# Patient Record
Sex: Female | Born: 1975 | Race: Black or African American | Hispanic: No | Marital: Married | Smoking: Never smoker
Health system: Southern US, Community
[De-identification: ages and names within clinical notes are randomized; demographics above are authoritative.]

## PROBLEM LIST (undated history)

## (undated) DIAGNOSIS — F319 Bipolar disorder, unspecified: Secondary | ICD-10-CM

## (undated) DIAGNOSIS — E119 Type 2 diabetes mellitus without complications: Secondary | ICD-10-CM

## (undated) HISTORY — PX: CERVICAL ABLATION: SHX5771

## (undated) HISTORY — PX: OTHER SURGICAL HISTORY: SHX169

## (undated) HISTORY — PX: DENTAL SURGERY: SHX609

---

## 1997-11-28 ENCOUNTER — Emergency Department (HOSPITAL_COMMUNITY): Admission: EM | Admit: 1997-11-28 | Discharge: 1997-11-28 | Payer: Self-pay

## 2000-08-05 ENCOUNTER — Emergency Department (HOSPITAL_COMMUNITY): Admission: EM | Admit: 2000-08-05 | Discharge: 2000-08-05 | Payer: Self-pay | Admitting: Gastroenterology

## 2000-08-05 ENCOUNTER — Encounter: Payer: Self-pay | Admitting: Emergency Medicine

## 2001-02-16 ENCOUNTER — Other Ambulatory Visit: Admission: RE | Admit: 2001-02-16 | Discharge: 2001-02-16 | Payer: Self-pay | Admitting: Obstetrics and Gynecology

## 2006-08-26 ENCOUNTER — Emergency Department (HOSPITAL_COMMUNITY): Admission: EM | Admit: 2006-08-26 | Discharge: 2006-08-26 | Payer: Self-pay | Admitting: Family Medicine

## 2009-02-24 ENCOUNTER — Emergency Department (HOSPITAL_COMMUNITY): Admission: EM | Admit: 2009-02-24 | Discharge: 2009-02-24 | Payer: Self-pay | Admitting: Emergency Medicine

## 2009-02-25 ENCOUNTER — Emergency Department (HOSPITAL_COMMUNITY): Admission: EM | Admit: 2009-02-25 | Discharge: 2009-02-25 | Payer: Self-pay | Admitting: Emergency Medicine

## 2010-05-05 LAB — URINALYSIS, ROUTINE W REFLEX MICROSCOPIC
Glucose, UA: NEGATIVE mg/dL
Hgb urine dipstick: NEGATIVE
Specific Gravity, Urine: 1.035 — ABNORMAL HIGH (ref 1.005–1.030)
pH: 6 (ref 5.0–8.0)

## 2010-05-05 LAB — DIFFERENTIAL
Basophils Absolute: 0.1 10*3/uL (ref 0.0–0.1)
Basophils Relative: 1 % (ref 0–1)
Eosinophils Absolute: 0 10*3/uL (ref 0.0–0.7)
Eosinophils Relative: 1 % (ref 0–5)
Lymphocytes Relative: 22 % (ref 12–46)
Lymphs Abs: 1.7 10*3/uL (ref 0.7–4.0)
Monocytes Absolute: 0.7 10*3/uL (ref 0.1–1.0)
Monocytes Relative: 9 % (ref 3–12)
Neutro Abs: 5.3 10*3/uL (ref 1.7–7.7)
Neutrophils Relative %: 68 % (ref 43–77)

## 2010-05-05 LAB — BASIC METABOLIC PANEL
BUN: 11 mg/dL (ref 6–23)
CO2: 27 mEq/L (ref 19–32)
Calcium: 9.1 mg/dL (ref 8.4–10.5)
Chloride: 102 mEq/L (ref 96–112)
Creatinine, Ser: 0.67 mg/dL (ref 0.4–1.2)
GFR calc Af Amer: >60 mL/min (ref >60)
GFR calc non Af Amer: >60 mL/min (ref >60)
Glucose, Bld: 105 mg/dL — ABNORMAL HIGH (ref 70–99)
Potassium: 3 mEq/L — ABNORMAL LOW (ref 3.5–5.1)
Sodium: 135 mEq/L (ref 135–145)

## 2010-05-05 LAB — URINE MICROSCOPIC-ADD ON

## 2010-05-05 LAB — CBC
HCT: 38.2 % (ref 36.0–46.0)
Hemoglobin: 12.6 g/dL (ref 12.0–15.0)
MCHC: 33.1 g/dL (ref 30.0–36.0)
MCV: 86.3 fL (ref 78.0–100.0)
Platelets: 285 10*3/uL (ref 150–400)
RBC: 4.43 MIL/uL (ref 3.87–5.11)
RDW: 13.6 % (ref 11.5–15.5)
WBC: 7.8 10*3/uL (ref 4.0–10.5)

## 2010-05-05 LAB — ETHANOL: Alcohol, Ethyl (B): <5 mg/dL (ref 0–10)

## 2010-05-05 LAB — RAPID URINE DRUG SCREEN, HOSP PERFORMED
Barbiturates: NOT DETECTED
Opiates: NOT DETECTED
Tetrahydrocannabinol: NOT DETECTED

## 2013-02-06 ENCOUNTER — Encounter (HOSPITAL_COMMUNITY): Payer: Self-pay | Admitting: Emergency Medicine

## 2013-02-06 ENCOUNTER — Emergency Department (HOSPITAL_COMMUNITY)
Admission: EM | Admit: 2013-02-06 | Discharge: 2013-02-06 | Disposition: A | Discharge status: Home / Self Care | Payer: No Typology Code available for payment source | Attending: Emergency Medicine | Admitting: Emergency Medicine

## 2013-02-06 ENCOUNTER — Emergency Department (HOSPITAL_COMMUNITY): Payer: No Typology Code available for payment source

## 2013-02-06 DIAGNOSIS — M25511 Pain in right shoulder: Secondary | ICD-10-CM

## 2013-02-06 DIAGNOSIS — S4980XA Other specified injuries of shoulder and upper arm, unspecified arm, initial encounter: Secondary | ICD-10-CM | POA: Insufficient documentation

## 2013-02-06 DIAGNOSIS — S46909A Unspecified injury of unspecified muscle, fascia and tendon at shoulder and upper arm level, unspecified arm, initial encounter: Secondary | ICD-10-CM | POA: Insufficient documentation

## 2013-02-06 DIAGNOSIS — Y92410 Unspecified street and highway as the place of occurrence of the external cause: Secondary | ICD-10-CM | POA: Insufficient documentation

## 2013-02-06 DIAGNOSIS — Y9389 Activity, other specified: Secondary | ICD-10-CM | POA: Insufficient documentation

## 2013-02-06 MED ORDER — HYDROCODONE-ACETAMINOPHEN 5-325 MG PO TABS
2.0000 | ORAL_TABLET | Freq: Once | ORAL | Status: AC
  Administered 2013-02-06: 2 via ORAL
  Filled 2013-02-06: qty 2

## 2013-02-06 MED ORDER — HYDROCODONE-ACETAMINOPHEN 5-325 MG PO TABS: 1.0000 | ORAL_TABLET | ORAL | Status: DC | PRN

## 2013-02-06 MED ORDER — CYCLOBENZAPRINE HCL 5 MG PO TABS: 5.0000 mg | ORAL_TABLET | Freq: Two times a day (BID) | ORAL | Status: DC | PRN
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
## 2013-02-11 NOTE — ED Provider Notes (Signed)
Medical screening examination/treatment/procedure(s) were performed by non-physician practitioner and as supervising physician I was immediately available for consultation/collaboration.  EKG Interpretation   None         Christopher J. Pollina, MD 02/11/13 0708 

## 2014-04-14 ENCOUNTER — Other Ambulatory Visit: Payer: Self-pay | Admitting: Obstetrics and Gynecology

## 2014-04-14 DIAGNOSIS — Z1231 Encounter for screening mammogram for malignant neoplasm of breast: Secondary | ICD-10-CM

## 2014-04-21 ENCOUNTER — Ambulatory Visit
Admission: RE | Admit: 2014-04-21 | Discharge: 2014-04-21 | Disposition: A | Discharge status: Home / Self Care | Payer: BC Managed Care – PPO | Source: Ambulatory Visit | Attending: Obstetrics and Gynecology | Admitting: Obstetrics and Gynecology

## 2014-04-21 DIAGNOSIS — Z1231 Encounter for screening mammogram for malignant neoplasm of breast: Secondary | ICD-10-CM

## 2015-09-12 ENCOUNTER — Encounter: Payer: Self-pay | Admitting: *Deleted

## 2015-09-12 ENCOUNTER — Encounter: Payer: BC Managed Care – PPO | Attending: *Deleted | Admitting: *Deleted

## 2015-09-12 DIAGNOSIS — E119 Type 2 diabetes mellitus without complications: Secondary | ICD-10-CM | POA: Insufficient documentation

## 2015-09-12 NOTE — Patient Instructions (Signed)
Plan:  Aim for 2 Carb Choices per meal (30 grams) +/- 1 either way  Aim for 0-1 Carbs per snack if hungry  Include protein in moderation with your meals and snacks Consider reading food labels for Total Carbohydrate of foods Continue with your activity level as tolerated Consider checking BG at alternate times per day if you choose to pursue that

## 2015-09-14 NOTE — Progress Notes (Signed)
Diabetes Self-Management Education  Visit Type: First/Initial  Appt. Start Time: 1000 Appt. End Time: 1130  09/14/2015  Laura Thompson, identified by name and date of birth, is a 40 y.o. female with a diagnosis of Diabetes: Type 2. She is newly diagnosed with DM 2. Had recent weight gain with birh control method over the past year. Since diagnoxed, she has increased her vegetable intake and is trying to eat healthier. She is also not eating out as frequently and has increased her activity level.  ASSESSMENT  Height  (1.6 m), weight 193 lb 6.4 oz (87.7 kg). Body mass index is 34.26 kg/m.      Diabetes Self-Management Education - 09/12/15 1201      Visit Information   Visit Type First/Initial     Initial Visit   Diabetes Type Type 2   Are you currently following a meal plan? Yes   What type of meal plan do you follow? better choices   Are you taking your medications as prescribed? Not on Medications   Date Diagnosed May, 2017     Health Coping   How would you rate your overall health? Good     Psychosocial Assessment   Patient Belief/Attitude about Diabetes Afraid   Self-care barriers None   Self-management support Family   Other persons present Patient   Special Needs None   Preferred Learning Style Auditory;Visual;Hands on   Learning Readiness Change in progress   How often do you need to have someone help you when you read instructions, pamphlets, or other written materials from your doctor or pharmacy? 1 - Never   What is the last grade level you completed in school? college     Pre-Education Assessment   Patient understands the diabetes disease and treatment process. Needs Instruction   Patient understands incorporating nutritional management into lifestyle. Needs Instruction   Patient undertands incorporating physical activity into lifestyle. Needs Instruction   Patient understands using medications safely. Needs Instruction   Patient understands  monitoring blood glucose, interpreting and using results Needs Instruction   Patient understands prevention, detection, and treatment of acute complications. Needs Instruction   Patient understands prevention, detection, and treatment of chronic complications. Needs Instruction   Patient understands how to develop strategies to address psychosocial issues. Needs Instruction   Patient understands how to develop strategies to promote health/change behavior. Needs Instruction     Complications   Last HgB A1C per patient/outside source 7.3 %   How often do you check your blood sugar? Not recommended by provider   Have you had a dilated eye exam in the past 12 months? No   Have you had a dental exam in the past 12 months? Yes   Are you checking your feet? No     Dietary Intake   Breakfast oatmeal with almond milk   Snack (morning) nuts, fresh fruit   Lunch hot meal, lean meat, vegetables, occasional starch   Snack (afternoon) nuts   Dinner salad occasionally with meat, vegetable plate   Beverage(s) water     Exercise   Exercise Type Light (walking / raking leaves)   How many days per week to you exercise? 3.5   How many minutes per day do you exercise? 45   Total minutes per week of exercise 157.5     Patient Education   Previous Diabetes Education No   Disease state  Definition of diabetes, type 1 and 2, and the diagnosis of diabetes   Nutrition management  Role of diet in the treatment of diabetes and the relationship between the three main macronutrients and blood glucose level;Food label reading, portion sizes and measuring food.;Carbohydrate counting   Physical activity and exercise  Role of exercise on diabetes management, blood pressure control and cardiac health.   Monitoring Purpose and frequency of SMBG.;Identified appropriate SMBG and/or A1C goals.   Chronic complications Relationship between chronic complications and blood glucose control     Individualized Goals (developed  by patient)   Nutrition Follow meal plan discussed   Physical Activity Exercise 3-5 times per week   Medications Not Applicable   Monitoring  Not Applicable     Post-Education Assessment   Patient understands the diabetes disease and treatment process. Demonstrates understanding / competency   Patient understands incorporating nutritional management into lifestyle. Demonstrates understanding / competency   Patient undertands incorporating physical activity into lifestyle. Demonstrates understanding / competency   Patient understands monitoring blood glucose, interpreting and using results --  not ready to start testing yet   Patient understands prevention, detection, and treatment of acute complications. --  NA   Patient understands prevention, detection, and treatment of chronic complications. Demonstrates understanding / competency   Patient understands how to develop strategies to address psychosocial issues. Demonstrates understanding / competency     Outcomes   Expected Outcomes Demonstrated interest in learning. Expect positive outcomes   Future DMSE PRN   Program Status Completed      Individualized Plan for Diabetes Self-Management Training:   Learning Objective:  Patient will have a greater understanding of diabetes self-management. Patient education plan is to attend individual and/or group sessions per assessed needs and concerns.   Plan:   Patient Instructions  Plan:  Aim for 2 Carb Choices per meal (30 grams) +/- 1 either way  Aim for 0-1 Carbs per snack if hungry  Include protein in moderation with your meals and snacks Consider reading food labels for Total Carbohydrate of foods Continue with your activity level as tolerated Consider checking BG at alternate times per day if you choose to pursue that       Expected Outcomes:  Demonstrated interest in learning. Expect positive outcomes  Education material provided: Living Well with Diabetes, A1C conversion  sheet, Meal plan card and Carbohydrate counting sheet  If problems or questions, patient to contact team via:  Phone and Email  Future DSME appointment: PRN

## 2015-10-15 ENCOUNTER — Encounter: Payer: Self-pay | Admitting: Physician Assistant

## 2016-06-25 MED FILL — LORYNA 3-0.02 MG TAB: 3-0.02 | 84 days supply | Qty: 84 | Fill #0

## 2016-07-30 ENCOUNTER — Ambulatory Visit (HOSPITAL_COMMUNITY)
Admission: EM | Admit: 2016-07-30 | Discharge: 2016-07-30 | Disposition: A | Discharge status: Home / Self Care | Payer: Self-pay | Attending: Family Medicine | Admitting: Family Medicine

## 2016-07-30 ENCOUNTER — Encounter (HOSPITAL_COMMUNITY): Payer: Self-pay | Admitting: Emergency Medicine

## 2016-07-30 DIAGNOSIS — J302 Other seasonal allergic rhinitis: Secondary | ICD-10-CM

## 2016-07-30 DIAGNOSIS — B9789 Other viral agents as the cause of diseases classified elsewhere: Secondary | ICD-10-CM

## 2016-07-30 DIAGNOSIS — J069 Acute upper respiratory infection, unspecified: Secondary | ICD-10-CM

## 2016-07-30 MED ORDER — DM-GUAIFENESIN ER 30-600 MG PO TB12: 1.0000 | ORAL_TABLET | Freq: Two times a day (BID) | ORAL | 0 refills | Status: DC

## 2016-07-30 MED ORDER — FLUTICASONE PROPIONATE 50 MCG/ACT NA SUSP: 1.0000 | Freq: Every day | NASAL | 1 refills | Status: DC

## 2016-07-30 MED ORDER — FEXOFENADINE HCL 60 MG PO TABS: 180.0000 mg | ORAL_TABLET | Freq: Every day | ORAL | 0 refills | Status: DC

## 2016-07-30 MED FILL — FLUTICASONE PROP 50 MCG SPR: 50 | 60 days supply | Qty: 16 | Fill #0

## 2016-07-30 NOTE — Discharge Instructions (Signed)
Nasal saline rinse as advised

## 2016-07-30 NOTE — ED Triage Notes (Signed)
[facility] a [facility] of [facility] sore throat, cough and sinus pain and pressure x 2 days.

## 2016-07-30 NOTE — ED Provider Notes (Signed)
CSN: [id number]     Arrival date & time 07/30/16  1631 History   None    Chief Complaint  Patient presents with  . Cough   (Consider location/radiation/quality/duration/timing/severity/associated sxs/prior Treatment) The history is provided by the patient.  Cough  Cough characteristics:  Productive Sputum characteristics:  Yellow and clear Severity:  Moderate Timing:  Intermittent Associated symptoms: rhinorrhea, sinus congestion and sore throat   : 41 y/o female presented with CC nasal congestion, cough (productive), post nasal drip, sore throat and sneezing x 3 days. Pt denies fever/chills/SOB.   History reviewed. No pertinent past medical history. History reviewed. No pertinent surgical history. History reviewed. No pertinent family history. Social History  Substance Use Topics  . Smoking status: Never Smoker  . Smokeless tobacco: Never Used  . Alcohol use No   OB History    No data available     Review of Systems  Constitutional: Negative.   HENT: Positive for postnasal drip, rhinorrhea, sinus pressure and sore throat.   Respiratory: Positive for cough.     Allergies  Doxycycline; Iodinated diagnostic agents; Septra [sulfamethoxazole-trimethoprim]; and Sulfa antibiotics  Home Medications   Prior to Admission medications   Medication Sig Start Date End Date Taking? Authorizing Provider  norelgestromin-ethinyl estradiol (ORTHO EVRA) 150-35 MCG/24HR transdermal patch Place 1 patch onto the skin once a week.   Yes [provider]  dextromethorphan-guaiFENesin (MUCINEX DM) 30-600 MG 12hr tablet Take 1 tablet by mouth 2 (two) times daily. 07/30/16   Jemiah Ellenburg, NP  fexofenadine (ALLEGRA) 60 MG tablet Take 3 tablets (180 mg total) by mouth daily. 07/30/16   Female Iafrate, NP  fluticasone (FLONASE ALLERGY RELIEF) 50 MCG/ACT nasal spray Place 1 spray into both nostrils daily. 07/30/16   Shadeed Colberg, NP   Meds Ordered and Administered this Visit   Medications - No data to display  BP 113/74 (BP Location: Right Arm)   Pulse 97   Temp 98.2 F (36.8 C) (Oral)   Resp 18   SpO2 99%  No data found.   Physical Exam  Constitutional: She appears well-developed and well-nourished. No distress.  HENT:  Head: Normocephalic.  Right Ear: External ear normal.  Left Ear: External ear normal.  Nose: Mucosal edema present.  Mouth/Throat: Oropharynx is clear and moist.  B/L nasal mucosal inflammation with inflamed turbinates. +ve post nasal drip  Eyes: Pupils are equal, round, and reactive to light.  Neck: Normal range of motion.  Cardiovascular: Normal rate and regular rhythm.   Pulmonary/Chest: Effort normal and breath sounds normal.  Skin: Skin is warm.  Psychiatric: She has a normal mood and affect.    Urgent Care Course     Procedures (including critical care time)  Labs Review Labs Reviewed - No data to display  Imaging Review No results found.   Visual Acuity Review  Right Eye Distance:   Left Eye Distance:   Bilateral Distance:    Right Eye Near:   Left Eye Near:    Bilateral Near:         MDM   1. Viral URI with cough   2. Seasonal allergic rhinitis, unspecified trigger   Sx highly likely of viral cause with superimposed allergies. Advised to start Rx as advised and saline nasal rinse OTC BID    Samarion Ehle, NP 07/30/16 1727

## 2016-12-15 MED FILL — DROSPIR-ETH ESTRA 3/.02 MG: 3-0.02 | 28 days supply | Qty: 28 | Fill #1

## 2017-01-13 MED FILL — DROSPIR-ETH ESTRA 3/.02 MG: 3-0.02 | 28 days supply | Qty: 28 | Fill #2

## 2017-02-13 MED FILL — DROSPIR-ETH ESTRA 3/.02 MG: 3-0.02 | 28 days supply | Qty: 28 | Fill #3

## 2017-03-09 ENCOUNTER — Other Ambulatory Visit: Payer: Self-pay

## 2017-03-09 ENCOUNTER — Emergency Department (HOSPITAL_COMMUNITY)
Admission: EM | Admit: 2017-03-09 | Discharge: 2017-03-10 | Disposition: A | Discharge status: Other Acute Inpatient Hospital | Payer: Federal, State, Local not specified - Other | Attending: Emergency Medicine | Admitting: Emergency Medicine

## 2017-03-09 ENCOUNTER — Encounter (HOSPITAL_COMMUNITY): Payer: Self-pay | Admitting: Nurse Practitioner

## 2017-03-09 ENCOUNTER — Ambulatory Visit (HOSPITAL_COMMUNITY)
Admission: EM | Admit: 2017-03-09 | Discharge: 2017-03-09 | Disposition: A | Discharge status: Home / Self Care | Payer: BC Managed Care – PPO

## 2017-03-09 DIAGNOSIS — F312 Bipolar disorder, current episode manic severe with psychotic features: Secondary | ICD-10-CM

## 2017-03-09 DIAGNOSIS — Z79899 Other long term (current) drug therapy: Secondary | ICD-10-CM | POA: Insufficient documentation

## 2017-03-09 DIAGNOSIS — F309 Manic episode, unspecified: Secondary | ICD-10-CM

## 2017-03-09 LAB — I-STAT BETA HCG BLOOD, ED (MC, WL, AP ONLY)

## 2017-03-09 LAB — COMPREHENSIVE METABOLIC PANEL
ALBUMIN: 3.9 g/dL (ref 3.5–5.0)
ALT: 21 U/L (ref 14–54)
AST: 26 U/L (ref 15–41)
Alkaline Phosphatase: 46 U/L (ref 38–126)
Anion gap: 9 (ref 5–15)
BUN: 8 mg/dL (ref 6–20)
CHLORIDE: 103 mmol/L (ref 101–111)
CO2: 23 mmol/L (ref 22–32)
Calcium: 9.3 mg/dL (ref 8.9–10.3)
Creatinine, Ser: 0.62 mg/dL (ref 0.44–1.00)
GFR calc Af Amer: >60 mL/min (ref >60)
GFR calc non Af Amer: >60 mL/min (ref >60)
GLUCOSE: 159 mg/dL — AB (ref 65–99)
POTASSIUM: 3 mmol/L — AB (ref 3.5–5.1)
SODIUM: 135 mmol/L (ref 135–145)
Total Bilirubin: 1.1 mg/dL (ref 0.3–1.2)
Total Protein: 7.8 g/dL (ref 6.5–8.1)

## 2017-03-09 LAB — RAPID URINE DRUG SCREEN, HOSP PERFORMED
Amphetamines: NOT DETECTED
BARBITURATES: NOT DETECTED
BENZODIAZEPINES: NOT DETECTED
COCAINE: NOT DETECTED
Opiates: NOT DETECTED
Tetrahydrocannabinol: NOT DETECTED

## 2017-03-09 LAB — CBC
HCT: 37.9 % (ref 36.0–46.0)
Hemoglobin: 12.4 g/dL (ref 12.0–15.0)
MCH: 26.7 pg (ref 26.0–34.0)
MCHC: 32.7 g/dL (ref 30.0–36.0)
MCV: 81.7 fL (ref 78.0–100.0)
PLATELETS: 339 10*3/uL (ref 150–400)
RBC: 4.64 MIL/uL (ref 3.87–5.11)
RDW: 13.4 % (ref 11.5–15.5)
WBC: 5.8 10*3/uL (ref 4.0–10.5)

## 2017-03-09 LAB — ETHANOL: Alcohol, Ethyl (B): <10 mg/dL (ref <10)

## 2017-03-09 MED ORDER — IBUPROFEN 200 MG PO TABS: 600.0000 mg | ORAL_TABLET | Freq: Three times a day (TID) | ORAL | Status: DC | PRN

## 2017-03-09 MED ORDER — OLANZAPINE 10 MG PO TBDP
10.0000 mg | ORAL_TABLET | Freq: Once | ORAL | Status: AC
  Administered 2017-03-09: 10 mg via ORAL
  Filled 2017-03-09: qty 1

## 2017-03-09 MED ORDER — POTASSIUM CHLORIDE CRYS ER 20 MEQ PO TBCR
40.0000 meq | EXTENDED_RELEASE_TABLET | Freq: Every day | ORAL | Status: DC
  Administered 2017-03-09 – 2017-03-10 (×2): 40 meq via ORAL
  Filled 2017-03-09 (×2): qty 2

## 2017-03-09 MED ORDER — ONDANSETRON HCL 4 MG PO TABS: 4.0000 mg | ORAL_TABLET | Freq: Three times a day (TID) | ORAL | Status: DC | PRN

## 2017-03-09 NOTE — ED Provider Notes (Signed)
[facility]-EMERGENCY DEPT Provider Note   CSN: [id number] Arrival date & time: 03/09/17  1111     History   Chief Complaint No chief complaint on file.  Level V caveat: mental health illness HPI Laura Thompson is a 42 y.o. female.  HPI 42 yo female with a hx of bipolar disorder not on meds presents with several days with increased manic behavior, rapid speech, poor sleep. Brought to ER by her husband. No new meds. No other complaints History reviewed. No pertinent past medical history.  There are no active problems to display for this patient.   History reviewed. No pertinent surgical history.  OB History    No data available       Home Medications    Prior to Admission medications   Medication Sig Start Date End Date Taking? Authorizing Provider  dextromethorphan-guaiFENesin (MUCINEX DM) 30-600 MG 12hr tablet Take 1 tablet by mouth 2 (two) times daily. 07/30/16   Multani, Bhupinder, NP  fexofenadine (ALLEGRA) 60 MG tablet Take 3 tablets (180 mg total) by mouth daily. 07/30/16   Multani, Bhupinder, NP  fluticasone (FLONASE ALLERGY RELIEF) 50 MCG/ACT nasal spray Place 1 spray into both nostrils daily. 07/30/16   Multani, Bhupinder, NP  norelgestromin-ethinyl estradiol (ORTHO EVRA) 150-35 MCG/24HR transdermal patch Place 1 patch onto the skin once a week.    [provider]    Family History History reviewed. No pertinent family history.  Social History Social History   Tobacco Use  . Smoking status: Never Smoker  . Smokeless tobacco: Never Used  Substance Use Topics  . Alcohol use: No  . Drug use: No     Allergies   Doxycycline; Iodinated diagnostic agents; Septra [sulfamethoxazole-trimethoprim]; and Sulfa antibiotics   Review of Systems Review of Systems  Unable to perform ROS: Psychiatric disorder     Physical Exam Updated Vital Signs BP (!) 142/93   Pulse 88   Temp 97.9 F (36.6 C) (Oral)   Resp 18   Ht 5' 3.5"  (1.613 m)   Wt 81.6 kg (180 lb)   SpO2 100%   BMI 31.39 kg/m   Physical Exam  Constitutional: She is oriented to person, place, and time. She appears well-developed and well-nourished.  HENT:  Head: Normocephalic.  Eyes: EOM are normal.  Neck: Normal range of motion.  Cardiovascular: Normal rate.  Pulmonary/Chest: Effort normal and breath sounds normal.  Abdominal: She exhibits no distension.  Musculoskeletal: Normal range of motion.  Neurological: She is alert and oriented to person, place, and time.  Psychiatric: Her affect is blunt. Her speech is rapid and/or pressured. She is hyperactive. Thought content is not paranoid. Cognition and memory are impaired. She expresses no homicidal and no suicidal ideation. She is inattentive.  Nursing note and vitals reviewed.    ED Treatments / Results  Labs (all labs ordered are listed, but only abnormal results are displayed) Labs Reviewed  COMPREHENSIVE METABOLIC PANEL - Abnormal; Notable for the following components:      Result Value   Potassium 3.0 (*)    Glucose, Bld 159 (*)    All other components within normal limits  CBC  ETHANOL  RAPID URINE DRUG SCREEN, HOSP PERFORMED  I-STAT BETA HCG BLOOD, ED ([facility], [facility], [facility] ONLY)    EKG  EKG Interpretation None       Radiology No results found.  Procedures Procedures (including critical care time)  Medications Ordered in ED Medications  ibuprofen (ADVIL,MOTRIN) tablet 600 mg (not administered)  ondansetron (ZOFRAN) tablet 4 mg (not administered)  OLANZapine zydis (ZYPREXA) disintegrating tablet 10 mg (10 mg Oral Given 03/09/17 1304)     Initial Impression / Assessment and Plan / ED Course  I have reviewed the triage vital signs and the nursing notes.  Pertinent labs & imaging results that were available during my care of the patient were reviewed by me and considered in my medical decision making (see chart for details).     Medically clear. TTS to evaluate. Oral K  replacement.  hypoK unrelated to her acute mental health illlness  Final Clinical Impressions(s) / ED Diagnoses   Final diagnoses:  None    ED Discharge Orders    None       Azalia Bilisampos, Tymarion Everard, MD 03/09/17 1601

## 2017-03-09 NOTE — BH Assessment (Addendum)
[facility] Assessment Progress Note Sharma CovertNorman MD evaluated patient and recommended a inpatient admission. IVC to be initiated.

## 2017-03-09 NOTE — ED Notes (Signed)
ED Provider at bedside. 

## 2017-03-09 NOTE — BH Assessment (Signed)
[facility] Assessment Progress Note  Laura CovertNorman MD evaluated patient and recommended a inpatient admission. IVC pending.

## 2017-03-09 NOTE — BH Assessment (Addendum)
Assessment Note  Laura Thompson is an 42 y.o. female that presents this date with symptoms associated with a Bipolar episode as patient is observed to be manic and very liable. Patient is unable to render a accurate history due to current mental health state. Patient is pacing around the room and dancing on her bed. Patient can be redirected with prompts but quickly reverts back to manic state and is observed to be laughing then crying as this Clinical research associate attempts to conduct assessment. Patient's husband is present (with patient's permission) Laura Thompson who assists with rendering collateral information. Patient states she recently presented to a local Urgent Care last Sunday 03/07/17 and was prescribed medications for plantar fasciitis that husband feels triggered this current manic episode. Patient states she was prescribed Tramadol  and Prednisone although patient renders conflicting history on when she last took those medications. Patient's husband is also unsure when patient last took those medications. Patient states she was diagnosed with being Bipolar seven years ago at [facility] but was transferred to another facility where she received medication management for two weeks. Patient is unclear who that provider was or what medications she received. Patient states she did not follow up with aftercare or medication interventions and has not had a manic episode until yesterday when symptoms escalated into current state. Patient denies any current/past history of SA use. Patient is oriented to time/place and denies any AVH. Patient denies any S/I, H/I or AVH. Patient denies any thoughts of self harm or prior attempts/gestures at self injurious behaviors. Patient is agreeing to be monitored overnight and is requesting medication management to assist with current symptoms. Patient states she has not slept in the last 24 hours. Patient states she "is just staying over night and she might change her mind." Per  admission notes, patient brought in by husband for current manic state. Patient is not under the care of a current provider. Patient is not on any medications for her Bipolar disorder. Patient has not ate in 1 day. Patients is talking rapid and incoherently at times. Patient states she "cant control her thoughts. She is tearful and aggressive. Sharma Covert MD evaluated patient and recommended a inpatient admission. IVC to be initiated.      Diagnosis: F31.1 Bipolar disorder, current episode manic without psychotic features.   Past Medical History: History reviewed. No pertinent past medical history.  History reviewed. No pertinent surgical history.  Family History: History reviewed. No pertinent family history.  Social History:  reports that  has never smoked. she has never used smokeless tobacco. She reports that she does not drink alcohol or use drugs.  Additional Social History:  Alcohol / Drug Use Pain Medications: See MAR Prescriptions: See MAR Over the Counter: See MAR History of alcohol / drug use?: No history of alcohol / drug abuse Longest period of sobriety (when/how long): (Denies) Negative Consequences of Use: (Denies) Withdrawal Symptoms: (Denies)  CIWA: CIWA-Ar BP: (!) 142/93 Pulse Rate: 88 COWS:    Allergies:  Allergies  Allergen Reactions  . Doxycycline   . Iodinated Diagnostic Agents   . Septra [Sulfamethoxazole-Trimethoprim]   . Sulfa Antibiotics     Home Medications:  (Not in a hospital admission)  OB/GYN Status:  No LMP recorded. Patient is not currently having periods (Reason: Other).  General Assessment Data [facility] of Assessment: [facility] TTS Assessment: In system Is this a Tele or Face-to-Face Assessment?: Face-to-Face Is this an Initial Assessment or a Re-assessment for this encounter?: Initial Assessment Marital status: Married  Maiden name: NA Is patient pregnant?: No Pregnancy Status: No Living Arrangements: Spouse/significant other Can pt return  to current living arrangement?: Yes Admission Status: Voluntary Is patient capable of signing voluntary admission?: Yes Referral Source: Self/Family/Friend Insurance type: Self Pay  Medical Screening Exam [facility] Walk-in ONLY) Medical Exam completed: Yes  Crisis Care Plan Living Arrangements: Spouse/significant other Legal Guardian: (NA) Name of Psychiatrist: None Name of Therapist: None  Education Status Is patient currently in school?: No Current Grade: (NA) 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 : No Suicidal Intent: No Has patient had any suicidal intent within the past 6 months prior to admission? : No Is patient at risk for suicide?: No Suicidal Plan?: No Has patient had any suicidal plan within the past 6 months prior to admission? : No Access to Means: No What has been your use of drugs/alcohol within the last 12 months?: Denies Previous Attempts/Gestures: Yes How many times?: 1 Other Self Harm Risks: NA Triggers for Past Attempts: Unknown Intentional Self Injurious Behavior: None Family Suicide History: No Recent stressful life event(s): Other (Comment)(MH issues being off medications) Persecutory voices/beliefs?: No Depression: No Depression Symptoms: (NA) Substance abuse history and/or treatment for substance abuse?: No Suicide prevention information given to non-admitted patients: Not applicable  Risk to Others within the past 6 months Homicidal Ideation: No Does patient have any lifetime risk of violence toward others beyond the six months prior to admission? : No Thoughts of Harm to Others: No Current Homicidal Intent: No Current Homicidal Plan: No Access to Homicidal Means: No Identified Victim: NA History of harm to others?: No Assessment of Violence: None  Noted Violent Behavior Description: NA Does patient have access to weapons?: No Criminal Charges Pending?: No Does patient have a court date: No Is patient on probation?: No  Psychosis Hallucinations: None noted Delusions: None noted  Mental Status Report Appearance/Hygiene: In scrubs Eye Contact: Fair Motor Activity: Agitation Speech: Pressured, Loud Level of Consciousness: Restless, Combative, Irritable Mood: Anxious, Angry Affect: Angry, Anxious Anxiety Level: Severe Thought Processes: Tangential Judgement: Unimpaired Orientation: Person, Place, Time Obsessive Compulsive Thoughts/Behaviors: None  Cognitive Functioning Concentration: Decreased Memory: Recent Intact IQ: Average Insight: Poor Impulse Control: Poor Appetite: Fair Weight Loss: 0 Weight Gain: 0 Sleep: Decreased Total Hours of Sleep: (Pt has not slept in 24 hours) Vegetative Symptoms: None  ADLScreening [facility] Assessment Services) Patient's cognitive ability adequate to safely complete daily activities?: Yes Patient able to express need for assistance with ADLs?: Yes Independently performs ADLs?: Yes (appropriate for developmental age)  Prior Inpatient Therapy Prior Inpatient Therapy: Yes Prior Therapy Dates: 2011 Prior [facility]Provider(s): [facility]  Reason for Treatment: MH issues  Prior Outpatient Therapy Prior Outpatient Therapy: No Prior Therapy Dates: NA Prior Therapy Facilty/Provider(s): NA Reason for Treatment: NA Does patient have an ACCT team?: No Does patient have Intensive In-House Services?  : No Does patient have [facility] services? : No Does patient have P4CC services?: No  ADL Screening (condition at time of admission) Patient's cognitive ability adequate to safely complete daily activities?: Yes Is the patient deaf or have difficulty hearing?: No Does the patient have difficulty seeing, even when wearing glasses/contacts?: No Does the patient have difficulty  concentrating, remembering, or making decisions?: No Patient able to express need for assistance with ADLs?: Yes Does  the patient have difficulty dressing or bathing?: No Independently performs ADLs?: Yes (appropriate for developmental age) Does the patient have difficulty walking or climbing stairs?: No Weakness of Legs: None Weakness of Arms/Hands: None  Home Assistive Devices/Equipment Home Assistive Devices/Equipment: None  Therapy Consults (therapy consults require a physician order) PT Evaluation Needed: No OT Evalulation Needed: No SLP Evaluation Needed: No Abuse/Neglect Assessment (Assessment to be complete while patient is alone) Physical Abuse: Denies Verbal Abuse: Denies Sexual Abuse: Denies Exploitation of patient/patient's resources: Denies Self-Neglect: Denies Values / Beliefs Cultural Requests During Hospitalization: None Spiritual Requests During Hospitalization: None Consults Spiritual Care Consult Needed: No Social Work Consult Needed: No Merchant navy officerAdvance Directives (For Healthcare) Does Patient Have a Medical Advance Directive?: No Would patient like information on creating a medical advance directive?: No - Patient declined    Additional Information 1:1 In Past 12 Months?: No CIRT Risk: No Elopement Risk: No Does patient have medical clearance?: Yes     Disposition: Sharma CovertNorman MD evaluated patient and recommended a inpatient admission. IVC pending.    Disposition Initial Assessment Completed for this Encounter: Yes Disposition of Patient: Other dispositions Other disposition(s): (Patient will be monitored and observed)  On Site Evaluation by:   Reviewed with Physician:    Alfredia Fergusonavid L Lilyonna Steidle 03/09/2017 2:10 PM

## 2017-03-09 NOTE — ED Triage Notes (Signed)
Patient brought in by husband for and altered manic state. Patient is not under the care of a psych MD. This has happened before she feels manic and has not slept in days. Patient is not on any medications for her bipolar disorder. Patient has not ate in 1 day. Patients is talking rapid and word salad. Cant control her thoughts. She is tearful and aggressive.

## 2017-03-09 NOTE — ED Notes (Signed)
Bed: WA29 Expected date:  Expected time:  Means of arrival:  Comments: 

## 2017-03-10 ENCOUNTER — Inpatient Hospital Stay (HOSPITAL_COMMUNITY)
Admission: AD | Admit: 2017-03-10 | Discharge: 2017-03-13 | DRG: 885 | Disposition: A | Discharge status: Home / Self Care | Payer: Federal, State, Local not specified - Other | Attending: Psychiatry | Admitting: Psychiatry

## 2017-03-10 ENCOUNTER — Encounter (HOSPITAL_COMMUNITY): Payer: Self-pay | Admitting: *Deleted

## 2017-03-10 ENCOUNTER — Other Ambulatory Visit: Payer: Self-pay

## 2017-03-10 DIAGNOSIS — Z882 Allergy status to sulfonamides status: Secondary | ICD-10-CM | POA: Diagnosis not present

## 2017-03-10 DIAGNOSIS — Z23 Encounter for immunization: Secondary | ICD-10-CM | POA: Diagnosis not present

## 2017-03-10 DIAGNOSIS — G4700 Insomnia, unspecified: Secondary | ICD-10-CM

## 2017-03-10 DIAGNOSIS — R4587 Impulsiveness: Secondary | ICD-10-CM

## 2017-03-10 DIAGNOSIS — F419 Anxiety disorder, unspecified: Secondary | ICD-10-CM | POA: Diagnosis present

## 2017-03-10 DIAGNOSIS — F312 Bipolar disorder, current episode manic severe with psychotic features: Secondary | ICD-10-CM

## 2017-03-10 DIAGNOSIS — Z91041 Radiographic dye allergy status: Secondary | ICD-10-CM | POA: Diagnosis not present

## 2017-03-10 DIAGNOSIS — F3113 Bipolar disorder, current episode manic without psychotic features, severe: Secondary | ICD-10-CM | POA: Diagnosis not present

## 2017-03-10 DIAGNOSIS — Z79899 Other long term (current) drug therapy: Secondary | ICD-10-CM

## 2017-03-10 DIAGNOSIS — Z888 Allergy status to other drugs, medicaments and biological substances status: Secondary | ICD-10-CM

## 2017-03-10 DIAGNOSIS — E119 Type 2 diabetes mellitus without complications: Secondary | ICD-10-CM | POA: Diagnosis present

## 2017-03-10 DIAGNOSIS — F3110 Bipolar disorder, current episode manic without psychotic features, unspecified: Secondary | ICD-10-CM | POA: Diagnosis present

## 2017-03-10 HISTORY — DX: Type 2 diabetes mellitus without complications: E11.9

## 2017-03-10 MED ORDER — MAGNESIUM HYDROXIDE 400 MG/5ML PO SUSP
30.0000 mL | Freq: Every day | ORAL | Status: DC | PRN
  Administered 2017-03-11: 30 mL via ORAL
  Filled 2017-03-10: qty 30

## 2017-03-10 MED ORDER — ALUM & MAG HYDROXIDE-SIMETH 200-200-20 MG/5ML PO SUSP: 30.0000 mL | ORAL | Status: DC | PRN

## 2017-03-10 MED ORDER — POTASSIUM CHLORIDE CRYS ER 20 MEQ PO TBCR
40.0000 meq | EXTENDED_RELEASE_TABLET | Freq: Every day | ORAL | Status: AC
  Administered 2017-03-11 – 2017-03-13 (×3): 40 meq via ORAL
  Filled 2017-03-10 (×4): qty 2

## 2017-03-10 MED ORDER — HYDROXYZINE HCL 25 MG PO TABS
25.0000 mg | ORAL_TABLET | Freq: Three times a day (TID) | ORAL | Status: DC
  Administered 2017-03-10: 25 mg via ORAL
  Filled 2017-03-10: qty 1

## 2017-03-10 MED ORDER — OLANZAPINE 5 MG PO TBDP
5.0000 mg | ORAL_TABLET | Freq: Every day | ORAL | Status: DC
  Administered 2017-03-10: 5 mg via ORAL
  Filled 2017-03-10: qty 1

## 2017-03-10 MED ORDER — ACETAMINOPHEN 325 MG PO TABS
650.0000 mg | ORAL_TABLET | Freq: Four times a day (QID) | ORAL | Status: DC | PRN
  Administered 2017-03-11: 650 mg via ORAL
  Filled 2017-03-10: qty 2

## 2017-03-10 MED ORDER — TRAZODONE HCL 50 MG PO TABS
50.0000 mg | ORAL_TABLET | Freq: Every day | ORAL | Status: DC
  Administered 2017-03-10 – 2017-03-11 (×2): 50 mg via ORAL
  Filled 2017-03-10 (×4): qty 1

## 2017-03-10 MED ORDER — OLANZAPINE 5 MG PO TBDP
5.0000 mg | ORAL_TABLET | Freq: Two times a day (BID) | ORAL | Status: DC
  Administered 2017-03-11: 5 mg via ORAL
  Filled 2017-03-10 (×7): qty 1

## 2017-03-10 MED ORDER — INFLUENZA VAC SPLIT QUAD 0.5 ML IM SUSY
0.5000 mL | PREFILLED_SYRINGE | INTRAMUSCULAR | Status: AC
  Administered 2017-03-11: 0.5 mL via INTRAMUSCULAR
  Filled 2017-03-10: qty 0.5

## 2017-03-10 MED ORDER — OLANZAPINE 5 MG PO TBDP: 5.0000 mg | ORAL_TABLET | Freq: Two times a day (BID) | ORAL | Status: DC

## 2017-03-10 MED ORDER — HYDROXYZINE HCL 25 MG PO TABS
25.0000 mg | ORAL_TABLET | Freq: Three times a day (TID) | ORAL | Status: DC | PRN
  Administered 2017-03-10: 25 mg via ORAL
  Filled 2017-03-10: qty 1

## 2017-03-10 MED ORDER — PNEUMOCOCCAL VAC POLYVALENT 25 MCG/0.5ML IJ INJ
0.5000 mL | INJECTION | INTRAMUSCULAR | Status: AC
  Administered 2017-03-11: 0.5 mL via INTRAMUSCULAR

## 2017-03-10 NOTE — BH Assessment (Signed)
[facility]  Per Juanetta BeetsJacqueline Norman, DO, this pt requires psychiatric hospitalization.  Malva LimesLinsey Strader, RN, [facility]; [facility] will be ready to receive pt at 20:30.  Pt presents under IVC initiated by Dr Sharma CovertNorman, and [facility].  Pt's nurse, Donnal DebarRandi, has been notified, and agrees to call report to [phone].  Pt is to be transported via [employer].   Doylene Canninghomas Suly Vukelich, [place] Behavioral Health Coordinator [phone]

## 2017-03-10 NOTE — ED Notes (Signed)
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 She went to bed to watch TV.

## 2017-03-10 NOTE — ED Notes (Signed)
Report given to SAPPU  

## 2017-03-10 NOTE — Consult Note (Signed)
[facility] Face-to-Face Psychiatry Consult   Reason for Consult:  Manic symptoms Referring Physician: EDP Patient Identification: Laura Thompson MRN:  [id number] Principal Diagnosis: Bipolar disorder, current episode manic severe with psychotic features ([place]) Diagnosis:  There are no active problems to display for this patient.   Total Time spent with patient: 45 minutes  Subjective:   Laura Thompson is a 42 y.o. female patient admitted with manic symptoms.  HPI:   Laura Thompson was IVC'd yesterday for manic symptoms on admission. Her husband reports that she has not been sleeping and she has been talking to herself. She has also appeared disoriented. She reports racing thoughts. She is labile in affect and has pressured speech. During her TTS evaluation she was noted to be dancing on her bed and pacing the room. Nursing reports that she has been disorganized today. She made multiple phone calls and was back and forth in her room to get 7 cups of water. She has poor insight about her illness. She was diagnosed with bipolar disorder 7 years ago and this is the last time she took psychotropic medications. She was recently prescribed Prednisone and Tramadol for plantar fasciitis. She received Zyprexa overnight and denies side effects. She reports good effect for sleep.   Past Psychiatric History: Bipolar disorder   Risk to Self: Suicidal Ideation: No Suicidal Intent: No Is patient at risk for suicide?: No Suicidal Plan?: No Access to Means: No What has been your use of drugs/alcohol within the last 12 months?: Denies How many times?: 1 Other Self Harm Risks: NA Triggers for Past Attempts: Unknown Intentional Self Injurious Behavior: None Risk to Others: Homicidal Ideation: No Thoughts of Harm to Others: No Current Homicidal Intent: No Current Homicidal Plan: No Access to Homicidal Means: No Identified Victim: NA History of harm to others?: No Assessment of Violence: None Noted Violent  Behavior Description: NA Does patient have access to weapons?: No Criminal Charges Pending?: No Does patient have a court date: No Prior Inpatient Therapy: Prior Inpatient Therapy: Yes Prior Therapy Dates: 2011 Prior [facility]Provider([facility]  Reason for Treatment: MH issues Prior Outpatient Therapy: Prior Outpatient Therapy: No Prior Therapy Dates: NA Prior Therapy Facilty/Provider(s): NA Reason for Treatment: NA Does patient have an ACCT team?: No Does patient have Intensive In-House Services?  : No Does patient have [facility] services? : No Does patient have P4CC services?: No  Past Medical History: History reviewed. No pertinent past medical history. History reviewed. No pertinent surgical history. Family History: History reviewed. No pertinent family history. Family Psychiatric  History: Denies  Social History:  Social History   Substance and Sexual Activity  Alcohol Use No     Social History   Substance and Sexual Activity  Drug Use No    Social History   Socioeconomic History  . Marital status: Married    Spouse name: None  . Number of children: None  . Years of education: None  . Highest education level: None  Social Needs  . Financial resource strain: None  . Food insecurity - worry: None  . Food insecurity - inability: None  . Transportation needs - medical: None  . Transportation needs - non-medical: None  Occupational History  . None  Tobacco Use  . Smoking status: Never Smoker  . Smokeless tobacco: Never Used  Substance and Sexual Activity  . Alcohol use: No  . Drug use: No  . Sexual activity: None  Other Topics Concern  . None  Social History Narrative  .  None   Additional Social History: She lives at home with her husband. She is a Pharmacist, hospital in [place]. She denies alcohol, illicit substance or tobacco use.     Allergies:   Allergies  Allergen Reactions  . Doxycycline   . Iodinated Diagnostic Agents   . Septra  [Sulfamethoxazole-Trimethoprim]   . Sulfa Antibiotics     Labs:  Results for orders placed or performed during the hospital encounter of 03/09/17 (from the past 48 hour(s))  CBC     Status: None   Collection Time: 03/09/17 12:56 PM  Result Value Ref Range   WBC 5.8 4.0 - 10.5 K/uL   RBC 4.64 3.87 - 5.11 MIL/uL   Hemoglobin 12.4 12.0 - 15.0 g/dL   HCT 37.9 36.0 - 46.0 %   MCV 81.7 78.0 - 100.0 fL   MCH 26.7 26.0 - 34.0 pg   MCHC 32.7 30.0 - 36.0 g/dL   RDW 13.4 11.5 - 15.5 %   Platelets 339 150 - 400 K/uL  Comprehensive metabolic panel     Status: Abnormal   Collection Time: 03/09/17 12:56 PM  Result Value Ref Range   Sodium 135 135 - 145 mmol/L   Potassium 3.0 (L) 3.5 - 5.1 mmol/L   Chloride 103 101 - 111 mmol/L   CO2 23 22 - 32 mmol/L   Glucose, Bld 159 (H) 65 - 99 mg/dL   BUN 8 6 - 20 mg/dL   Creatinine, Ser 0.62 0.44 - 1.00 mg/dL   Calcium 9.3 8.9 - 10.3 mg/dL   Total Protein 7.8 6.5 - 8.1 g/dL   Albumin 3.9 3.5 - 5.0 g/dL   AST 26 15 - 41 U/L   ALT 21 14 - 54 U/L   Alkaline Phosphatase 46 38 - 126 U/L   Total Bilirubin 1.1 0.3 - 1.2 mg/dL   GFR calc non Af Amer >60 >60 mL/min   GFR calc Af Amer >60 >60 mL/min    Comment: (NOTE) The eGFR has been calculated using the CKD EPI equation. This calculation has not been validated in all clinical situations. eGFR's persistently <60 mL/min signify possible Chronic Kidney Disease.    Anion gap 9 5 - 15  Ethanol     Status: None   Collection Time: 03/09/17 12:56 PM  Result Value Ref Range   Alcohol, Ethyl (B) <10 <10 mg/dL    Comment:        LOWEST DETECTABLE LIMIT FOR SERUM ALCOHOL IS 10 mg/dL FOR MEDICAL PURPOSES ONLY   Rapid urine drug screen (hospital performed)     Status: None   Collection Time: 03/09/17 12:56 PM  Result Value Ref Range   Opiates NONE DETECTED NONE DETECTED   Cocaine NONE DETECTED NONE DETECTED   Benzodiazepines NONE DETECTED NONE DETECTED   Amphetamines NONE DETECTED NONE DETECTED    Tetrahydrocannabinol NONE DETECTED NONE DETECTED   Barbiturates NONE DETECTED NONE DETECTED    Comment: (NOTE) DRUG SCREEN FOR MEDICAL PURPOSES ONLY.  IF CONFIRMATION IS NEEDED FOR ANY PURPOSE, NOTIFY LAB WITHIN 5 DAYS. LOWEST DETECTABLE LIMITS FOR URINE DRUG SCREEN Drug Class                     Cutoff (ng/mL) Amphetamine and metabolites    1000 Barbiturate and metabolites    200 Benzodiazepine                 163 Tricyclics and metabolites     300 Opiates and metabolites  300 Cocaine and metabolites        300 THC                            50   I-Stat beta hCG blood, ED     Status: None   Collection Time: 03/09/17  1:21 PM  Result Value Ref Range   I-stat hCG, quantitative <5.0 <5 mIU/mL   Comment 3            Comment:   GEST. AGE      CONC.  (mIU/mL)   <=1 WEEK        5 - 50     2 WEEKS       50 - 500     3 WEEKS       100 - 10,000     4 WEEKS     1,000 - 30,000        FEMALE AND NON-PREGNANT FEMALE:     LESS THAN 5 mIU/mL     Current Facility-Administered Medications  Medication Dose Route Frequency Provider Last Rate Last Dose  . ibuprofen (ADVIL,MOTRIN) tablet 600 mg  600 mg Oral Q8H PRN Jola Schmidt, MD      . OLANZapine zydis (ZYPREXA) disintegrating tablet 5 mg  5 mg Oral Daily Ethelene Hal, NP   5 mg at 03/10/17 1046  . ondansetron (ZOFRAN) tablet 4 mg  4 mg Oral Q8H PRN Jola Schmidt, MD      . potassium chloride SA (K-DUR,KLOR-CON) CR tablet 40 mEq  40 mEq Oral Daily Jola Schmidt, MD   40 mEq at 03/10/17 1046   Current Outpatient Medications  Medication Sig Dispense Refill  . drospirenone-ethinyl estradiol (YAZ,GIANVI,LORYNA) 3-0.02 MG tablet Take 1 tablet by mouth daily.  4  . dextromethorphan-guaiFENesin (MUCINEX DM) 30-600 MG 12hr tablet Take 1 tablet by mouth 2 (two) times daily. (Patient not taking: Reported on 03/10/2017) 20 tablet 0  . fexofenadine (ALLEGRA) 60 MG tablet Take 3 tablets (180 mg total) by mouth daily. (Patient not taking:  Reported on 03/10/2017) 30 tablet 0  . fluticasone (FLONASE ALLERGY RELIEF) 50 MCG/ACT nasal spray Place 1 spray into both nostrils daily. (Patient not taking: Reported on 03/10/2017) 9.9 g 1    Musculoskeletal: Strength & Muscle Tone: within normal limits Gait & Station: normal Patient leans: N/A  Psychiatric Specialty Exam: Physical Exam  Nursing note and vitals reviewed. Constitutional: She is oriented to person, place, and time. She appears well-developed and well-nourished.  HENT:  Head: Normocephalic and atraumatic.  Neck: Normal range of motion.  Respiratory: Effort normal.  Musculoskeletal: Normal range of motion.  Neurological: She is alert and oriented to person, place, and time.  Skin: No rash noted.  Psychiatric: Thought content normal. Her affect is labile. Her speech is rapid and/or pressured. She is hyperactive. Cognition and memory are normal. She expresses impulsivity.    Review of Systems  Psychiatric/Behavioral: Negative for depression, hallucinations, substance abuse and suicidal ideas. The patient is not nervous/anxious and does not have insomnia.   All other systems reviewed and are negative.   Blood pressure 118/89, pulse 94, temperature 98.8 F (37.1 C), temperature source Oral, resp. rate 17, height 5' 3.5" (1.613 m), weight 81.6 kg (180 lb), SpO2 97 %.Body mass index is 31.39 kg/m.  General Appearance: Well Groomed, middle aged, African American woman, wearing paper hospital scrubs and lying in bed. NAD.   Eye Contact:  Good  Speech:  Pressured  Volume:  Normal  Mood:  "Good"  Affect:  Labile  Thought Process:  Disorganized  Orientation:  Full (Time, Place, and Person)  Thought Content:  Illogical and Tangential  Suicidal Thoughts:  No  Homicidal Thoughts:  No  Memory:  Immediate;   Good Recent;   Good Remote;   Good  Judgement:  Poor  Insight:  Poor  Psychomotor Activity:  Increased  Concentration:  Concentration: Fair and Attention Span: Fair   Recall:  [employer] of Knowledge:  Fair  Language:  Fair  Akathisia:  No  Handed:  Right  AIMS (if indicated):   N/A  Assets:  Financial Resources/Insurance Housing Social Support  ADL's:  Intact  Cognition:  WNL  Sleep:   Poor prior to admission but good overnight.    Assessment:  Laura Thompson is a 42 y.o. female who was admitted with manic symptoms. She presents with disorganized behavior and thoughts, racing thoughts, insomnia, pressured speech and labile affect. Her husband also reports that she has been talking to herself. She warrants inpatient psychiatric hospitalization for stabilization and treatment.   Treatment Plan Summary: Daily contact with patient to assess and evaluate symptoms and progress in treatment and Medication management  -Continue Zyprexa 5 mg BID for mania/psychosis. Patient has been on medication in the past but is unsure which medication.   Disposition: Recommend psychiatric Inpatient admission when medically cleared.  Faythe Dingwall, DO 03/10/2017 10:50 AM

## 2017-03-10 NOTE — Tx Team (Addendum)
Initial Treatment Plan 03/10/2017 5:46 PM Laura Thompson JXB:[id number]    PATIENT STRESSORS: Medication change or noncompliance   PATIENT STRENGTHS: Ability for insight Average or above average intelligence Capable of independent living General fund of knowledge Motivation for treatment/growth Supportive family/friends   PATIENT IDENTIFIED PROBLEMS: Mania Pressured speech "I may have some past trauma I'm holding on to" Insomnia                     DISCHARGE CRITERIA:  Ability to meet basic life and health needs Improved stabilization in mood, thinking, and/or behavior Verbal commitment to aftercare and medication compliance  PRELIMINARY DISCHARGE PLAN: Attend aftercare/continuing care group Return to previous living arrangement  PATIENT/FAMILY INVOLVEMENT: This treatment plan has been presented to and reviewed with the patient, Laura Thompson, and/or family member, .  The patient and family have been given the opportunity to ask questions and make suggestions.  Chaysen Tillman, [place], [place]RN 03/10/2017, 5:46 PM

## 2017-03-10 NOTE — ED Provider Notes (Signed)
ECG ordered by psychiatry. No acute changes noted.   EKG Interpretation  Date/Time:  Tuesday March 10 2017 11:49:29 EST Ventricular Rate:  63 PR Interval:  130 QRS Duration: 86 QT Interval:  402 QTC Calculation: 411 R Axis:   3 Text Interpretation:  Normal sinus rhythm no acute ST/T changes No old tracing to compare Confirmed by Pricilla LovelessGoldston, Maayan Jenning [phone] on 03/10/2017 12:00:24 PM         Pricilla LovelessGoldston, Dilia Alemany, MD 03/10/17 1226

## 2017-03-10 NOTE — ED Notes (Signed)
Bed: [facility]37 Expected date:  Expected time:  Means of arrival:  Comments: Hold for 37 

## 2017-03-10 NOTE — ED Notes (Signed)
[facility] Pt was calm, cooperative, and pleasant.

## 2017-03-10 NOTE — Progress Notes (Signed)
Adult Psychoeducational Group Note  Date:  03/10/2017 Time:  8:55 PM  Group Topic/Focus:  Wrap-Up Group:   The focus of this group is to help patients review their daily goal of treatment and discuss progress on daily workbooks.  Participation Level:  Active  Participation Quality:  Appropriate  Affect:  Appropriate  Cognitive:  Appropriate  Insight: Appropriate  Engagement in Group:  Engaged  Modes of Intervention:  Discussion  Additional Comments:  The patient expressed that she attended all groups.The patient also said that she rates today a 10.  [name], Laura Thompson 03/10/2017, 8:55 PM

## 2017-03-10 NOTE — Progress Notes (Signed)
  DATA ACTION RESPONSE  Objective- Pt. is visible in the dayroom, seen eating a snack.Presents with an animated/anxious affect and mood. Pt is tangential in thought process. Speech is loud and pressured.   Subjective- Denies having any SI/HI/AVH/Pain at this time. Is cooperative and remains safe on the unit.  1:1 interaction in private to establish rapport. Encouragement, education, & support given from staff.  PRN vistaril requested and will re-eval accordingly. Trazodone refused.   Safety maintained with Q 15 checks. Continue with POC.

## 2017-03-10 NOTE — ED Notes (Signed)
[facility] who said he will call when ready for pt.

## 2017-03-10 NOTE — Progress Notes (Signed)
Laura Thompson is a 42 year old female pt admitted on involuntary basis. On admission, she presents as hyperverbal with tangential thought process. She spoke about how she went to the doctor for plantar fascitis and spoke about getting medications and not being able to sleep recently. She denies any SI on admission and is able to contract for safety while in the hospital. She denies any substance abuse issues and reports that she does not currently have a PCP and is not on any medications. She does report that she takes vitamins. She reports that she lives with her husband and 44 year old child and reports she will go back there upon discharge. She was oriented to the unit and safety maintained.

## 2017-03-11 DIAGNOSIS — F3113 Bipolar disorder, current episode manic without psychotic features, severe: Secondary | ICD-10-CM

## 2017-03-11 DIAGNOSIS — G4700 Insomnia, unspecified: Secondary | ICD-10-CM

## 2017-03-11 MED ORDER — HYDROCERIN EX CREA
TOPICAL_CREAM | Freq: Three times a day (TID) | CUTANEOUS | Status: DC | PRN
  Administered 2017-03-11: 11:00:00 via TOPICAL
  Filled 2017-03-11: qty 113

## 2017-03-11 MED ORDER — ARIPIPRAZOLE 15 MG PO TABS
15.0000 mg | ORAL_TABLET | Freq: Every day | ORAL | Status: DC
  Administered 2017-03-11 – 2017-03-13 (×3): 15 mg via ORAL
  Filled 2017-03-11 (×5): qty 1

## 2017-03-11 MED ORDER — HYDROXYZINE HCL 50 MG PO TABS
50.0000 mg | ORAL_TABLET | Freq: Three times a day (TID) | ORAL | Status: DC | PRN
  Filled 2017-03-11: qty 10

## 2017-03-11 NOTE — [facility] Suicide Risk Assessment (Signed)
[facility]   Nursing information obtained from:    Demographic factors:    Current Mental Status:    Loss Factors:    Historical Factors:    Risk Reduction Factors:     Total Time spent with patient: 1 hour Principal Problem: Bipolar affective disorder, current episode manic (HCC) Diagnosis:   Patient Active Problem List   Diagnosis Date Noted  . Bipolar disorder, current episode manic severe with psychotic features (HCC) [F31.2] 03/10/2017  . Bipolar affective disorder, current episode manic (HCC) [F31.9] 03/10/2017   Subjective Data: see H&P for full HPI  Laura Thompson is a 42 y/o F with history of Bipolar I who was admitted on IVC placed in ED due to worsening symptoms of mania including decreased need for sleep, disorientation, responding to internal stimuli, lability, poor boundaries, and poor insight. Pt was started on zyprexa 5mg  BID and [facility]. Upon initial interview, pt agreed to trial of abilify with plan to eventually transition to long-acting injectable form if the oral form is tolerated well and has good efficacy.  Continued Clinical Symptoms:  Alcohol Use Disorder Identification Test Final Score (AUDIT): 1 The "Alcohol Use Disorders Identification Test", Guidelines for Use in Primary Care, Second Edition.  World Science writerHealth Organization [facility](WHO). Score between 0-7:  no or low risk or alcohol related problems. Score between 8-15:  moderate risk of alcohol related problems. Score between 16-19:  high risk of alcohol related problems. Score 20 or above:  warrants further diagnostic evaluation for alcohol dependence and treatment.   CLINICAL FACTORS:   Bipolar Disorder:   Mixed State   Musculoskeletal: Strength & Muscle Tone: within normal limits Gait & Station: normal Patient leans: N/A  Psychiatric Specialty Exam: Physical Exam  Nursing note and vitals reviewed.   ROS - see H&P  Blood  pressure 110/78, pulse 89, temperature 97.8 F (36.6 C), resp. rate 20, height 5\' 3"  (1.6 m), weight 74.4 kg (164 lb).Body mass index is 29.05 kg/m.  General Appearance: Casual and Fairly Groomed  Eye Contact:  Good  Speech:  Clear and Coherent and Normal Rate  Volume:  Normal  Mood:  Euphoric  Affect:  Congruent and Full Range  Thought Process:  Coherent and Goal Directed  Orientation:  Full (Time, Place, and Person)  Thought Content:  Logical  Suicidal Thoughts:  No  Homicidal Thoughts:  No  Memory:  Immediate;   Fair Recent;   Fair Remote;   Fair  Judgement:  Fair  Insight:  Fair  Psychomotor Activity:  Normal  Concentration:  Concentration: Fair  Recall:  FiservFair  Fund of Knowledge:  Fair  Language:  Fair  Akathisia:  No  Handed:    AIMS (if indicated):     Assets:  Manufacturing systems engineerCommunication Skills Physical Health Resilience Social Support  ADL's:  Intact  Cognition:  WNL  Sleep:  Number of Hours: 5        COGNITIVE FEATURES THAT CONTRIBUTE TO RISK:  None    SUICIDE RISK:   Minimal: No identifiable suicidal ideation.  Patients presenting with no risk factors but with morbid ruminations; may be classified as minimal risk based on the severity of the depressive symptoms  PLAN OF CARE:   - Admit to inpatient level of care  - Bipolar I, current episode manic without psychotic features   - Discontinue zyprexa  - Start abilify 15mg  po qDay  - Anxiety  -Continue atarax 25mg  po q6h prn anxiety  -  Insomnia  - Continue trazodone 50mg  po qhs prn insomnia  -Encourage participation in groups and the therapeutic milieu  -Discharge planning will be ongoing  I certify that inpatient services furnished can reasonably be expected to improve the patient's condition.   Micheal Likens, MD 03/11/2017, 3:01 PM

## 2017-03-11 NOTE — Progress Notes (Signed)
Nursing Progress Note: 7p-7a D: Pt currently presents with a pleasant/loud/tangential/pressured/anxious affect and behavior. Pt states "I had a great day. Everything is good in the world." Interacting appropriately with the milieu. Pt reports good sleep during the previous night with current medication regimen. Pt did attend wrap-up group.  A: Pt provided with medications per providers orders. Pt's labs and vitals were monitored throughout the night. Pt supported emotionally and encouraged to express concerns and questions. Pt educated on medications.  R: Pt's safety ensured with 15 minute and environmental checks. Pt currently denies SI, HI, and AVH. Pt verbally contracts to seek staff if SI,HI, or AVH occurs and to consult with staff before acting on any harmful thoughts. Will continue to monitor.

## 2017-03-11 NOTE — [facility] Suicide Risk Assessment (Signed)
[facility] INPATIENT:  Family/Significant Other Suicide Prevention Education  Suicide Prevention Education:  Education Completed; Laura Thompson [phone] (Husband) has been identified by the patient as the family member/significant other with whom the patient will be residing, and identified as the person(s) who will aid the patient in the event of a mental health crisis (suicidal ideations/suicide attempt).  With written consent from the patient, the family member/significant other has been provided the following suicide prevention education, prior to the and/or following the discharge of the patient.  The suicide prevention education provided includes the following:  Suicide risk factors  Suicide prevention and interventions  National Suicide Hotline telephone number  [facility] assessment telephone number  [facility] Emergency Assistance 911  [facility] and/or Residential Mobile Crisis Unit telephone number  Request made of family/significant other to:  Remove weapons (e.g., guns, rifles, knives), all items previously/currently identified as safety concern.    Remove drugs/medications (over-the-counter, prescriptions, illicit drugs), all items previously/currently identified as a safety concern.  The family member/significant other verbalizes understanding of the suicide prevention education information provided.  The family member/significant other agrees to remove the items of safety concern listed above.  Laura Thompson states that Laura Thompson has experienced similar symptoms 7-8 years ago.  Shortly after beginning new medications for a foot issue her husband states that she was not acting like herself and began to experience odd symptoms and babbling speech.  She has not taken medication for her Bipolar disorder in 7 years and they have not had a conversation about the disorder since receiving her diagnosis. 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    Laura Thompson 03/11/2017, 10:18 AM

## 2017-03-11 NOTE — [facility] Group Notes (Signed)
[facility] Group Notes:  (Nursing/MHT/Case Management/Adjunct)  Date:  03/11/2017  Time:  2:58 PM  Type of Therapy:  Group Therapy  Participation Level:  Active  Participation Quality:  Appropriate  Affect:  Appropriate  Cognitive:  Appropriate  Insight:  Appropriate  Engagement in Group:  Supportive  Modes of Intervention:  Socialization  Summary of Progress/Problems:  Active, appropriate  Earline MayotteKnight, Stefanos Haynesworth Shephard 03/11/2017, 2:58 PM

## 2017-03-11 NOTE — Progress Notes (Signed)
The patient verbalized that she had a "fantastic" day and that she was pleased that her medication was changed and that she intends to take only one medication. As for the theme of the day, her professional development will include taking her medication consistently.
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
## 2017-03-11 NOTE — Progress Notes (Signed)
Recreation Therapy Notes  Date: 03/11/17 Time: 1000 Location: 500 Hall Dayroom  Group Topic: Communication  Goal Area(s) Addresses:  Patient will effectively communicate with peers in group.  Patient will verbalize benefit of healthy communication. Patient will verbalize positive effect of healthy communication on post d/c goals.  Patient will identify communication techniques that made activity effective for group.   Behavioral Response: Engaged  Intervention: Futures traderGeometrical pictures, pencils  Activity: Back to Back Drawings.  Patients were paired up in groups of two.  One person was the speaker and the other was the listener.  The person speaking was given a geometrical picture to describe to their partner.  The listener was to draw the picture as it was described to them but they could not ask any clarifying questions.  Education: Communication, Discharge Planning  Education Outcome: Acknowledges understanding/In group clarification offered/Needs additional education.   Clinical Observations/Feedback: Pt was attentive and active during group.  Pt worked well with her peer.  Pt gave detailed instructions to partner.  Pt left early with doctor and did not return.   Caroll RancherMarjette Aishwarya Thompson, LRT/CTRS    Caroll RancherLindsay, Alfred Eckley A 03/11/2017 11:43 AM

## 2017-03-11 NOTE — [facility] Group Notes (Signed)
[facility] Group Notes:  (Nursing/MHT/Case Management/Adjunct)  Date:  03/11/2017  Time:  5:14 PM  Type of Therapy:  Group Therapy  Participation Level:  Active  Participation Quality:  Appropriate  Affect:  Appropriate  Cognitive:  Appropriate  Insight:  Appropriate  Engagement in Group:  Supportive  Modes of Intervention:  Socialization  Summary of Progress/Problems:  Active, appropriate  Earline MayotteKnight, Keiton Cosma Shephard 03/11/2017, 5:14 PM
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
## 2017-03-11 NOTE — Progress Notes (Addendum)
D:  Patient's self inventory sheet, patient sleeps good, sleep medication helpful.  Good appetite, normal energy level, good concentration.  Denied anxiety, depression and hopeless.  Denied withdrawals.  Has experienced runny nose.  Denied SI.  Denied physical problems.  Physical pain, L foot, worst pain #2 in past 24 hours.  No pain medicine.  Goal is self help.  Plans to eat, sleep, and relax on schedule.  No discharge plans. A:  Medications administered per MD orders.  Emotional support and encouragement given patient. R:  Denied SI and HI, contracts for safety.  Denied A/V hallucinations.  Safety maintained with 15 minute checks. Patient stated she has slept good the past 2 nights.  Had BM this morning.  Medications are working, has clear thoughts and better mood.  Patient wants to follow up after discharge with psychiatrist.  Wants to be in a good outpatient place after discharge.  Stated she continues to cry at times, wants discharge.

## 2017-03-11 NOTE — Progress Notes (Signed)
Recreation Therapy Notes  INPATIENT RECREATION THERAPY ASSESSMENT  Patient Details Name: Laura Thompson MRN: [id number] DOB: 03-23-1975 Today's Date: 03/11/2017  Patient Stressors: Family, Work  Pt stated she was here because she wasn't eating, taking her medications or sleeping.  Coping Skills:   Isolate, Arguments, Avoidance, Exercise, Talking, Music  Personal Challenges: Anger, Communication, Concentration, Decision-Making, Relationships, Social Interaction, Stress Management, Time Management, Trusting Others  Leisure Interests (2+):  Crafts - Other (Comment)(Scrapbooking)  Awareness of Community Resources:  Yes  Community Resources:  Recreation Center, [employer]  Current Use: Yes  Patient Strengths:  Communication; Empathetic  Patient Identified Areas of Improvement:  Taking care of self; Learning to say no  Current Recreation Participation:  Everyday  Patient Goal for Hospitalization:  "Get coping strategies, get meds regulated and set up follow up appointment"  [place]  County of Residence:  [place]  Current SI (including self-harm):  No  Current HI:  No  Consent to Intern Participation: N/A    Caroll RancherMarjette Liahm Grivas, LRT/CTRS  Caroll RancherLindsay, Pamlea Finder A 03/11/2017, 1:57 PM

## 2017-03-11 NOTE — Plan of Care (Signed)
Nurse discussed depression, anxiety, coping skills with patient.  

## 2017-03-11 NOTE — Tx Team (Signed)
Interdisciplinary Treatment and Diagnostic Plan Update  03/11/2017 Time of Session: 10:08 AM  Kilie Rund MRN: [id number]  Principal Diagnosis: Bipolar affective disorder, current episode manic ([place])  Secondary Diagnoses: Active Problems:   Bipolar affective disorder, current episode manic (HCC)   Current Medications:  Current Facility-Administered Medications  Medication Dose Route Frequency Provider Last Rate Last Dose  . acetaminophen (TYLENOL) tablet 650 mg  650 mg Oral Q6H PRN Ethelene Hal, NP   650 mg at 03/11/17 8032  . alum & mag hydroxide-simeth (MAALOX/MYLANTA) 200-200-20 MG/5ML suspension 30 mL  30 mL Oral Q4H PRN Ethelene Hal, NP      . hydrOXYzine (ATARAX/VISTARIL) tablet 25 mg  25 mg Oral TID PRN Ethelene Hal, NP   25 mg at 03/10/17 2146  . Influenza vac split quadrivalent PF (FLUARIX) injection 0.5 mL  0.5 mL Intramuscular Tomorrow-1000 [place], Christopher T, MD      . magnesium hydroxide (MILK OF MAGNESIA) suspension 30 mL  30 mL Oral Daily PRN Ethelene Hal, NP   30 mL at 03/11/17 0732  . OLANZapine zydis (ZYPREXA) disintegrating tablet 5 mg  5 mg Oral BID Ethelene Hal, NP   5 mg at 03/11/17 0729  . pneumococcal 23 valent vaccine (PNU-IMMUNE) injection 0.5 mL  0.5 mL Intramuscular Tomorrow-1000 [place], Christopher T, MD      . potassium chloride SA (K-DUR,KLOR-CON) CR tablet 40 mEq  40 mEq Oral Daily Ethelene Hal, NP   40 mEq at 03/11/17 1224  . traZODone (DESYREL) tablet 50 mg  50 mg Oral QHS Ethelene Hal, NP   50 mg at 03/10/17 2354    PTA Medications: Medications Prior to Admission  Medication Sig Dispense Refill Last Dose  . dextromethorphan-guaiFENesin (MUCINEX DM) 30-600 MG 12hr tablet Take 1 tablet by mouth 2 (two) times daily. (Patient not taking: Reported on 03/10/2017) 20 tablet 0 Not Taking at Unknown time  . drospirenone-ethinyl estradiol (YAZ,GIANVI,LORYNA) 3-0.02 MG tablet Take 1  tablet by mouth daily.  4 Past Month at Unknown time  . fexofenadine (ALLEGRA) 60 MG tablet Take 3 tablets (180 mg total) by mouth daily. (Patient not taking: Reported on 03/10/2017) 30 tablet 0 Not Taking at Unknown time  . fluticasone (FLONASE ALLERGY RELIEF) 50 MCG/ACT nasal spray Place 1 spray into both nostrils daily. (Patient not taking: Reported on 03/10/2017) 9.9 g 1 Completed Course at Unknown time    Patient Stressors: Medication change or noncompliance  Patient Strengths: Ability for insight Average or above average intelligence Capable of independent living General fund of knowledge Motivation for treatment/growth Supportive family/friends  Treatment Modalities: Medication Management, Group therapy, Case management,  1 to 1 session with clinician, Psychoeducation, Recreational therapy.   Physician Treatment Plan for Primary Diagnosis: Bipolar affective disorder, current episode manic ([place]) Long Term Goal(s): Improvement in symptoms so as ready for discharge  Short Term Goals:    Medication Management: Evaluate patient's response, side effects, and tolerance of medication regimen.  Therapeutic Interventions: 1 to 1 sessions, Unit Group sessions and Medication administration.  Evaluation of Outcomes: Progressing  Physician Treatment Plan for Secondary Diagnosis: Active Problems:   Bipolar affective disorder, current episode manic ([place])   Long Term Goal(s): Improvement in symptoms so as ready for discharge  Short Term Goals:    Medication Management: Evaluate patient's response, side effects, and tolerance of medication regimen.  Therapeutic Interventions: 1 to 1 sessions, Unit Group sessions and Medication administration.  Evaluation of Outcomes: Progressing   RN Treatment  Plan for Primary Diagnosis: Bipolar affective disorder, current episode manic ([place]) Long Term Goal(s): Knowledge of disease and therapeutic regimen to maintain health will improve  Short Term  Goals: Ability to demonstrate self-control, Ability to participate in decision making will improve, Ability to identify and develop effective coping behaviors will improve and Compliance with prescribed medications will improve  Medication Management: RN will administer medications as ordered by provider, will assess and evaluate patient's response and provide education to patient for prescribed medication. RN will report any adverse and/or side effects to prescribing provider.  Therapeutic Interventions: 1 on 1 counseling sessions, Psychoeducation, Medication administration, Evaluate responses to treatment, Monitor vital signs and CBGs as ordered, Perform/monitor CIWA, COWS, AIMS and Fall Risk screenings as ordered, Perform wound care treatments as ordered.  Evaluation of Outcomes: Progressing   LCSW Treatment Plan for Primary Diagnosis: Bipolar affective disorder, current episode manic ([place]) Long Term Goal(s): Safe transition to appropriate next level of care at discharge, Engage patient in therapeutic group addressing interpersonal concerns.  Short Term Goals: Engage patient in aftercare planning with referrals and resources, Increase emotional regulation, Facilitate acceptance of mental health diagnosis and concerns, Identify triggers associated with mental health/substance abuse issues and Increase skills for wellness and recovery  Therapeutic Interventions: Assess for all discharge needs, 1 to 1 time with Social worker, Explore available resources and support systems, Assess for adequacy in community support network, Educate family and significant other(s) on suicide prevention, Complete Psychosocial Assessment, Interpersonal group therapy.  Evaluation of Outcomes: Progressing   Progress in Treatment: Attending groups: Yes Participating in groups: Yes Taking medication as prescribed: Yes Toleration medication: Yes, no side effects reported at this time Family/Significant other contact  made: Nyellie Yetter (Husband) [phone] Patient understands diagnosis: No, limited insight  Discussing patient identified problems/goals with staff: Yes Medical problems stabilized or resolved: Yes Denies suicidal/homicidal ideation: Yes Issues/concerns per patient self-inventory: None Other: N/A  New problem(s) identified: None identified at this time.   New Short Term/Long Term Goal(s): "I have already met my goals.  I needed to sleep, and I got good sleep the last 2 nights. And I asked for something for consitpation, and then everything moved. And finally, I want a psychiatrist outside of here because the last time I saw one was in [place]."   Discharge Plan or Barriers: Upon discharge pt will return home with her husband and daughter and will follow up with [facility] in [place].    Reason for Continuation of Hospitalization: Mania Medication stabilization   Estimated Length of Stay: 03/16/17   Attendees: Patient: Megin Consalvo  03/11/2017  10:08 AM  Physician: Maris Berger, MD 03/11/2017  10:08 AM  Nursing: Sena Hitch, RN 03/11/2017  10:08 AM  RN Care Manager: Lars Pinks, RN 03/11/2017  10:08 AM  Social Worker: Ripley Fraise 03/11/2017  10:08 AM  Recreational Therapist: Winfield Cunas 03/11/2017  10:08 AM  Other: Norberto Sorenson 03/11/2017  10:08 AM  Other:  03/11/2017  10:08 AM    Scribe for Treatment Team:  Roque Lias LCSW 03/11/2017 10:08 AM

## 2017-03-11 NOTE — [facility] Group Notes (Signed)
LCSW Group Therapy Note   03/11/2017 1:15pm   Type of Therapy and Topic:  Group Therapy:  Trust and Honesty  Participation Level:  Did Not Attend  Description of Group:    In this group patients will be asked to explore the value of being honest.  Patients will be guided to discuss their thoughts, feelings, and behaviors related to honesty and trusting in others. Patients will process together how trust and honesty relate to forming relationships with peers, family members, and self. Each patient will be challenged to identify and express feelings of being vulnerable. Patients will discuss reasons why people are dishonest and identify alternative outcomes if one was truthful (to self or others). This group will be process-oriented, with patients participating in exploration of their own experiences, giving and receiving support, and processing challenge from other group members.   Therapeutic Goals: 1. Patient will identify why honesty is important to relationships and how honesty overall affects relationships.  2. Patient will identify a situation where they lied or were lied too and the  feelings, thought process, and behaviors surrounding the situation 3. Patient will identify the meaning of being vulnerable, how that feels, and how that correlates to being honest with self and others. 4. Patient will identify situations where they could have told the truth, but instead lied and explain reasons of dishonesty.   Summary of Patient Progress    Therapeutic Modalities:   Cognitive Behavioral Therapy Solution Focused Therapy Motivational Interviewing Brief Therapy  Laura RogueRodney B Jahmad Petrich, LCSW 03/11/2017 1:08 PM

## 2017-03-12 LAB — TSH: TSH: 2.442 u[IU]/mL (ref 0.350–4.500)

## 2017-03-12 LAB — LIPID PANEL
CHOL/HDL RATIO: 3.1 ratio
CHOLESTEROL: 244 mg/dL — AB (ref 0–200)
HDL: 80 mg/dL (ref >40)
LDL Cholesterol: 153 mg/dL — ABNORMAL HIGH (ref 0–99)
Triglycerides: 56 mg/dL (ref <150)
VLDL: 11 mg/dL (ref 0–40)

## 2017-03-12 LAB — HEMOGLOBIN A1C
Hgb A1c MFr Bld: 7.8 % — ABNORMAL HIGH (ref 4.8–5.6)
Mean Plasma Glucose: 177.16 mg/dL

## 2017-03-12 MED ORDER — IBUPROFEN 600 MG PO TABS
600.0000 mg | ORAL_TABLET | Freq: Four times a day (QID) | ORAL | Status: DC | PRN
  Filled 2017-03-12: qty 1

## 2017-03-12 MED ORDER — ADULT MULTIVITAMIN W/MINERALS CH
1.0000 | ORAL_TABLET | Freq: Every day | ORAL | Status: DC
  Administered 2017-03-12 – 2017-03-13 (×2): 1 via ORAL
  Filled 2017-03-12 (×4): qty 1

## 2017-03-12 MED ORDER — TRAZODONE HCL 100 MG PO TABS
100.0000 mg | ORAL_TABLET | Freq: Every day | ORAL | Status: DC
  Administered 2017-03-12: 100 mg via ORAL
  Filled 2017-03-12 (×2): qty 1

## 2017-03-12 NOTE — Progress Notes (Signed)
Goals group:  Attended and participated   Patient set goal to put myself first and engage in self care.

## 2017-03-12 NOTE — Progress Notes (Signed)
Adult Psychoeducational Group Note  Date:  03/12/2017 Time:  1620  Group Topic/Focus:  Leisure and lifestyle changes  Participation Level:  Active  Participation Quality:  Appropriate  Affect:  Appropriate  Cognitive:  Appropriate  Insight: Appropriate  Engagement in Group:  Engaged  Modes of Intervention:  Discussion and Education  Additional Comments: Activity: What would you like your life to be like one month from now?  Raeqwon Lux L 03/12/2017, 4:58 PM

## 2017-03-12 NOTE — [facility] Group Notes (Signed)
LCSW Group Therapy Note   03/12/2017 1:15pm   Type of Therapy and Topic:  Group Therapy:  Positive Affirmations   Participation Level:  Active  Description of Group: This group addressed positive affirmation toward self and others. Patients went around the room and identified two positive things about themselves and two positive things about a peer in the room. Patients reflected on how it felt to share something positive with others, to identify positive things about themselves, and to hear positive things from others. Patients were encouraged to have a daily reflection of positive characteristics or circumstances.  Therapeutic Goals 1. Patient will verbalize two of their positive qualities 2. Patient will demonstrate empathy for others by stating two positive qualities about a peer in the group 3. Patient will verbalize their feelings when voicing positive self affirmations and when voicing positive affirmations of others 4. Patients will discuss the potential positive impact on their wellness/recovery of focusing on positive traits of self and others. Summary of Patient Progress:  Laura Thompson came to group and stayed for the entire session. She shared that she is in the hospital because she does not take care of herself like she should.  She stated "This place is not for me and I don't want to be here anymore".  She appeared calm with no visible signs of psychosis.    Therapeutic Modalities Cognitive Behavioral Therapy Motivational Interviewing  Carlynn Heraldngel M Donnajean Chesnut, Student-Social Work 03/12/2017 1:29 PM

## 2017-03-12 NOTE — Progress Notes (Signed)
Recreation Therapy Notes  Date: 03/12/17 Time: 1000 Location: 500 Hall Dayroom   Group Topic: Communication, Team Building, Problem Solving  Goal Area(s) Addresses:  Patient will effectively work with peer towards shared goal.  Patient will identify skill used to make activity successful.  Patient will identify how skills used during activity can be used to reach post d/c goals.   Behavioral Response: Engaged  Intervention: STEM Activity   Activity: [employer]. Patients were provided the following materials: 5 drinking straws, 5 rubber bands, 5 paper clips, 2 index cards, 2 drinking cups, and 2 toilet paper rolls. Using the provided materials patients were asked to build a launching mechanisms to launch a ping pong ball approximately 12 feet. Patients were divided into teams of 3-5.   Education:Social Skills, Discharge Planning.   Education Outcome: Acknowledges education/In group clarification offered/Needs additional education.   Clinical Observations/Feedback: Pt was active and engaged.  Pt helped group develop a concept for their launcher.  Pt was bright and focused.  Pt stated she felt her group worked well together.    Caroll RancherMarjette Yannis Broce, LRT/CTRS      Caroll RancherLindsay, Kinte Trim A 03/12/2017 12:20 PM

## 2017-03-12 NOTE — Progress Notes (Signed)
[facility] MD Progress Note  03/12/2017 1:29 PM Laura Thompson  MRN:  [id number] Subjective:    Laura Thompson is a 42 y/o F with history of Bipolar I who was admitted on IVC placed in ED due to worsening symptoms of mania including decreased need for sleep, disorientation, responding to internal stimuli, lability, poor boundaries, and poor insight. Pt was started on zyprexa 5mg  BID and transferred to [facility] for additional evaluation and treatment. Upon initial interview, pt agreed to trial of abilify with plan to eventually transition to long-acting injectable form if the oral form is tolerated well and has good efficacy. Pt remained pleasant and cooperative on the unit, but last evening she had ongoing difficulty with insomnia.  Today upon interview, pt shares, "The sleep medicine didn't work last night, but other than that everything is normal." Pt reports that her mood is less elevated today and she feels less driven with a reduction in her excess of energy. She denies SI/HI/AH/VH. She has been participating in groups. She feels that she is tolerating her medications well without difficulty or side effects. She requests to discharge as soon as possible, and we discussed going home as early as tomorrow if pt has ongoing mood stability and some improvement of her sleep tonight. We discussed option of increasing dose of trazodone at bedtime and pt was in agreement with that plan. She had no further questions, comments, or concerns.   Principal Problem: Bipolar affective disorder, current episode manic (HCC) Diagnosis:   Patient Active Problem List   Diagnosis Date Noted  . Bipolar disorder, current episode manic severe with psychotic features (HCC) [F31.2] 03/10/2017  . Bipolar affective disorder, current episode manic (HCC) [F31.9] 03/10/2017   Total Time spent with patient: 30 minutes  Past Psychiatric History: see H&P  Past Medical History:  Past Medical History:  Diagnosis Date  . Diabetes  mellitus without complication (HCC)    History reviewed. No pertinent surgical history. Family History: History reviewed. No pertinent family history. Family Psychiatric  History: see H&P Social History:  Social History   Substance and Sexual Activity  Alcohol Use No     Social History   Substance and Sexual Activity  Drug Use No    Social History   Socioeconomic History  . Marital status: Married    Spouse name: None  . Number of children: None  . Years of education: None  . Highest education level: None  Social Needs  . Financial resource strain: None  . Food insecurity - worry: None  . Food insecurity - inability: None  . Transportation needs - medical: None  . Transportation needs - non-medical: None  Occupational History  . None  Tobacco Use  . Smoking status: Never Smoker  . Smokeless tobacco: Never Used  Substance and Sexual Activity  . Alcohol use: No  . Drug use: No  . Sexual activity: None  Other Topics Concern  . None  Social History Narrative  . None   Additional Social History:                         Sleep: Poor  Appetite:  Fair  Current Medications: Current Facility-Administered Medications  Medication Dose Route Frequency Provider Last Rate Last Dose  . alum & mag hydroxide-simeth (MAALOX/MYLANTA) 200-200-20 MG/5ML suspension 30 mL  30 mL Oral Q4H PRN Laveda Abbe, NP      . ARIPiprazole (ABILIFY) tablet 15 mg  15 mg Oral Daily Jolyne Loa  T, MD   15 mg at 03/12/17 0820  . hydrocerin (EUCERIN) cream   Topical TID PRN Micheal Likens, MD      . hydrOXYzine (ATARAX/VISTARIL) tablet 50 mg  50 mg Oral TID PRN Micheal Likens, MD      . ibuprofen (ADVIL,MOTRIN) tablet 600 mg  600 mg Oral Q6H PRN Micheal Likens, MD      . magnesium hydroxide (MILK OF MAGNESIA) suspension 30 mL  30 mL Oral Daily PRN Laveda Abbe, NP   30 mL at 03/11/17 0732  . multivitamin with minerals tablet 1  tablet  1 tablet Oral Daily Yocelyn Brocious T, MD      . potassium chloride SA (K-DUR,KLOR-CON) CR tablet 40 mEq  40 mEq Oral Daily Laveda Abbe, NP   40 mEq at 03/12/17 0820  . traZODone (DESYREL) tablet 100 mg  100 mg Oral QHS Micheal Likens, MD        Lab Results:  Results for orders placed or performed during the hospital encounter of 03/10/17 (from the past 48 hour(s))  TSH     Status: None   Collection Time: 03/12/17  6:23 AM  Result Value Ref Range   TSH 2.442 0.350 - 4.500 uIU/mL    Comment: Performed by a 3rd Generation assay with a functional sensitivity of <=0.01 uIU/mL. Performed at [facility], [address], [place], [place] [address]   Lipid panel     Status: Abnormal   Collection Time: 03/12/17  6:23 AM  Result Value Ref Range   Cholesterol 244 (H) 0 - 200 mg/dL   Triglycerides 56 <846 mg/dL   HDL 80 >96 mg/dL   Total CHOL/HDL Ratio 3.1 RATIO   VLDL 11 0 - 40 mg/dL   LDL Cholesterol 295 (H) 0 - 99 mg/dL    Comment:        Total Cholesterol/HDL:CHD Risk Coronary Heart Disease Risk Table                     Men   Women  1/2 Average Risk   3.4   3.3  Average Risk       5.0   4.4  2 X Average Risk   9.6   7.1  3 X Average Risk  23.4   11.0        Use the calculated Patient Ratio above and the CHD Risk Table to determine the patient's CHD Risk.        ATP III CLASSIFICATION (LDL):  <100     mg/dL   Optimal  284-132  mg/dL   Near or Above                    Optimal  130-159  mg/dL   Borderline  440-102  mg/dL   High  >725     mg/dL   Very High Performed at [facility], [address], [place], [place] [address]   Hemoglobin A1c     Status: Abnormal   Collection Time: 03/12/17  6:23 AM  Result Value Ref Range   Hgb A1c MFr Bld 7.8 (H) 4.8 - 5.6 %    Comment: (NOTE) Pre diabetes:          5.7%-6.4% Diabetes:              >6.4% Glycemic control for   <7.0% adults with diabetes    Mean  Plasma Glucose 177.16 mg/dL  Comment: Performed at [facility], [address] [place], [place] [address]    Blood Alcohol level:  Lab Results  Component Value Date   ETH <10 03/09/2017   [facility]  [facility]    <5        LOWEST DETECTABLE LIMIT FOR SERUM ALCOHOL IS 5 mg/dL FOR MEDICAL PURPOSES ONLY    Metabolic Disorder Labs: Lab Results  Component Value Date   HGBA1C 7.8 (H) 03/12/2017   MPG 177.16 03/12/2017   No results found for: PROLACTIN Lab Results  Component Value Date   CHOL 244 (H) 03/12/2017   TRIG 56 03/12/2017   HDL 80 03/12/2017   CHOLHDL 3.1 03/12/2017   VLDL 11 03/12/2017   LDLCALC 153 (H) 03/12/2017    Physical Findings: AIMS: Facial and Oral Movements Muscles of Facial Expression: None, normal Lips and Perioral Area: None, normal Jaw: None, normal Tongue: None, normal,Extremity Movements Upper (arms, wrists, hands, fingers): None, normal Lower (legs, knees, ankles, toes): None, normal, Trunk Movements Neck, shoulders, hips: None, normal, Overall Severity Severity of abnormal movements (highest score from questions above): None, normal Incapacitation due to abnormal movements: None, normal Patient's awareness of abnormal movements (rate only patient's report): No Awareness, Dental Status Current problems with teeth and/or dentures?: No Does patient usually wear dentures?: No  CIWA:  CIWA-Ar Total: 1 COWS:  COWS Total Score: 2  Musculoskeletal: Strength & Muscle Tone: within normal limits Gait & Station: normal Patient leans: N/A  Psychiatric Specialty Exam: Physical Exam  Nursing note and vitals reviewed.   Review of Systems  Constitutional: Negative for chills and fever.  Respiratory: Negative for cough and shortness of breath.   Cardiovascular: Negative for chest pain.  Gastrointestinal: Negative for abdominal pain, heartburn, nausea and vomiting.  Psychiatric/Behavioral: Negative for depression, hallucinations and suicidal  ideas. The patient has insomnia. The patient is not nervous/anxious.     Blood pressure 114/84, pulse (!) 109, temperature 98.9 F (37.2 C), temperature source Oral, resp. rate 18, height 5\' 3"  (1.6 m), weight 74.4 kg (164 lb).Body mass index is 29.05 kg/m.  General Appearance: Casual and Fairly Groomed  Eye Contact:  Good  Speech:  Normal Rate  Volume:  Normal  Mood:  Euthymic  Affect:  Appropriate, Congruent and Full Range  Thought Process:  Coherent and Goal Directed  Orientation:  Full (Time, Place, and Person)  Thought Content:  Logical  Suicidal Thoughts:  No  Homicidal Thoughts:  No  Memory:  Immediate;   Good Recent;   Good Remote;   Good  Judgement:  Fair  Insight:  Fair  Psychomotor Activity:  Normal  Concentration:  Concentration: Fair  Recall:  Fair  Fund of Knowledge:  Fair  Language:  Fair  Akathisia:  No  Handed:    AIMS (if indicated):     Assets:  Communication Skills Desire for Improvement Housing Physical Health Resilience Social Support Transportation  ADL's:  Intact  Cognition:  WNL  Sleep:  Number of Hours: 4.75   Treatment Plan Summary: Daily contact with patient to assess and evaluate symptoms and progress in treatment and Medication management. Pt reports improvement of her mood symptoms, but she has some ongoing difficulty with initial and middle insomnia. She agrees to increase her dose of trazodone this evening and continue her other medications without changes. She is anticipating discharge to home tomorrow if she has ongoing symptom stability.  - Admit to inpatient level of care  - Bipolar I, current episode manic without psychotic  features              - Continue abilify 15mg  po qDay  - Anxiety             -Continue atarax 25mg  po q6h prn anxiety  -Insomnia             - Change trazodone 50mg  po qhs prn insomnia to trazodone 100mg  po qhs prn insomnia  -Encourage participation in groups and the therapeutic milieu  -Discharge  planning will be ongoing  Micheal Likens, MD 03/12/2017, 1:29 PM

## 2017-03-12 NOTE — Progress Notes (Signed)
DAR NOTE: Patient presents with calm affect and pleasant mood.  Denies pain, auditory and visual hallucinations.  Described energy level as normal and concentration as good.  Rates depression at 0, hopelessness at 0, and anxiety at 0.  Maintained on routine safety checks.  Medications given as prescribed.  Support and encouragement offered as needed.  Attended group and participated.  States goal for today is "discharge."  Patient observed socializing with peers in the dayroom.  Offered no complaint.

## 2017-03-13 MED ORDER — TRAZODONE HCL 100 MG PO TABS: 100.0000 mg | ORAL_TABLET | Freq: Every day | ORAL | 0 refills | Status: DC

## 2017-03-13 MED ORDER — HYDROXYZINE HCL 50 MG PO TABS: 50.0000 mg | ORAL_TABLET | Freq: Three times a day (TID) | ORAL | 0 refills | Status: DC | PRN

## 2017-03-13 MED ORDER — HYDROCERIN EX CREA: 1.0000 "application " | TOPICAL_CREAM | Freq: Three times a day (TID) | CUTANEOUS | 0 refills | Status: DC | PRN

## 2017-03-13 MED ORDER — ARIPIPRAZOLE 15 MG PO TABS: 15.0000 mg | ORAL_TABLET | Freq: Every day | ORAL | 0 refills | Status: DC

## 2017-03-13 MED FILL — traZODone HCL 100 MG TABS: 100 | 30 days supply | Qty: 30 | Fill #0

## 2017-03-13 MED FILL — ARIPiprazole 15 MG TABS: 15 | 30 days supply | Qty: 30 | Fill #0

## 2017-03-13 MED FILL — hydrOXYzine HCL 50 MG TABS: 50 | 25 days supply | Qty: 75 | Fill #0

## 2017-03-13 NOTE — Progress Notes (Signed)
Recreation Therapy Notes  INPATIENT RECREATION TR PLAN  Patient Details Name: Laura Thompson MRN: [id number] DOB: 1975/08/20 Today's Date: 03/13/2017  Rec Therapy Plan Is patient appropriate for Therapeutic Recreation?: Yes Treatment times per week: about 3 days Estimated Length of Stay: 5-7 days TR Treatment/Interventions: Group participation (Comment)  Discharge Criteria Pt will be discharged from therapy if:: Discharged Treatment plan/goals/alternatives discussed and agreed upon by:: Patient/family  Discharge Summary Short term goals set: Pt will be able to identify and show understanding of at least 2 stress management techniques. Short term goals met: Complete Progress toward goals comments: Groups attended Which groups?: Goal setting, Communication, Other (Comment)(Team building) Reason goals not met: None Therapeutic equipment acquired: N/A Reason patient discharged from therapy: Discharge from hospital Pt/family agrees with progress & goals achieved: Yes Date patient discharged from therapy: 03/13/17    Victorino Sparrow, LRT/CTRS  Ria Comment, Tore Carreker A 03/13/2017, 11:59 AM

## 2017-03-13 NOTE — [facility] Suicide Risk Assessment (Signed)
[facility] Discharge Suicide Risk Assessment   Principal Problem: Bipolar affective disorder, current episode manic [facility]) Discharge Diagnoses:  Patient Active Problem List   Diagnosis Date Noted  . Bipolar disorder, current episode manic severe with psychotic features (HCC) [F31.2] 03/10/2017  . Bipolar affective disorder, current episode manic (HCC) [F31.9] 03/10/2017    Total Time spent with patient: 30 minutes  Musculoskeletal: Strength & Muscle Tone: within normal limits Gait & Station: normal Patient leans: N/A  Psychiatric Specialty Exam: Review of Systems  Constitutional: Negative for chills and fever.  Respiratory: Negative for cough.   Cardiovascular: Negative for chest pain and palpitations.  Gastrointestinal: Negative for abdominal pain, heartburn, nausea and vomiting.  Psychiatric/Behavioral: Negative for depression, hallucinations and suicidal ideas. The patient is not nervous/anxious.     Blood pressure 114/71, pulse (!) 145, temperature 99 F (37.2 C), temperature source Oral, resp. rate 18, height 5\' 3"  (1.6 m), weight 74.4 kg (164 lb).Body mass index is 29.05 kg/m.  General Appearance: Casual and Fairly Groomed  Patent attorney::  Good  Speech:  Clear and Coherent and Normal Rate  Volume:  Normal  Mood:  Euthymic  Affect:  Appropriate and Congruent  Thought Process:  Coherent and Goal Directed  Orientation:  Full (Time, Place, and Person)  Thought Content:  Logical  Suicidal Thoughts:  No  Homicidal Thoughts:  No  Memory:  Immediate;   Good Recent;   Good Remote;   Good  Judgement:  Fair  Insight:  Fair  Psychomotor Activity:  Normal  Concentration:  Fair  Recall:  [employer] of Knowledge:Fair  Language: Fair  Akathisia:  No  Handed:    AIMS (if indicated):     Assets:  Communication Skills Physical Health Resilience  Sleep:  Number of Hours: 4.75  Cognition: WNL  ADL's:  Intact   Mental Status Per Nursing Assessment::   On Admission:      Demographic Factors:  NA  Loss Factors: NA  Historical Factors: NA  Risk Reduction Factors:   Responsible for children under 58 years of age, Sense of responsibility to family, Employed, Living with another person, especially a relative, Positive social support and Positive coping skills or problem solving skills  Continued Clinical Symptoms:  Bipolar Disorder:   Mixed State  Cognitive Features That Contribute To Risk:  None    Suicide Risk:  Minimal: No identifiable suicidal ideation.  Patients presenting with no risk factors but with morbid ruminations; may be classified as minimal risk based on the severity of the depressive symptoms  Follow-up Information    [facility] Follow up on 04/01/2017.   Specialty:  Behavioral Health Why:  Follow up is scheduled for February 13th, 2019 at 4pm.  Please arrive at 3pm to complete paperwork.   Contact information: [address] [place] [address] [phone]        Subjective Data:  Laura Thompson is a 42 y/o F with history of Bipolar I who was admitted on IVC placed in ED due to worsening symptoms of mania including decreased need for sleep, disorientation, responding to internal stimuli, lability, poor boundaries, and poor insight. Pt was started on zyprexa 5mg  BID and transferred to [facility] for additional evaluation and treatment.Upon initial interview, pt agreed to trial of abilify with plan to eventually transition to long-acting injectable form. Pt had improvement of her presenting mood symptoms and insomnia during her stay, and she remained pleasant and cooperative on the unit.  Today  upon interview, pt shares, "I'm pretty good today - last night it was a little better for sleep for me." She reports that overall her mood is doing well. She denies SI/HI/AH/VH. Her appetite is good. Her energy level feels "normal." She is tolerating her current medication  regimen without difficulties or side effects, and she plans to continue on her current dose of abilify. Discussed with patient that she may explore option of transitioning to long-acting injectable form of abilify with her outpatient provider, and she verbalized good understanding. Pt has good motivation for outpatient follow up and to return to her family/work. She was able to engage in safety planning including plan [facility] or contact emergency services if she feels unable to maintain her own safety or the safety of others. Pt had no further questions, comments, or concerns.   Plan Of Care/Follow-up recommendations:   - Discharge to outpatient level of care  - Bipolar I, current episode manic without psychotic features - Continue abilify 15mg  po qDay  - Anxiety -Continue atarax 25mg  po q6h prn anxiety  -Insomnia - Continue trazodone 100mg  po qhs prn insomnia  Activity:  as tolerated Diet:  normal Tests:  NA Other:  see above for DC plan  Micheal Likenshristopher T Dellis Voght, MD 03/13/2017, 8:23 AM

## 2017-03-13 NOTE — Progress Notes (Signed)
Patient discharged to lobby. Patient was stable and appreciative at that time. All papers, samples and prescriptions were given and valuables returned. Verbal understanding expressed. Denies SI/HI and A/VH. Patient given opportunity to express concerns and ask questions.  

## 2017-03-13 NOTE — Progress Notes (Signed)
  [facility] Adult [facility] Discharge Plan :  Will you be returning to the same living situation after discharge:  Yes,  home At discharge, do you have transportation home?: Yes,  family Do you have the ability to pay for your medications: Yes,  insurance  Release of information consent forms completed and in the chart;  Patient's signature needed at discharge.  Patient to Follow up at: Follow-up Information    [facility] Follow up on 04/01/2017.   Specialty:  Behavioral Health Why:  Follow up is scheduled for February 13th, 2019 at 4pm.  Please arrive at 3pm to complete paperwork.   Contact information: [address] [place] [id number] [phone]          Next level of care provider has access to [place] [facility]: No  Safety Planning and Suicide: Yes  Have you used any form of tobacco in the last 30 days? (Cigarettes, Smokeless Tobacco, Cigars, and/or Pipes): No  Has patient been referred to the Quitline?: N/A patient is not a smoker  Patient has been referred for addiction treatment: N/A  Ida RogueRodney B Makenleigh Crownover, LCSW 03/13/2017, 8:37 AM

## 2017-03-13 NOTE — Progress Notes (Signed)
Nursing Progress Note: 7p-7a D: Pt currently presents with a animated/anxious/unremarkable/tangential/pressured/loud affect and behavior. Pt states "I keep waking up because my alarm keeps going off. You guys don't hear it?" Interacting appropriately with the milieu. Pt reports poor sleep during the previous night with current medication regimen. Pt did attend wrap-up group.  A: Pt provided with medications per providers orders. Pt's labs and vitals were monitored throughout the night. Pt supported emotionally and encouraged to express concerns and questions. Pt educated on medications.  R: Pt's safety ensured with 15 minute and environmental checks. Pt currently denies SI, HI, and AVH. Pt verbally contracts to seek staff if SI,HI, or AVH occurs and to consult with staff before acting on any harmful thoughts. Will continue to monitor.

## 2017-03-13 NOTE — Progress Notes (Signed)
Recreation Therapy Notes  Date: 03/13/17 Time: 1000 Location: 500 Hall Dayroom  Group Topic: Goal Setting  Goal Area(s) Addresses:  Patient will be able to identify at least 3 life goals.  Patient will be able to identify benefit of investing in life goals.  Patient will be able to identify benefit of setting life goals.   Behavioral Response:  Engaged  Intervention: Worksheet, pencils  Activity: Life Goals.  Patients were given a worksheet broken down into 6 categories (family, friends, work/school, spirituality, body and mental health).  Patients were to then identify what they are doing well, where they need to improve and set a goal to make the improvement.  Education:  Discharge Planning, Coping Skills  Education Outcome: Acknowledges Education/In Group Clarification Provided/Needs Additional Education  Clinical Observations: Pt shared her top three categories as family, work and body.  Pt stated with family she does well at allowing them to help her, needs to improve at stepping back and letting them help and set a goal of appreciating them and spending time with them.  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     Caroll RancherMarjette Vada Yellen, LRT/CTRS    Lillia AbedLindsay, Amaris Garrette A 03/13/2017 12:00 PM

## 2017-03-13 NOTE — Plan of Care (Signed)
Pt was able to show understanding of stress management techniques after talking with LRT.   Caroll RancherMarjette Helyn Schwan, LRT/CTRS

## 2017-03-13 NOTE — Discharge Summary (Signed)
Physician Discharge Summary Note  Patient:  Laura Thompson is an 42 y.o., female  MRN:  [id number]  DOB:  October 11, 1975  Patient phone:  [phone] (home)   Patient address:   [address] [place] [address],   Total Time spent with patient: Greater than 30 minutes  Date of Admission:  03/10/2017  Date of Discharge: 03-13-17  Reason for Admission: Worsening symptoms of Bipolar affective disorder (Mania & psychosis).  Principal Problem: Bipolar affective disorder, current episode manic [facility])  Discharge Diagnoses: Patient Active Problem List   Diagnosis Date Noted  . Bipolar disorder, current episode manic severe with psychotic features (HCC) [F31.2] 03/10/2017  . Bipolar affective disorder, current episode manic (HCC) [F31.9] 03/10/2017   Past Psychiatric History: Bipolar affective disorder.  Past Medical History:  Past Medical History:  Diagnosis Date  . Diabetes mellitus without complication (HCC)    History reviewed. No pertinent surgical history.  Family History: History reviewed. No pertinent family history.  Family Psychiatric  History: See H&P  Social History:  Social History   Substance and Sexual Activity  Alcohol Use No     Social History   Substance and Sexual Activity  Drug Use No    Social History   Socioeconomic History  . Marital status: Married    Spouse name: None  . Number of children: None  . Years of education: None  . Highest education level: None  Social Needs  . Financial resource strain: None  . Food insecurity - worry: None  . Food insecurity - inability: None  . Transportation needs - medical: None  . Transportation needs - non-medical: None  Occupational History  . None  Tobacco Use  . Smoking status: Never Smoker  . Smokeless tobacco: Never Used  Substance and Sexual Activity  . Alcohol use: No  . Drug use: No  . Sexual activity: None  Other Topics Concern  . None  Social History Narrative  . None    Hospital Course: Laura Thompson is a 42 y/o F with history of Bipolar I who was admitted on IVC placed in ED due to worsening symptoms of mania including decreased need for sleep, disorientation, responding to internal stimuli, lability, poor boundaries, and poor insight. Pt was started on zyprexa 5mg  BID and transferred to [facility] for additional evaluation and treatment. Upon initial evaluation, pt shares, "I really wasn't taking care of myself; I wouldn't eat food and I would only sleep a couple hours and then clean for hours - I kept thinking the house was so dirty." Pt endorses distractibility, decreased need for sleep with 6 hours at the most for sleep for the past week at least, flight of ideas, increased activities, and thoughtlessness characterized by increased spending and "maybe being too verbal with strangers." She describes her mood as elevated. She denies symptoms of depression, OCD, and PTSD. She denies all illicit substance use.  Laura Thompson was admitted to the [facility] adult unit for crisis management due to worsening symptoms of Bipolar disorder with psychotic features. She was recommended for mood stabilization treatments due to her presentation on admission.   After evaluation of her presenting symptoms, Laura Thompson was started on the medication regimen targeting those presenting symptoms. Their indications & side effects were explained to her. And with her consent for treatment, she received & was discharged on; Cogentin 0.5 mg for EPS, Hydroxyzine 25 mg prn for anxiety & Trazodone 100 mg for insomnia. She presented no other pre-existing medical problems were identified that needed treatments.  During the course of her hosptalization, Laura Thompson's improvement was monitored by observation & her daily report of symptom reduction noted. Her emotional & mental status were monitored by daily self-inventory reports completed by her & the clinical staff. She reported continued improvement on daily basis &  denied any new concerns. She was enrolled & encouraged to attend group seesions to help with recognizing triggers of her emotional crises & ways to cope better with them.         Laura Thompson was evaluated by the treatment team on daily basis for mood stability and plans for continued recovery after discharge. She was offered further treatment options upon discharge on an outpatient basis as noted below. She was encouraged to maintain satisfactory support network and home environment as this will aid in her maintaining mood stability. She was instructed & encouraged to adhere to her medication regimen as recommended by her treatment team.    Laura Thompson was seen this morning by the attending psychiatrist. Her reason for admission, treatment plans & response to treatment were discussed. She endorsed that she is doing well & ready to be discharged to continue mental health care on an outpatient basis as noted below. She was provided with all the necessary information needed to make this appointment without problems. Upon discharge, She was both mentally and medically stable denying suicidal/homicidal ideation, auditory/visual/tactile hallucinations, delusional thoughts and paranoia. She received a 7 days worth free sample of her Laura Thompson. She left Laura Thompson with all personal belongings in no distress.  Transportation per her arrangement (family).   Physical Findings: AIMS: Facial and Oral Movements Muscles of Facial Expression: None, normal Lips and Perioral Area: None, normal Jaw: None, normal Tongue: None, normal,Extremity Movements Upper (arms, wrists, hands, fingers): None, normal Lower (legs, knees, ankles, toes): None, normal, Trunk Movements Neck, shoulders, hips: None, normal, Overall Severity Severity of abnormal movements (highest score from questions above): None, normal Incapacitation due to abnormal movements: None, normal Patient's awareness of abnormal movements (rate only patient's  report): No Awareness, Dental Status Current problems with teeth and/or dentures?: No Does patient usually wear dentures?: No  CIWA:  CIWA-Ar Total: 1 COWS:  COWS Total Score: 2  Musculoskeletal: Strength & Muscle Tone: within normal limits Gait & Station: normal Patient leans: N/A  Psychiatric Specialty Exam: Physical Exam  Constitutional: She appears well-developed.  HENT:  Head: Normocephalic.  Eyes: Pupils are equal, round, and reactive to light.  Neck: Normal range of motion.  Cardiovascular:  Elevated pulse rate  Respiratory: Effort normal.  GI: Soft.  Genitourinary:  Genitourinary Comments: Deferred  Musculoskeletal: Normal range of motion.  Neurological: She is alert.  Skin: Skin is warm.    Review of Systems  Constitutional: Negative.   HENT: Negative.   Eyes: Negative.   Respiratory: Negative.   Cardiovascular: Negative.   Gastrointestinal: Negative.   Genitourinary: Negative.   Musculoskeletal: Negative.   Skin: Negative.   Neurological: Negative.   Endo/Heme/Allergies: Negative.   Psychiatric/Behavioral: Positive for depression (Stable) and hallucinations (Hx. psychosis). Negative for memory loss, substance abuse and suicidal ideas. The patient has insomnia (Stable). The patient is not nervous/anxious.     Blood pressure 114/71, pulse (!) 145, temperature 99 F (37.2 C), temperature source Oral, resp. rate 18, height 5\' 3"  (1.6 m), weight 74.4 kg (164 lb).Body mass index is 29.05 kg/m.  See Md's SRA   Have you used any form of tobacco in the last 30 days? (Cigarettes, Smokeless Tobacco, Cigars, and/or Pipes): No  Has this  patient used any form of tobacco in the last 30 days? (Cigarettes, Smokeless Tobacco, Cigars, and/or Pipes): N/A  Blood Alcohol level:  Lab Results  Component Value Date   ETH <10 03/09/2017   ETH  02/24/2009    <5        LOWEST DETECTABLE LIMIT FOR SERUM ALCOHOL IS 5 mg/dL FOR MEDICAL PURPOSES ONLY   Metabolic Disorder Labs:   Lab Results  Component Value Date   HGBA1C 7.8 (H) 03/12/2017   MPG 177.16 03/12/2017   No results found for: PROLACTIN Lab Results  Component Value Date   CHOL 244 (H) 03/12/2017   TRIG 56 03/12/2017   HDL 80 03/12/2017   CHOLHDL 3.1 03/12/2017   VLDL 11 03/12/2017   LDLCALC 153 (H) 03/12/2017   See Psychiatric Specialty Exam and Suicide Risk Assessment completed by Attending Physician prior to discharge.  Discharge destination:  Home  Is patient on multiple antipsychotic therapies at discharge:  No   Has Patient had three or more failed trials of antipsychotic monotherapy by history:  No  Recommended Plan for Multiple Antipsychotic Therapies: NA  Allergies as of 03/13/2017      Reactions   Doxycycline    Iodinated Diagnostic Agents    Septra [sulfamethoxazole-trimethoprim]    Sulfa Antibiotics       Medication List    STOP taking these Thompson   dextromethorphan-guaiFENesin 30-600 MG 12hr tablet Commonly known as:  MUCINEX DM   drospirenone-ethinyl estradiol 3-0.02 MG tablet Commonly known as:  YAZ,GIANVI,LORYNA   fexofenadine 60 MG tablet Commonly known as:  ALLEGRA   fluticasone 50 MCG/ACT nasal spray Commonly known as:  FLONASE ALLERGY RELIEF     TAKE these Thompson     Indication  ARIPiprazole 15 MG tablet Commonly known as:  ABILIFY Take 1 tablet (15 mg total) by mouth daily. For mood control Start taking on:  03/14/2017  Indication:  Mood control   hydrocerin Crea Apply 1 application topically 3 (three) times daily as needed (dry areas).  Indication:  Dry skin   hydrOXYzine 50 MG tablet Commonly known as:  ATARAX/VISTARIL Take 1 tablet (50 mg total) by mouth 3 (three) times daily as needed for anxiety.  Indication:  Feeling Anxious   traZODone 100 MG tablet Commonly known as:  DESYREL Take 1 tablet (100 mg total) by mouth at bedtime. For sleep  Indication:  Trouble Sleeping      Follow-up Information    [facility] Follow up on 04/01/2017.   Specialty:  Behavioral Health Why:  Follow up is scheduled for February 13th, 2019 at 4pm.  Please arrive at 3pm to complete paperwork.   Contact information: [address] [place] [address] [phone]         Follow-up recommendations: Activity:  As tolerated Diet: As recommended by your primary care doctor. Keep all scheduled follow-up appointments as recommended.   Comments: Patient is instructed prior to discharge to: Take all Thompson as prescribed by his/her mental healthcare provider. Report any adverse effects and or reactions from the medicines to his/her outpatient provider promptly. Patient has been instructed & cautioned: To not engage in alcohol and or illegal drug use while on prescription medicines. In the event of worsening symptoms, patient is instructed to call the crisis hotline, 911 and or go to the nearest ED for appropriate evaluation and treatment of symptoms. To follow-up with his/her primary care provider for your other medical issues, concerns and or health  care needs.   Signed: Armandina StammerAgnes Nwoko, NP, PMHNP, FNP-BC 03/13/2017, 3:42 PM    Patient seen, Suicide Assessment Completed.  Disposition Plan Reviewed   Laura Thompson is a 42 y/o F with history of Bipolar I who was admitted on IVC placed in ED due to worsening symptoms of mania including decreased need for sleep, disorientation, responding to internal stimuli, lability, poor boundaries, and poor insight. Pt was started on zyprexa 5mg  BID and [facility] and [facility].Upon initial interview, pt agreed to trial of abilify with plan to eventually transition to long-acting injectable form. Pt had improvement of her presenting mood symptoms and insomnia during her stay, and she remained pleasant and cooperative on the unit.  Today upon interview, pt shares, "I'm pretty good today - last  night it was a little better for sleep for me." She reports that overall her mood is doing well. She denies SI/HI/AH/VH. Her appetite is good. Her energy level feels "normal." She is tolerating her current medication regimen without difficulties or side effects, and she plans to continue on her current dose of abilify. Discussed with patient that she may explore option of transitioning to long-acting injectable form of abilify with her outpatient provider, and she verbalized good understanding. Pt has good motivation for outpatient follow up and to return to her family/work. She was able to engage in safety planning including plan to [facility] or contact emergency services if she feels unable to maintain her own safety or the safety of others. Pt had no further questions, comments, or concerns.  Plan Of Care/Follow-up recommendations:   - Discharge to outpatient level of care  - Bipolar I, current episode manic without psychotic features -Continueabilify 15mg  po qDay  - Anxiety -Continue atarax 25mg  po q6h prn anxiety  -Insomnia - Continue trazodone 100mg  po qhs prn insomnia  Activity:  as tolerated Diet:  normal Tests:  NA Other:  see above for DC plan  Micheal Likenshristopher T Fareeda Downard, MD

## 2017-03-13 NOTE — Progress Notes (Signed)
Pt attend wrap up group. Her day was a 10. Her goal do more for her self. She need to do self care take care of her needs put herself first.

## 2017-04-01 ENCOUNTER — Ambulatory Visit (HOSPITAL_COMMUNITY): Payer: Self-pay | Admitting: Psychiatry

## 2017-05-17 ENCOUNTER — Encounter (HOSPITAL_COMMUNITY): Payer: Self-pay | Admitting: Student

## 2017-05-17 ENCOUNTER — Emergency Department (HOSPITAL_COMMUNITY)
Admission: EM | Admit: 2017-05-17 | Discharge: 2017-05-17 | Disposition: A | Discharge status: Home / Self Care | Payer: No Typology Code available for payment source | Attending: Emergency Medicine | Admitting: Emergency Medicine

## 2017-05-17 DIAGNOSIS — Z79899 Other long term (current) drug therapy: Secondary | ICD-10-CM | POA: Insufficient documentation

## 2017-05-17 DIAGNOSIS — R440 Auditory hallucinations: Secondary | ICD-10-CM | POA: Diagnosis present

## 2017-05-17 DIAGNOSIS — Z760 Encounter for issue of repeat prescription: Secondary | ICD-10-CM | POA: Diagnosis not present

## 2017-05-17 DIAGNOSIS — E119 Type 2 diabetes mellitus without complications: Secondary | ICD-10-CM | POA: Insufficient documentation

## 2017-05-17 MED ORDER — ARIPIPRAZOLE 15 MG PO TABS: 15.0000 mg | ORAL_TABLET | Freq: Every day | ORAL | 0 refills | Status: DC

## 2017-05-17 MED ORDER — HYDROXYZINE HCL 50 MG PO TABS: 50.0000 mg | ORAL_TABLET | Freq: Three times a day (TID) | ORAL | 0 refills | Status: DC | PRN

## 2017-05-17 NOTE — Discharge Instructions (Addendum)
You were seen in the emergency department today for a refill of your medications that were prescribed at your previous behavioral health visit-these medications include Abilify and hydroxyzine.  We have given you refills of these prescriptions.  We have prescribed you new medication(s) today. Discuss the medications prescribed today with your pharmacist as they can have adverse effects and interactions with your other medicines including over the counter and prescribed medications. Seek medical evaluation if you start to experience new or abnormal symptoms after taking one of these medicines, seek care immediately if you start to experience difficulty breathing, feeling of your throat closing, facial swelling, or rash as these could be indications of a more serious allergic reaction ,   Please be sure to take these medications as prescribed.  Additionally it is important that you follow-up with behavioral health.  Upon review of your previous behavioral health after visit summary you were instructed to follow-up with:  [facility]  [address] [place] [id number] [phone]   Your appointment was scheduled for mid February, you have missed this appointment.  With this being said it is important that you call on Monday in order to schedule a follow-up appointment for sometime within the next 1 week.  If you are unable to get an appointment please follow-up with your primary care doctor.  Return to the emergency department at any time for any new or worsening symptoms or any other concerns that she may have.

## 2017-05-17 NOTE — ED Provider Notes (Signed)
[facility]-EMERGENCY DEPT Provider Note   CSN: [id number] Arrival date & time: 05/17/17  1321     History   Chief Complaint Chief Complaint  Patient presents with  . Medication Refill  . Hearing voices    HPI Laura Thompson is a 42 y.o. female with a history of diabetes mellitus and bipolar affective disorder who presents the emergency department today requesting medication refills.  [facility] 03/10/17 requiring behavioral health admission due to manic state.  She was discharged home after 3 days day with prescriptions for trazodone, Abilify, and hydroxyzine.  Patient states that she has been taking the trazodone intermittently.  She has not taking the hydroxyzine or the Abilify due to being concerned about how they would make her feel.  She states that she got rid of these medicines.  She relates that she has had some paranoia and some auditory hallucinations described as hearing voices talking about her.  She states that given her symptoms that she would like refills of these medications I would like to initiate taking them.  She has not followed up with behavioral health.  There are no specific alleviating or aggravating factors to her symptoms.  She denies suicidal thoughts, homicidal thoughts, or visual hallucinations.  She does not wish to have an additional behavioral health evaluation. HPI  Past Medical History:  Diagnosis Date  . Diabetes mellitus without complication [facility](HCC)     Patient Active Problem List   Diagnosis Date Noted  . Bipolar disorder, current episode manic severe with psychotic features (HCC) 03/10/2017  . Bipolar affective disorder, current episode manic (HCC) 03/10/2017    History reviewed. No pertinent surgical history.   OB History   None      Home Medications    Prior to Admission medications   Medication Sig Start Date End Date Taking? Authorizing Provider  ARIPiprazole (ABILIFY) 15  MG tablet Take 1 tablet (15 mg total) by mouth daily. For mood control 03/14/17   Nwoko, Nicole KindredAgnes I, NP  hydrocerin (EUCERIN) CREA Apply 1 application topically 3 (three) times daily as needed (dry areas). 03/13/17   Armandina StammerNwoko, Agnes I, NP  hydrOXYzine (ATARAX/VISTARIL) 50 MG tablet Take 1 tablet (50 mg total) by mouth 3 (three) times daily as needed for anxiety. 03/13/17   Armandina StammerNwoko, Agnes I, NP  traZODone (DESYREL) 100 MG tablet Take 1 tablet (100 mg total) by mouth at bedtime. For sleep 03/13/17   Sanjuana KavaNwoko, Agnes I, NP    Family History History reviewed. No pertinent family history.  Social History Social History   Tobacco Use  . Smoking status: Never Smoker  . Smokeless tobacco: Never Used  Substance Use Topics  . Alcohol use: No  . Drug use: No     Allergies   Doxycycline; Iodinated diagnostic agents; Septra [sulfamethoxazole-trimethoprim]; and Sulfa antibiotics   Review of Systems Review of Systems  Constitutional: Negative for chills and fever.  Eyes: Negative for visual disturbance.  Respiratory: Negative for shortness of breath.   Cardiovascular: Negative for chest pain.  Gastrointestinal: Negative for abdominal pain.  Neurological: Negative for headaches.  Psychiatric/Behavioral: Positive for hallucinations. Negative for suicidal ideas.    Physical Exam Updated Vital Signs BP 109/87 (BP Location: Left Arm)   Pulse 87   Temp 98.4 F (36.9 C) (Oral)   Resp 16   Ht 5\' 3"  (1.6 m)   Wt 84.5 kg (186 lb 4 oz)   SpO2 100%   BMI 32.99 kg/m  Physical Exam  Constitutional: She is oriented to person, place, and time. She appears well-developed and well-nourished. No distress.  HENT:  Head: Normocephalic and atraumatic.  Eyes: Conjunctivae are normal. Right eye exhibits no discharge. Left eye exhibits no discharge.  Cardiovascular: Normal rate and regular rhythm.  Pulmonary/Chest: Effort normal. No respiratory distress.  Neurological: She is alert and oriented to person, place,  and time.  Clear speech.   Psychiatric: She has a normal mood and affect. Her speech is normal and behavior is normal. She expresses no homicidal and no suicidal ideation. She expresses no suicidal plans and no homicidal plans.  Nursing note and vitals reviewed.   ED Treatments / Results  Labs (all labs ordered are listed, but only abnormal results are displayed) Labs Reviewed - No data to display  EKG None  Radiology No results found.  Procedures Procedures (including critical care time)  Medications Ordered in ED Medications - No data to display   Initial Impression / Assessment and Plan / ED Course  I have reviewed the triage vital signs and the nursing notes.  Pertinent labs & imaging results that were available during my care of the patient were reviewed by me and considered in my medical decision making (see chart for details).   Patient presents requesting medication refill of her Abilify and Hydroxyzine.  Patient is nontoxic-appearing, in no apparent distress, vitals WNL.  Patient has had some intermittent auditory hallucinations with paranoia.  She does not wish to have additional evaluation by behavioral health.  She is alert and oriented.  She has not had thoughts of suicide or homicide.  She has no other complaints at this time.  Her husband is at bedside.  Will provide refill of these 2 medications with strict instructions to take them as prescribed and follow-up with behavioral health.  Discussed strict return precautions.  Findings and plan of care discussed with supervising physician Dr. Rosalia Hammers who is in agreement.   Final Clinical Impressions(s) / ED Diagnoses   Final diagnoses:  Medication refill    ED Discharge Orders        Ordered    ARIPiprazole (ABILIFY) 15 MG tablet  Daily     05/17/17 1420    hydrOXYzine (ATARAX/VISTARIL) 50 MG tablet  3 times daily PRN     05/17/17 1420       Shalunda Lindh, [place] R, PA-C 05/17/17 1458    Margarita Grizzle,  MD 05/17/17 1616

## 2017-05-17 NOTE — ED Triage Notes (Signed)
Pt [facility] and was scared to take prescribed Abilify. She is saying the voices she's been hearing have gotten worse and she has had some acute paranoia recently. Pt is tearful and says she is ready to take the Abilify and declines another psych eval today.

## 2017-05-18 MED FILL — ARIPiprazole 15 MG TABS: 15 | 30 days supply | Qty: 30 | Fill #0

## 2017-07-09 NOTE — Progress Notes (Deleted)
Psychiatric Initial Adult Assessment   Patient Identification: Laura Thompson MRN:  [id number] Date of Evaluation:  07/09/2017 Referral Source: *** Chief Complaint:   Visit Diagnosis: No diagnosis found.  History of Present Illness:   Laura Thompson is a 42 y.o. year old female with a history of bipolar I disorder, diabetes, who is referred for   Per chart review, patient was admitted to [facility] 1/23-1/25 for manic episode, originally being brought to ED by her husband and was on IVC placed in ED. She was started on abilify. She presented to ED to continue abilify.      Associated Signs/Symptoms: Depression Symptoms:  {DEPRESSION SYMPTOMS:20000} (Hypo) Manic Symptoms:  {BHH MANIC SYMPTOMS:22872} Anxiety Symptoms:  {BHH ANXIETY SYMPTOMS:22873} Psychotic Symptoms:  {BHH PSYCHOTIC SYMPTOMS:22874} PTSD Symptoms: {BHH PTSD SYMPTOMS:22875}  Past Psychiatric History:  Outpatient:  Psychiatry admission:  Previous suicide attempt:  Past trials of medication:  History of violence:   Previous Psychotropic Medications: {YES/NO:21197}  Substance Abuse History in the last 12 months:  {yes no:314532}  Consequences of Substance Abuse: {BHH CONSEQUENCES OF SUBSTANCE ABUSE:22880}  Past Medical History:  Past Medical History:  Diagnosis Date  . Diabetes mellitus without complication (HCC)    No past surgical history on file.  Family Psychiatric History: ***  Family History: No family history on file.  Social History:   Social History   Socioeconomic History  . Marital status: Married    Spouse name: Not on file  . Number of children: Not on file  . Years of education: Not on file  . Highest education level: Not on file  Occupational History  . Not on file  Social Needs  . Financial resource strain: Not on file  . Food insecurity:    Worry: Not on file    Inability: Not on file  . Transportation needs:    Medical: Not on file    Non-medical: Not on file  Tobacco Use   . Smoking status: Never Smoker  . Smokeless tobacco: Never Used  Substance and Sexual Activity  . Alcohol use: No  . Drug use: No  . Sexual activity: Not on file  Lifestyle  . Physical activity:    Days per week: Not on file    Minutes per session: Not on file  . Stress: Not on file  Relationships  . Social connections:    Talks on phone: Not on file    Gets together: Not on file    Attends religious service: Not on file    Active member of club or organization: Not on file    Attends meetings of clubs or organizations: Not on file    Relationship status: Not on file  Other Topics Concern  . Not on file  Social History Narrative  . Not on file    Additional Social History: ***  Allergies:   Allergies  Allergen Reactions  . Doxycycline   . Iodinated Diagnostic Agents   . Septra [Sulfamethoxazole-Trimethoprim]   . Sulfa Antibiotics     Metabolic Disorder Labs: Lab Results  Component Value Date   HGBA1C 7.8 (H) 03/12/2017   MPG 177.16 03/12/2017   No results found for: PROLACTIN Lab Results  Component Value Date   CHOL 244 (H) 03/12/2017   TRIG 56 03/12/2017   HDL 80 03/12/2017   CHOLHDL 3.1 03/12/2017   VLDL 11 03/12/2017   LDLCALC 153 (H) 03/12/2017     Current Medications: Current Outpatient Medications  Medication Sig Dispense Refill  . ARIPiprazole (ABILIFY) 15  MG tablet Take 1 tablet (15 mg total) by mouth daily. For mood control 30 tablet 0  . hydrocerin (EUCERIN) CREA Apply 1 application topically 3 (three) times daily as needed (dry areas).  0  . hydrOXYzine (ATARAX/VISTARIL) 50 MG tablet Take 1 tablet (50 mg total) by mouth 3 (three) times daily as needed for anxiety. 75 tablet 0  . traZODone (DESYREL) 100 MG tablet Take 1 tablet (100 mg total) by mouth at bedtime. For sleep 30 tablet 0   No current facility-administered medications for this visit.     Neurologic: Headache: No Seizure: No Paresthesias:No  Musculoskeletal: Strength &  Muscle Tone: within normal limits Gait & Station: normal Patient leans: N/A  Psychiatric Specialty Exam: ROS  There were no vitals taken for this visit.There is no height or weight on file to calculate BMI.  General Appearance: Fairly Groomed  Eye Contact:  Good  Speech:  Clear and Coherent  Volume:  Normal  Mood:  {BHH MOOD:22306}  Affect:  {Affect (PAA):22687}  Thought Process:  Coherent  Orientation:  Full (Time, Place, and Person)  Thought Content:  Logical  Suicidal Thoughts:  {ST/HT (PAA):22692}  Homicidal Thoughts:  {ST/HT (PAA):22692}  Memory:  Immediate;   Good  Judgement:  {Judgement (PAA):22694}  Insight:  {Insight (PAA):22695}  Psychomotor Activity:  Normal  Concentration:  Concentration: Good and Attention Span: Good  Recall:  Good  Fund of Knowledge:Good  Language: Good  Akathisia:  No  Handed:  Right  AIMS (if indicated):  N/A  Assets:  Communication Skills Desire for Improvement  ADL's:  Intact  Cognition: WNL  Sleep:  ***   Assessment  Plan  The patient demonstrates the following risk factors for suicide: Chronic risk factors for suicide include: {Chronic Risk Factors for ZOXWRUE:45409811}. Acute risk factors for suicide include: {Acute Risk Factors for BJYNWGN:56213086}. Protective factors for this patient include: {Protective Factors for Suicide VHQI:69629528}. Considering these factors, the overall suicide risk at this point appears to be {Desc; low/moderate/high:110033}. Patient {ACTION; IS/IS UXL:24401027} appropriate for outpatient follow up.   Treatment Plan Summary: Plan as above   Neysa Hotter, MD 5/23/20199:41 AM

## 2017-07-14 ENCOUNTER — Ambulatory Visit (HOSPITAL_COMMUNITY): Payer: Self-pay | Admitting: Psychiatry

## 2017-10-19 ENCOUNTER — Encounter: Payer: Self-pay | Admitting: Emergency Medicine

## 2017-10-19 ENCOUNTER — Emergency Department (INDEPENDENT_AMBULATORY_CARE_PROVIDER_SITE_OTHER)
Admission: EM | Admit: 2017-10-19 | Discharge: 2017-10-19 | Disposition: A | Discharge status: Home / Self Care | Payer: BLUE CROSS/BLUE SHIELD | Source: Home / Self Care

## 2017-10-19 DIAGNOSIS — J0140 Acute pansinusitis, unspecified: Secondary | ICD-10-CM

## 2017-10-19 DIAGNOSIS — R51 Headache: Secondary | ICD-10-CM

## 2017-10-19 DIAGNOSIS — R519 Headache, unspecified: Secondary | ICD-10-CM

## 2017-10-19 MED ORDER — FLUCONAZOLE 150 MG PO TABS: 150.0000 mg | ORAL_TABLET | Freq: Once | ORAL | 0 refills | Status: AC

## 2017-10-19 MED ORDER — AMOXICILLIN-POT CLAVULANATE 875-125 MG PO TABS: 1.0000 | ORAL_TABLET | Freq: Two times a day (BID) | ORAL | 0 refills | Status: AC

## 2017-10-19 MED ORDER — IPRATROPIUM BROMIDE 0.03 % NA SOLN: 2.0000 | Freq: Three times a day (TID) | NASAL | 0 refills | Status: DC

## 2017-10-19 NOTE — ED Triage Notes (Signed)
Pt c/o HA and post nasal drip x2 days. Afebrile.

## 2017-10-19 NOTE — ED Provider Notes (Signed)
[facility]    CSN: [id number] Arrival date & time: 10/19/17  1801     History   Chief Complaint Chief Complaint  Patient presents with  . Headache    HPI Laura Thompson is a 42 y.o. female.   SINUSITIS Onset: 5 days Facial/sinus pressure with discolored nasal mucus.    Severity: moderate Tried OTC meds without significant relief. History of seasonal allergies  Symptoms:  - Fever  + URI prodrome with nasal congestion - Minimal swollen neck glands + Sinus Headache + mild ear pressure  No Allergy symptoms No significant Sore Throat No eye symptoms     +Mild non-productive cough secondary post-nasal drainage No chest pain No shortness of breath  No wheezing   No Nausea No Vomiting No diarrhea  Remainder of Review of Systems negative except as noted in the HPI.   Past Medical History:  Diagnosis Date  . Diabetes mellitus without complication [facility])     Patient Active Problem List   Diagnosis Date Noted  . Bipolar disorder, current episode manic severe with psychotic features (HCC) 03/10/2017  . Bipolar affective disorder, current episode manic (HCC) 03/10/2017    No past surgical history on file.  OB History   None      Home Medications    Prior to Admission medications   Medication Sig Start Date End Date Taking? Authorizing Provider  ARIPiprazole (ABILIFY) 15 MG tablet Take 1 tablet (15 mg total) by mouth daily. For mood control 05/17/17   Petrucelli, Samantha R, PA-C  hydrocerin (EUCERIN) CREA Apply 1 application topically 3 (three) times daily as needed (dry areas). 03/13/17   Armandina Stammer I, NP  hydrOXYzine (ATARAX/VISTARIL) 50 MG tablet Take 1 tablet (50 mg total) by mouth 3 (three) times daily as needed for anxiety. 05/17/17   Petrucelli, Samantha R, PA-C  traZODone (DESYREL) 100 MG tablet Take 1 tablet (100 mg total) by mouth at bedtime. For sleep 03/13/17   Armandina Stammer I, NP    Family History No family history on  file.  Social History Social History   Tobacco Use  . Smoking status: Never Smoker  . Smokeless tobacco: Never Used  Substance Use Topics  . Alcohol use: No  . Drug use: No     Allergies   Doxycycline; Iodinated diagnostic agents; Septra [sulfamethoxazole-trimethoprim]; and Sulfa antibiotics   Review of Systems Review of Systems Pertinent negatives listed in HPI Physical Exam Triage Vital Signs ED Triage Vitals  Enc Vitals Group     BP      Pulse      Resp      Temp      Temp src      SpO2      Weight      Height      Head Circumference      Peak Flow      Pain Score      Pain Loc      Pain Edu?      Excl. in GC?    No data found.  Updated Vital Signs There were no vitals taken for this visit.  Visual Acuity Right Eye Distance:   Left Eye Distance:   Bilateral Distance:    Right Eye Near:   Left Eye Near:    Bilateral Near:     Physical Exam  General appearance: alert, well developed, well nourished, cooperative and in no distress. Head: Normocephalic, without obvious abnormality, atraumatic,  Bilateral nares edematous and erythema. Ears TM  and canal intact. Respiratory: Respirations even and unlabored, normal respiratory rate.  Cardiovascular: RRR. Normal Heart sound. Extremities: No gross deformities Skin: Skin color, texture, turgor normal. No rashes seen  Psych: Appropriate mood and affect. Neurologic: Mental status: Alert, oriented to person, place, and time, thought content appropriate. UC Treatments / Results  Labs (all labs ordered are listed, but only abnormal results are displayed) Labs Reviewed - No data to display  EKG None  Radiology No results found.  Procedures Procedures (including critical care time)  Medications Ordered in UC Medications - No data to display  Initial Impression / Assessment and Plan / UC Course  I have reviewed the triage vital signs and the nursing notes.  Pertinent labs & imaging results that were  available during my care of the patient were reviewed by me and considered in my medical decision making (see chart for details).  Patient presents today with a complaint of 5 days of sinus pressure and post nasal drainage. She is afebrile and negative of distress. Tried and failed OTC antihistamines and nasal spray without improvement of symptoms. Will treat empirically with broad-spectrum antibiotic-Augmentin 1 tablet twice daily x 10 days, ipratropium nasal spray 2 sprays per nares, and Zyrtec or Levocetirizine for antihistamine therapy.    -The patient was given clear instructions to go to ER or return to medical center if symptoms do not improve, worsen or new problems develop. The patient verbalized understanding. Final Clinical Impressions(s) / UC Diagnoses   Final diagnoses:  Sinus headache  Acute pansinusitis, recurrence not specified     Discharge Instructions     Start Augmentin 1 tablet every 12 hours x 10 days.  Take Diflucan 150 mg once if vaginal irritation develops. Start over the counter Cetrizine 10 mg once daily at bedtime or Levocetirizine 5 mg at bedtime( whichever is most cost effective).   Ipratropium (Atrovent) 2 sprays, twice daily. For appropriate administration of the nasal spray, clear the nose, use opposite hand for opposite nare, sniff gently, exhale through your mouth.     If no improvement, follow-up here or with your PCP.    ED Prescriptions    Medication Sig Dispense Auth. Provider   amoxicillin-clavulanate (AUGMENTIN) 875-125 MG tablet Take 1 tablet by mouth 2 (two) times daily for 10 days. 20 tablet Bing Neighbors, FNP   fluconazole (DIFLUCAN) 150 MG tablet Take 1 tablet (150 mg total) by mouth once for 1 dose. Repeat if needed 2 tablet Bing Neighbors, FNP   ipratropium (ATROVENT) 0.03 % nasal spray Place 2 sprays into both nostrils 3 (three) times daily. 30 mL Bing Neighbors, FNP     Controlled Substance Prescriptions [place] Controlled  Substance Registry consulted? Not Applicable   Bing Neighbors, FNP 10/20/17 1537

## 2017-10-19 NOTE — Discharge Instructions (Addendum)
Start Augmentin 1 tablet every 12 hours x 10 days.  Take Diflucan 150 mg once if vaginal irritation develops. Start over the counter Cetrizine 10 mg once daily at bedtime or Levocetirizine 5 mg at bedtime( whichever is most cost effective).   Ipratropium (Atrovent) 2 sprays, twice daily. For appropriate administration of the nasal spray, clear the nose, use opposite hand for opposite nare, sniff gently, exhale through your mouth.     If no improvement, follow-up here or with your PCP.

## 2018-04-27 ENCOUNTER — Ambulatory Visit: Payer: Self-pay | Admitting: *Deleted

## 2018-05-10 ENCOUNTER — Ambulatory Visit: Payer: Self-pay | Admitting: Registered"

## 2018-06-01 ENCOUNTER — Encounter: Payer: Self-pay | Admitting: Registered"

## 2018-06-01 ENCOUNTER — Other Ambulatory Visit: Payer: Self-pay

## 2018-06-01 ENCOUNTER — Encounter: Payer: Self-pay | Attending: Family Medicine | Admitting: Registered"

## 2018-06-01 VITALS — Ht 64.0 in | Wt 193.7 lb

## 2018-06-01 DIAGNOSIS — E119 Type 2 diabetes mellitus without complications: Secondary | ICD-10-CM | POA: Insufficient documentation

## 2018-06-01 NOTE — Patient Instructions (Signed)
Great work on making lifestyle changes even in the midst of the pandemic Aim to eat balanced meals and snacks, nuts are a great way to balance fruit. Continue getting some exercise Aim to get adequate rest Continue taking medication as directed by your MD If you want to have immediate feedback on what effect certain foods or meals have on your blood sugar, you can check your blood sugar 2 hours after a meal. For an A1c of 7% your target blood sugar is less than 180 2 hrs after a meals, and fasting 80-130.

## 2018-06-01 NOTE — Progress Notes (Signed)
Diabetes Self-Management Education  Visit Type: First/Initial  Appt. Start Time: 1540 Appt. End Time: 1650  06/01/2018  Ms. Laura Thompson, identified by name and date of birth, is a 43 y.o. female with a diagnosis of Diabetes: Type 2.   ASSESSMENT  Height 5\' 4"  (1.626 m), weight 193 lb 11.2 oz (87.9 kg). Body mass index is 33.25 kg/m.   Patient states she was doing good until the pandemic hit, now she sleeps in and stays up until 1 or 2 am without her work schedule to help her stay in a routine. Pt states she tries to get out of bed before 11 am, eats by 1 pm.  Pt states she remembers a lot from her first education session here in 2017 and used the carb sheet to help her manage her carb intake. Pt had forgotten that fruit could raise blood sugar. We discussed the difference of natural sugar and added sugar on the new food labels - difference between effect on blood sugar and effect on health.  Pt states she was trying to eat just vegetables and meat for dinner and trying to cut carbs by eating a lot of riced cauliflower and other vegetable substitutes.   Patient states her mother has tried to encourage her to use moderation in her diet instead of cutting out foods entirely.  Pt states before COVID-19 she was going to the gym and enjoyed working out daily. Now states she does some youtube videos at home but doesn't enjoy it as much.   Patient feels she has a good handle on what to do and feels better about taking metformin.  Diabetes Self-Management Education - 06/01/18 1548      Visit Information   Visit Type  First/Initial      Initial Visit   Diabetes Type  Type 2    Are you currently following a meal plan?  No    Are you taking your medications as prescribed?  Yes   500 mg metformin   Date Diagnosed  2017      Health Coping   How would you rate your overall health?  Good      Psychosocial Assessment   Patient Belief/Attitude about Diabetes  Motivated to manage  diabetes    How often do you need to have someone help you when you read instructions, pamphlets, or other written materials from your doctor or pharmacy?  1 - Never    What is the last grade level you completed in school?  graduate school      Pre-Education Assessment   Patient understands the diabetes disease and treatment process.  Needs Review    Patient understands incorporating nutritional management into lifestyle.  Needs Review    Patient undertands incorporating physical activity into lifestyle.  Demonstrates understanding / competency    Patient understands using medications safely.  Demonstrates understanding / competency    Patient understands monitoring blood glucose, interpreting and using results  Needs Review    Patient understands prevention, detection, and treatment of acute complications.  Needs Review    Patient understands prevention, detection, and treatment of chronic complications.  Needs Review    Patient understands how to develop strategies to address psychosocial issues.  Needs Review    Patient understands how to develop strategies to promote health/change behavior.  Demonstrates understanding / competency      Complications   Last HgB A1C per patient/outside source  7.6 %    How often do you check your blood sugar?  0 times/day (not testing)    Have you had a dilated eye exam in the past 12 months?  Yes    Have you had a dental exam in the past 12 months?  Yes    Are you checking your feet?  No      Dietary Intake   Breakfast  2 packs, oatmeal, 24 oz water    Snack (morning)  apple    Lunch  chicken alfredo, sweet potato    Snack (afternoon)  apples OR ritz crackers    Dinner  grilled chicken pecan salad from [employer] OR 2 vegetables, meat    Snack (evening)  rice cakes    Beverage(s)  water      Exercise   Exercise Type  Light (walking / raking leaves)    How many days per week to you exercise?  7    How many minutes per day do you exercise?  30     Total minutes per week of exercise  210      Patient Education   Previous Diabetes Education  Yes (please comment)   at diagnosis 2017   Disease state   Definition of diabetes, type 1 and 2, and the diagnosis of diabetes;Factors that contribute to the development of diabetes    Nutrition management   Role of diet in the treatment of diabetes and the relationship between the three main macronutrients and blood glucose level;Carbohydrate counting;Food label reading, portion sizes and measuring food.    Physical activity and exercise   Role of exercise on diabetes management, blood pressure control and cardiac health.    Medications  Reviewed patients medication for diabetes, action, purpose, timing of dose and side effects.    Monitoring  Identified appropriate SMBG and/or A1C goals.    Chronic complications  Relationship between chronic complications and blood glucose control      Individualized Goals (developed by patient)   Nutrition  General guidelines for healthy choices and portions discussed    Physical Activity  Exercise 3-5 times per week    Medications  take my medication as prescribed      Post-Education Assessment   Patient understands the diabetes disease and treatment process.  Demonstrates understanding / competency    Patient understands incorporating nutritional management into lifestyle.  Demonstrates understanding / competency    Patient undertands incorporating physical activity into lifestyle.  Demonstrates understanding / competency    Patient understands using medications safely.  Demonstrates understanding / competency    Patient understands prevention, detection, and treatment of chronic complications.  Demonstrates understanding / competency    Patient understands how to develop strategies to promote health/change behavior.  Demonstrates understanding / competency      Outcomes   Expected Outcomes  Demonstrated interest in learning. Expect positive outcomes     Future DMSE  PRN    Program Status  Completed       Individualized Plan for Diabetes Self-Management Training:   Learning Objective:  Patient will have a greater understanding of diabetes self-management. Patient education plan is to attend individual and/or group sessions per assessed needs and concerns.   Patient Instructions  Randie HeinzGreat work on making lifestyle changes even in the midst of the pandemic Aim to eat balanced meals and snacks, nuts are a great way to balance fruit. Continue getting some exercise Aim to get adequate rest Continue taking medication as directed by your MD If you want to have immediate feedback on what effect certain foods or meals have  on your blood sugar, you can check your blood sugar 2 hours after a meal. For an A1c of 7% your target blood sugar is less than 180 2 hrs after a meals, and fasting 80-130.    Expected Outcomes:  Demonstrated interest in learning. Expect positive outcomes  Education material provided: ADA - How to Thrive: A Guide for Your Journey with Diabetes, My Plate, Snack sheet and Carbohydrate counting sheet  If problems or questions, patient to contact team via:  Phone  Future DSME appointment: PRN

## 2018-10-08 ENCOUNTER — Emergency Department (HOSPITAL_COMMUNITY)
Admission: EM | Admit: 2018-10-08 | Discharge: 2018-10-08 | Disposition: A | Discharge status: Other Acute Inpatient Hospital | Payer: BC Managed Care – PPO | Attending: Emergency Medicine | Admitting: Emergency Medicine

## 2018-10-08 ENCOUNTER — Other Ambulatory Visit: Payer: Self-pay

## 2018-10-08 ENCOUNTER — Emergency Department (HOSPITAL_COMMUNITY)
Admission: EM | Admit: 2018-10-08 | Discharge: 2018-10-08 | Discharge status: Against Medical Advice | Payer: BC Managed Care – PPO

## 2018-10-08 ENCOUNTER — Encounter (HOSPITAL_COMMUNITY): Payer: Self-pay | Admitting: Emergency Medicine

## 2018-10-08 DIAGNOSIS — F319 Bipolar disorder, unspecified: Secondary | ICD-10-CM | POA: Insufficient documentation

## 2018-10-08 DIAGNOSIS — E119 Type 2 diabetes mellitus without complications: Secondary | ICD-10-CM | POA: Diagnosis not present

## 2018-10-08 DIAGNOSIS — R451 Restlessness and agitation: Secondary | ICD-10-CM | POA: Diagnosis not present

## 2018-10-08 DIAGNOSIS — R45851 Suicidal ideations: Secondary | ICD-10-CM | POA: Diagnosis not present

## 2018-10-08 DIAGNOSIS — Z20828 Contact with and (suspected) exposure to other viral communicable diseases: Secondary | ICD-10-CM | POA: Insufficient documentation

## 2018-10-08 DIAGNOSIS — Z9114 Patient's other noncompliance with medication regimen: Secondary | ICD-10-CM | POA: Insufficient documentation

## 2018-10-08 DIAGNOSIS — Z046 Encounter for general psychiatric examination, requested by authority: Secondary | ICD-10-CM | POA: Insufficient documentation

## 2018-10-08 DIAGNOSIS — F309 Manic episode, unspecified: Secondary | ICD-10-CM

## 2018-10-08 LAB — CBC
HCT: 37.6 % (ref 36.0–46.0)
Hemoglobin: 12.2 g/dL (ref 12.0–15.0)
MCH: 27.3 pg (ref 26.0–34.0)
MCHC: 32.4 g/dL (ref 30.0–36.0)
MCV: 84.1 fL (ref 80.0–100.0)
Platelets: 355 10*3/uL (ref 150–400)
RBC: 4.47 MIL/uL (ref 3.87–5.11)
RDW: 13.7 % (ref 11.5–15.5)
WBC: 6.9 10*3/uL (ref 4.0–10.5)
nRBC: 0 % (ref 0.0–0.2)

## 2018-10-08 LAB — COMPREHENSIVE METABOLIC PANEL
ALT: 18 U/L (ref 0–44)
AST: 22 U/L (ref 15–41)
Albumin: 4.1 g/dL (ref 3.5–5.0)
Alkaline Phosphatase: 58 U/L (ref 38–126)
Anion gap: 9 (ref 5–15)
BUN: 9 mg/dL (ref 6–20)
CO2: 24 mmol/L (ref 22–32)
Calcium: 9 mg/dL (ref 8.9–10.3)
Chloride: 104 mmol/L (ref 98–111)
Creatinine, Ser: 0.65 mg/dL (ref 0.44–1.00)
GFR calc Af Amer: >60 mL/min (ref >60)
GFR calc non Af Amer: >60 mL/min (ref >60)
Glucose, Bld: 135 mg/dL — ABNORMAL HIGH (ref 70–99)
Potassium: 3.3 mmol/L — ABNORMAL LOW (ref 3.5–5.1)
Sodium: 137 mmol/L (ref 135–145)
Total Bilirubin: 0.5 mg/dL (ref 0.3–1.2)
Total Protein: 7.8 g/dL (ref 6.5–8.1)

## 2018-10-08 LAB — I-STAT BETA HCG BLOOD, ED (MC, WL, AP ONLY): I-stat hCG, quantitative: <5 m[IU]/mL (ref <5)

## 2018-10-08 LAB — RAPID URINE DRUG SCREEN, HOSP PERFORMED
Amphetamines: NOT DETECTED
Barbiturates: NOT DETECTED
Benzodiazepines: NOT DETECTED
Cocaine: NOT DETECTED
Opiates: NOT DETECTED
Tetrahydrocannabinol: NOT DETECTED

## 2018-10-08 LAB — SARS CORONAVIRUS 2 BY RT PCR (HOSPITAL ORDER, PERFORMED IN ~~LOC~~ HOSPITAL LAB): SARS Coronavirus 2: NEGATIVE

## 2018-10-08 LAB — ETHANOL: Alcohol, Ethyl (B): <10 mg/dL (ref <10)

## 2018-10-08 LAB — SALICYLATE LEVEL: Salicylate Lvl: <7 mg/dL (ref 2.8–30.0)

## 2018-10-08 LAB — ACETAMINOPHEN LEVEL: Acetaminophen (Tylenol), Serum: <10 ug/mL — ABNORMAL LOW (ref 10–30)

## 2018-10-08 MED ORDER — OLANZAPINE 10 MG PO TBDP
10.0000 mg | ORAL_TABLET | Freq: Once | ORAL | Status: AC
  Administered 2018-10-08: 10 mg via ORAL
  Filled 2018-10-08: qty 1

## 2018-10-08 MED ORDER — ZIPRASIDONE HCL 20 MG PO CAPS: 20.0000 mg | ORAL_CAPSULE | Freq: Once | ORAL | Status: DC

## 2018-10-08 MED ORDER — ZIPRASIDONE MESYLATE 20 MG IM SOLR
10.0000 mg | Freq: Once | INTRAMUSCULAR | Status: AC
  Administered 2018-10-08: 10 mg via INTRAMUSCULAR
  Filled 2018-10-08: qty 20

## 2018-10-08 MED ORDER — TRAZODONE HCL 100 MG PO TABS: 100.0000 mg | ORAL_TABLET | Freq: Every day | ORAL | Status: DC

## 2018-10-08 MED ORDER — HYDROXYZINE HCL 25 MG PO TABS
50.0000 mg | ORAL_TABLET | Freq: Three times a day (TID) | ORAL | Status: DC | PRN
  Administered 2018-10-08: 50 mg via ORAL
  Filled 2018-10-08: qty 2

## 2018-10-08 MED ORDER — ARIPIPRAZOLE 5 MG PO TABS
15.0000 mg | ORAL_TABLET | Freq: Every day | ORAL | Status: DC
  Filled 2018-10-08: qty 1

## 2018-10-08 MED ORDER — IPRATROPIUM BROMIDE HFA 17 MCG/ACT IN AERS
2.0000 | INHALATION_SPRAY | Freq: Three times a day (TID) | RESPIRATORY_TRACT | Status: DC
  Administered 2018-10-08: 2 via RESPIRATORY_TRACT
  Filled 2018-10-08: qty 12.9

## 2018-10-08 MED ORDER — STERILE WATER FOR INJECTION IJ SOLN
INTRAMUSCULAR | Status: AC
  Administered 2018-10-08: 04:00:00 1.2 mL via INTRAMUSCULAR
  Filled 2018-10-08: qty 10

## 2018-10-08 MED ORDER — STERILE WATER FOR INJECTION IJ SOLN
1.2000 mL | Freq: Once | INTRAMUSCULAR | Status: AC
  Administered 2018-10-08: 04:00:00 1.2 mL via INTRAMUSCULAR

## 2018-10-08 MED ORDER — IPRATROPIUM BROMIDE 0.03 % NA SOLN
2.0000 | Freq: Three times a day (TID) | NASAL | Status: DC
  Filled 2018-10-08 (×2): qty 30

## 2018-10-08 NOTE — Progress Notes (Signed)
Pt meets inpatient criteria per Peri Maris, DO. Referral information has been sent to the following hospitals for review:  [facility]     Disposition will continue to assist with inpatient placement needs.   Audree Camel, LCSW, [place] Disposition [facility]/TTS [phone]

## 2018-10-08 NOTE — BH Assessment (Signed)
Assessment Note  Laura Thompson is an 43 y.o. female that presents this date with IVC. Per IVC: "[name] has a history of Bipolar who has been having pressured and tangential speech. 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 [name] is displaying disorganized thoughts and is a danger to herself." Patient presents with anxious affect as this writer attempts to assess. Patient is somewhat disorganized and renders conflicting history. Patient states she was admitted in [facility] at [facility] for 7 days after having her first manic episode and was diagnosed with Bipolar at that time. Patient reports she did not follow through with any OP care and decided not to be on any medications. Patient states she has had "three or four episodes" since then and has just "dealt with them" until July of 2019 when she again was hospitalized with similar symptoms. Patient denies any S/I, H/I or AVH. Patient does not appear to be responding to any internal stimuli at he time of assessment. Patient denies any prior attempts or gestures at self harm. Patient denies any SA history. Collateral information was gathered from husband Laura Thompson [phone] who reports patient has been having a manic episode over the last week stating patient has not slept more that "30 minutes or so" a day in the last 4 days. Patient's husband reports patient is wandering around their home and is very disorganized. Per chart review patient was last seen on 03/09/17 when she presented with IVC at that time in a manic state. Patient has a history of medication non-compliance and husband reports patient has had been participating in high risk behaviors that he is concerned about to include leaving the stove on and having severe anxiety attacks, being aggressive towards family members and making threats to self harm although she has never acted on any of them. Per notes this date, patient is  brought in by husband for possible manic episode. Similar presentations in the past. 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 Case was staffed with Sharma CovertNorman DO who recommended a inpatient admission to assist with stabilization.   Diagnosis: F31.1 Bipolar affective disorder, current episode manic without psychotic features.   Past Medical History:  Past Medical History:  Diagnosis Date  . Diabetes mellitus without complication (HCC)     History reviewed. No pertinent surgical history.  Family History: History reviewed. No pertinent family history.  Social History:  reports that she has never smoked. She has never used smokeless tobacco. She reports that she does not drink alcohol or use drugs.  Additional Social History:  Alcohol / Drug Use Pain Medications: See MAR Prescriptions: See MAR Over the Counter: See MAR History of alcohol / drug use?: No history of alcohol / drug abuse  CIWA: CIWA-Ar BP: 122/85 Pulse Rate: 80 COWS:    Allergies:  Allergies  Allergen Reactions  . Diphenhydramine Hives  . Doxycycline Hives  . Iodinated Diagnostic Agents Hives  . Sulfa Antibiotics Hives  . Septra [Sulfamethoxazole-Trimethoprim]   . Metformin And Related Diarrhea    Home Medications: (Not in a hospital admission)   OB/GYN Status:  Patient's last menstrual period was 10/07/2018.  General Assessment Data Location of Assessment: [facility] ED TTS Assessment: In system Is this a Tele or Face-to-Face Assessment?: Face-to-Face Is this an Initial Assessment or a Re-assessment for this encounter?: Initial Assessment Patient Accompanied by:: N/A Language  Other than English: No Living Arrangements: Other (Comment) What gender do you identify as?: Female Marital status: Married GlascoMaiden name: Naval architecttringer Pregnancy Status: No Living Arrangements: Spouse/significant other Can pt return to current  living arrangement?: Yes Admission Status: Involuntary Petitioner: ED Attending Is patient capable of signing voluntary admission?: Yes Referral Source: Self/Family/Friend Insurance type: BCBS  Medical Screening Exam [facility] Walk-in ONLY) Medical Exam completed: Yes  Crisis Care Plan Living Arrangements: Spouse/significant other Legal Guardian: (NA) Name of Psychiatrist: None Name of Therapist: None  Education Status Is patient currently in school?: No Is the patient employed, unemployed or receiving disability?: Employed  Risk to self with the past 6 months Suicidal Ideation: No Has patient been a risk to self within the past 6 months prior to admission? : No Suicidal Intent: No Has patient had any suicidal intent within the past 6 months prior to admission? : No Is patient at risk for suicide?: No, but patient needs Medical Clearance Suicidal Plan?: No Has patient had any suicidal plan within the past 6 months prior to admission? : No Access to Means: No Previous Attempts/Gestures: No How many times?: 0 Other Self Harm Risks: NA Triggers for Past Attempts: (NA) Intentional Self Injurious Behavior: None Family Suicide History: No Recent stressful life event(s): Other (Comment)(COVID concerns) Persecutory voices/beliefs?: No Depression: No Depression Symptoms: (Denies) Substance abuse history and/or treatment for substance abuse?: No Suicide prevention information given to non-admitted patients: Not applicable  Risk to Others within the past 6 months Homicidal Ideation: No Does patient have any lifetime risk of violence toward others beyond the six months prior to admission? : No Thoughts of Harm to Others: No Current Homicidal Intent: No Current Homicidal Plan: No Access to Homicidal Means: No Identified Victim: NA History of harm to others?: No Assessment of Violence: None Noted Violent Behavior Description: NA Does patient have access to weapons?: No Criminal  Charges Pending?: No Does patient have a court date: No Is patient on probation?: No  Psychosis Hallucinations: None noted Delusions: None noted  Mental Status Report Appearance/Hygiene: Unremarkable Eye Contact: Fair Motor Activity: Freedom of movement Speech: Logical/coherent Level of Consciousness: Alert Mood: Anxious Affect: Appropriate to circumstance Anxiety Level: Moderate Thought Processes: Coherent, Relevant Judgement: Partial Orientation: Person, Place, Time Obsessive Compulsive Thoughts/Behaviors: None  Cognitive Functioning Concentration: Normal Memory: Recent Intact, Remote Intact Is patient IDD: No Insight: Good Impulse Control: Fair Appetite: Good Have you had any weight changes? : No Change Sleep: No Change Total Hours of Sleep: 7 Vegetative Symptoms: None  ADLScreening [facility] Assessment Services) Patient's cognitive ability adequate to safely complete daily activities?: Yes Patient able to express need for assistance with ADLs?: Yes Independently performs ADLs?: Yes (appropriate for developmental age)  Prior Inpatient Therapy Prior Inpatient Therapy: Yes Prior Therapy Dates: 2020 Prior Therapy Facilty/Provider(s): Pt cannot recall Reason for Treatment: MH issues  Prior Outpatient Therapy Prior Outpatient Therapy: No Does patient have an ACCT team?: No Does patient have Intensive In-House Services?  : No Does patient have [facility] services? : No Does patient have P4CC services?: No  ADL Screening (condition at time of admission) Patient's cognitive ability adequate to safely complete daily activities?: Yes Is the patient deaf or have difficulty hearing?: No Does the patient have difficulty seeing, even when wearing glasses/contacts?: No Does the patient have difficulty concentrating, remembering, or making decisions?: No Patient able to express need for assistance with ADLs?: Yes Does the patient have difficulty dressing or bathing?:  No Independently performs ADLs?: Yes (appropriate for developmental  age) Does the patient have difficulty walking or climbing stairs?: No Weakness of Legs: None Weakness of Arms/Hands: None  Home Assistive Devices/Equipment Home Assistive Devices/Equipment: None  Therapy Consults (therapy consults require a physician order) PT Evaluation Needed: No OT Evalulation Needed: No SLP Evaluation Needed: No Abuse/Neglect Assessment (Assessment to be complete while patient is alone) Physical Abuse: Denies Verbal Abuse: Denies Sexual Abuse: Denies Exploitation of patient/patient's resources: Denies Self-Neglect: Denies Values / Beliefs Cultural Requests During Hospitalization: None Spiritual Requests During Hospitalization: None Consults Spiritual Care Consult Needed: No Social Work Consult Needed: No Regulatory affairs officer (For Healthcare) Does Patient Have a Medical Advance Directive?: No Would patient like information on creating a medical advance directive?: No - Patient declined          Disposition: Case was staffed with Mariea Clonts DO who recommended a inpatient admission to assist with stabilization.   Disposition Initial Assessment Completed for this Encounter: Yes Disposition of Patient: Admit Type of inpatient treatment program: Adult Patient refused recommended treatment: No Mode of transportation if patient is discharged/movement?: Tomasita Crumble)  On Site Evaluation by:   Reviewed with Physician:    Mamie Nick 10/08/2018 12:24 PM

## 2018-10-08 NOTE — ED Provider Notes (Signed)
Patient will be transferred to [facility] for behavioral health admission.  Accepting physician is Zallabhaneni.   Fredia Sorrow, MD 10/08/18 [phone]

## 2018-10-08 NOTE — ED Notes (Signed)
Pt off unit to [facility] per provider. Pt alert, calm, cooperative. DC information given to sheriff for facility . Belongings given to sheriff for facility. Pt ambulatory off unit with sheriff. Pt transported by sheriff.

## 2018-10-08 NOTE — BH Assessment (Signed)
[facility] Assessment Progress Note   Case was staffed with Norman DO who recommended a inpatient admission to assist with stabilization.    

## 2018-10-08 NOTE — ED Notes (Signed)
Requested urine from patient. 

## 2018-10-08 NOTE — ED Triage Notes (Signed)
Patient has been manic for a week ago. Patient is bipolar and will not take her medication. Patient is having thoughts of suicide. Patient has not been taking her medication. Patient is not herself per husband.

## 2018-10-08 NOTE — [facility] Counselor (Addendum)
Per Sherri Rad, patient accepted to [facility] in [place], [place]. The accepting provider is Dr. Chesley Noon. Nurse report [phone]. Patient must arrive before 11pm tonight or after 6am tommorow morning. Counselor notified patient's nurse-Beth of the updated disposition.

## 2018-10-08 NOTE — ED Notes (Signed)
Report given to Beth Willis, RN.  ?

## 2018-10-08 NOTE — ED Provider Notes (Signed)
[facility]-EMERGENCY DEPT Provider Note  CSN: [id number] Arrival date & time: 10/08/18 0202  Chief Complaint(s) Manic Behavior  HPI Laura NeuRechelle Yablonski is a 43 y.o. female brought in for husband for possible manic episode.  Similar presentations in the past.  Episode began 2 days ago and has been gradually worsening.  Patient has been having pressured and tangential speech.  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  Patient was here but LWBS and was found wandering the streets by her husband and brought back.  Remainder of history, ROS, and physical exam limited due to patient's condition (psych d/o). Additional information was obtained from husband.   Level V Caveat.    HPI  Past Medical History Past Medical History:  Diagnosis Date  . Diabetes mellitus without complication [facility](HCC)    Patient Active Problem List   Diagnosis Date Noted  . Diabetes mellitus without complication (HCC) 06/01/2018  . Bipolar disorder, current episode manic severe with psychotic features (HCC) 03/10/2017  . Bipolar affective disorder, current episode manic (HCC) 03/10/2017   Home Medication(s) Prior to Admission medications   Medication Sig Start Date End Date Taking? Authorizing Provider  ARIPiprazole (ABILIFY) 15 MG tablet Take 1 tablet (15 mg total) by mouth daily. For mood control Patient not taking: Reported on 10/08/2018 05/17/17   Petrucelli, Lelon MastSamantha R, PA-C  hydrocerin (EUCERIN) CREA Apply 1 application topically 3 (three) times daily as needed (dry areas). Patient not taking: Reported on 10/08/2018 03/13/17   Armandina StammerNwoko, Agnes I, NP  hydrOXYzine (ATARAX/VISTARIL) 50 MG tablet Take 1 tablet (50 mg total) by mouth 3 (three) times daily as needed for anxiety. Patient not taking: Reported on 10/08/2018 05/17/17   Petrucelli, Samantha R, PA-C  ipratropium (ATROVENT) 0.03 % nasal spray  Place 2 sprays into both nostrils 3 (three) times daily. Patient not taking: Reported on 10/08/2018 10/19/17   Bing NeighborsHarris, Kimberly S, FNP  traZODone (DESYREL) 100 MG tablet Take 1 tablet (100 mg total) by mouth at bedtime. For sleep Patient not taking: Reported on 10/08/2018 03/13/17   Sanjuana KavaNwoko, Agnes I, NP                                                                                                                                    Past Surgical History History reviewed. No pertinent surgical history. Family History History reviewed. No pertinent family history.  Social History Social History   Tobacco Use  . Smoking status: Never Smoker  . Smokeless tobacco: Never Used  Substance Use Topics  . Alcohol use: No  . Drug use: No   Allergies Diphenhydramine, Doxycycline, Iodinated diagnostic agents, Sulfa antibiotics, Septra [sulfamethoxazole-trimethoprim], and Metformin and related  Review of Systems Review of Systems  Unable to perform ROS: Psychiatric disorder    Physical Exam Vital Signs  I have reviewed the triage vital  signs BP (!) 141/102 (BP Location: Right Arm)   Pulse 87   Temp 98.1 F (36.7 C) (Oral)   Resp 20   Ht 5\' 4"  (1.626 m)   Wt 85.7 kg   LMP 10/07/2018   SpO2 100%   BMI 32.44 kg/m   Physical Exam Vitals signs reviewed.  Constitutional:      General: She is not in acute distress.    Appearance: She is well-developed. She is not diaphoretic.  HENT:     Head: Normocephalic and atraumatic.     Right Ear: External ear normal.     Left Ear: External ear normal.     Nose: Nose normal.  Eyes:     General: No scleral icterus.       Right eye: No discharge.        Left eye: No discharge.     Conjunctiva/sclera: Conjunctivae normal.     Pupils: Pupils are equal, round, and reactive to light.  Neck:     Musculoskeletal: Normal range of motion and neck supple.     Trachea: Phonation normal.  Cardiovascular:     Rate and Rhythm: Normal rate and regular rhythm.      Heart sounds: No murmur. No friction rub. No gallop.   Pulmonary:     Effort: Pulmonary effort is normal. No respiratory distress.     Breath sounds: Normal breath sounds. No stridor. No rales.  Abdominal:     General: There is no distension.     Palpations: Abdomen is soft.     Tenderness: There is no abdominal tenderness.  Musculoskeletal: Normal range of motion.        General: No tenderness.  Skin:    General: Skin is warm and dry.     Findings: No erythema or rash.  Neurological:     Mental Status: She is alert and oriented to person, place, and time.  Psychiatric:        Attention and Perception: Attention normal.        Mood and Affect: Affect is blunt.        Speech: Speech is rapid and pressured and tangential.        Behavior: Behavior is hyperactive.     ED Results and Treatments Labs (all labs ordered are listed, but only abnormal results are displayed) Labs Reviewed  COMPREHENSIVE METABOLIC PANEL - Abnormal; Notable for the following components:      Result Value   Potassium 3.3 (*)    Glucose, Bld 135 (*)    All other components within normal limits  ACETAMINOPHEN LEVEL - Abnormal; Notable for the following components:   Acetaminophen (Tylenol), Serum <10 (*)    All other components within normal limits  SARS CORONAVIRUS 2  ETHANOL  SALICYLATE LEVEL  CBC  RAPID URINE DRUG SCREEN, HOSP PERFORMED  I-STAT BETA HCG BLOOD, ED ([facility], [facility], [facility] ONLY)  I-STAT BETA HCG BLOOD, ED ([facility], [facility], [facility] ONLY)  EKG  EKG Interpretation  Date/Time:    Ventricular Rate:    PR Interval:    QRS Duration:   QT Interval:    QTC Calculation:   R Axis:     Text Interpretation:        Radiology No results found.  Pertinent labs & imaging results that were available during my care of the patient were reviewed by me and considered in my medical decision making  (see chart for details).  Medications Ordered in ED Medications  OLANZapine zydis (ZYPREXA) disintegrating tablet 10 mg (10 mg Oral Given 10/08/18 0313)  ziprasidone (GEODON) injection 10 mg (10 mg Intramuscular Given 10/08/18 0401)  sterile water (preservative free) injection 1.2 mL (1.2 mLs Injection Given 10/08/18 0402)                                                                                                                                    Procedures .Critical Care Performed by: Nira Connardama, Roemello Speyer Eduardo, MD Authorized by: Nira Connardama, Shanena Pellegrino Eduardo, MD     CRITICAL CARE Performed by: Amadeo GarnetPedro Eduardo Trevyn Lumpkin Total critical care time: 30 minutes Critical care time was exclusive of separately billable procedures and treating other patients. Critical care was necessary to treat or prevent imminent or life-threatening deterioration. Critical care was time spent personally by me on the following activities: development of treatment plan with patient and/or surrogate as well as nursing, discussions with consultants, evaluation of patient's response to treatment, examination of patient, obtaining history from patient or surrogate, ordering and performing treatments and interventions, ordering and review of laboratory studies, ordering and review of radiographic studies, pulse oximetry and re-evaluation of patient's condition.   (including critical care time)  Medical Decision Making / ED Course I have reviewed the nursing notes for this encounter and the patient's prior records (if available in EHR or on provided paperwork).   Laura Thompson was evaluated in Emergency Department on 10/08/2018 for the symptoms described in the history of present illness. She was evaluated in the context of the global COVID-19 pandemic, which necessitated consideration that the patient might be at risk for infection with the SARS-CoV-2 virus that causes COVID-19. Institutional protocols and algorithms that  pertain to the evaluation of patients at risk for COVID-19 are in a state of rapid change based on information released by regulatory bodies including the CDC and federal and state organizations. These policies and algorithms were followed during the patient's care in the ED.  Consistent with mania. IVC'd. Medicated for agitation. Med[facility]. Anticipate admission.      Final Clinical Impression(s) / ED Diagnoses Final diagnoses:  Mania (HCC)      This chart was dictated using voice recognition software.  Despite best efforts to proofread,  errors can occur which can change the documentation meaning.   Nira Connardama, Dillinger Aston Eduardo, MD 10/08/18 [phone]

## 2018-10-08 NOTE — ED Notes (Signed)
Patient stated that she was going to leave and it did not matter if she had paperwork or not. "Watch me. I will leave if I want to."  EDP notified by the Erline Levine, RN-charge nurse.

## 2018-10-08 NOTE — ED Notes (Signed)
Pt to room 31. Pt labile. Hyperverbal. Difficult to redirect. Disorganized. Upset she not able to leave, it was discussed with pt that she was not able to leave.

## 2018-11-16 ENCOUNTER — Inpatient Hospital Stay: Payer: BC Managed Care – PPO | Admitting: Nurse Practitioner

## 2019-02-14 ENCOUNTER — Ambulatory Visit: Payer: BC Managed Care – PPO | Attending: Internal Medicine

## 2019-02-14 DIAGNOSIS — Z20822 Contact with and (suspected) exposure to covid-19: Secondary | ICD-10-CM

## 2019-02-16 LAB — NOVEL CORONAVIRUS, NAA: SARS-CoV-2, NAA: NOT DETECTED

## 2019-05-23 ENCOUNTER — Encounter: Payer: Self-pay | Admitting: Emergency Medicine

## 2019-05-23 ENCOUNTER — Emergency Department (INDEPENDENT_AMBULATORY_CARE_PROVIDER_SITE_OTHER): Payer: BC Managed Care – PPO

## 2019-05-23 ENCOUNTER — Other Ambulatory Visit: Payer: Self-pay

## 2019-05-23 ENCOUNTER — Emergency Department (INDEPENDENT_AMBULATORY_CARE_PROVIDER_SITE_OTHER)
Admission: EM | Admit: 2019-05-23 | Discharge: 2019-05-23 | Disposition: A | Discharge status: Home / Self Care | Payer: Self-pay | Source: Home / Self Care | Attending: Family Medicine | Admitting: Family Medicine

## 2019-05-23 DIAGNOSIS — S139XXA Sprain of joints and ligaments of unspecified parts of neck, initial encounter: Secondary | ICD-10-CM

## 2019-05-23 DIAGNOSIS — S1093XA Contusion of unspecified part of neck, initial encounter: Secondary | ICD-10-CM | POA: Diagnosis not present

## 2019-05-23 DIAGNOSIS — S43401A Unspecified sprain of right shoulder joint, initial encounter: Secondary | ICD-10-CM | POA: Diagnosis not present

## 2019-05-23 MED ORDER — IBUPROFEN 600 MG PO TABS
600.0000 mg | ORAL_TABLET | Freq: Once | ORAL | Status: AC
  Administered 2019-05-23: 600 mg via ORAL

## 2019-05-23 MED ORDER — CYCLOBENZAPRINE HCL 10 MG PO TABS: ORAL_TABLET | ORAL | 0 refills | Status: DC
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
## 2019-05-23 NOTE — Discharge Instructions (Addendum)
Apply ice pack for 20 to 30 minutes, 3 to 4 times daily  Continue until pain and swelling decrease.  May take Ibuprofen 200mg, 4 tabs every 8 hours with food.  Begin range of motion and stretching exercises as tolerated. °

## 2020-10-05 ENCOUNTER — Encounter (HOSPITAL_COMMUNITY): Payer: Self-pay | Admitting: *Deleted

## 2020-10-05 ENCOUNTER — Emergency Department (HOSPITAL_COMMUNITY)
Admission: EM | Admit: 2020-10-05 | Discharge: 2020-10-05 | Disposition: A | Discharge status: Home / Self Care | Payer: BC Managed Care – PPO | Attending: Emergency Medicine | Admitting: Emergency Medicine

## 2020-10-05 ENCOUNTER — Encounter (HOSPITAL_COMMUNITY): Payer: Self-pay

## 2020-10-05 ENCOUNTER — Other Ambulatory Visit: Payer: Self-pay

## 2020-10-05 ENCOUNTER — Emergency Department (HOSPITAL_COMMUNITY)
Admission: EM | Admit: 2020-10-05 | Discharge: 2020-10-05 | Disposition: A | Discharge status: Home / Self Care | Payer: BC Managed Care – PPO | Source: Home / Self Care | Attending: Emergency Medicine | Admitting: Emergency Medicine

## 2020-10-05 DIAGNOSIS — R443 Hallucinations, unspecified: Secondary | ICD-10-CM | POA: Insufficient documentation

## 2020-10-05 DIAGNOSIS — G4700 Insomnia, unspecified: Secondary | ICD-10-CM | POA: Insufficient documentation

## 2020-10-05 DIAGNOSIS — R4789 Other speech disturbances: Secondary | ICD-10-CM | POA: Insufficient documentation

## 2020-10-05 DIAGNOSIS — E119 Type 2 diabetes mellitus without complications: Secondary | ICD-10-CM | POA: Insufficient documentation

## 2020-10-05 DIAGNOSIS — F309 Manic episode, unspecified: Secondary | ICD-10-CM

## 2020-10-05 DIAGNOSIS — Z046 Encounter for general psychiatric examination, requested by authority: Secondary | ICD-10-CM | POA: Insufficient documentation

## 2020-10-05 DIAGNOSIS — Z760 Encounter for issue of repeat prescription: Secondary | ICD-10-CM | POA: Diagnosis not present

## 2020-10-05 DIAGNOSIS — G479 Sleep disorder, unspecified: Secondary | ICD-10-CM

## 2020-10-05 MED ORDER — ONDANSETRON HCL 4 MG PO TABS: 4.0000 mg | ORAL_TABLET | Freq: Three times a day (TID) | ORAL | Status: DC | PRN

## 2020-10-05 MED ORDER — LORAZEPAM 1 MG PO TABS: 1.0000 mg | ORAL_TABLET | Freq: Three times a day (TID) | ORAL | 0 refills | Status: DC | PRN

## 2020-10-05 MED ORDER — HYDROXYZINE HCL 25 MG PO TABS: 25.0000 mg | ORAL_TABLET | Freq: Every evening | ORAL | 0 refills | Status: DC | PRN

## 2020-10-05 MED ORDER — LORAZEPAM 1 MG PO TABS
1.0000 mg | ORAL_TABLET | Freq: Once | ORAL | Status: AC
  Administered 2020-10-05: 1 mg via ORAL
  Filled 2020-10-05: qty 1

## 2020-10-05 MED ORDER — ACETAMINOPHEN 325 MG PO TABS: 650.0000 mg | ORAL_TABLET | ORAL | Status: DC | PRN

## 2020-10-05 NOTE — ED Triage Notes (Signed)
Patient is manic. Patient is dancing in the room, talking to people who are not in the room. Patient was seen earlier today. Patient's husband got the prescription filled and the patient threw the medicine out of the window.

## 2020-10-05 NOTE — ED Provider Notes (Signed)
[facility]-EMERGENCY DEPT Provider Note   CSN: [id number] Arrival date & time: 10/05/20  1426     History Chief Complaint  Patient presents with   Insomnia    Laura Thompson is a 45 y.o. female.  Patient with history of bipolar disorder, diabetes --presents to the emergency department for medication to help her sleep.  She states that over the past 4 nights she has not been sleeping much.  She is unable to tell me how much she has been sleeping "because I cannot focus".  She denies any hallucinations or other medical complaints or signs of infection.  It appears that she has been on hydroxyzine in the past as well as trazodone. The onset of this condition was acute. The course is constant. Aggravating factors: none. Alleviating factors: none.        Past Medical History:  Diagnosis Date   Diabetes mellitus without complication [facility])     Patient Active Problem List   Diagnosis Date Noted   Diabetes mellitus without complication ([facility]) 06/01/2018   Bipolar disorder, current episode manic severe with psychotic features ([facility]) 03/10/2017   Bipolar affective disorder, current episode manic ([facility]) 03/10/2017    History reviewed. No pertinent surgical history.   OB History   No obstetric history on file.     No family history on file.  Social History   Tobacco Use   Smoking status: Never   Smokeless tobacco: Never  Vaping Use   Vaping Use: Never used  Substance Use Topics   Alcohol use: No   Drug use: No    Home Medications Prior to Admission medications   Medication Sig Start Date End Date Taking? Authorizing Provider  hydrOXYzine (ATARAX/VISTARIL) 25 MG tablet Take 1-2 tablets (25-50 mg total) by mouth at bedtime as needed (sleep). 10/05/20  Yes Renne Crigler, PA-C  cyclobenzaprine (FLEXERIL) 10 MG tablet Take one tab PO TID PRN muscle spasm 05/23/19   Lattie Haw, MD    Allergies    Diphenhydramine, Doxycycline, Iodinated diagnostic  agents, Sulfa antibiotics, Septra [sulfamethoxazole-trimethoprim], and Metformin and related  Review of Systems   Review of Systems  Constitutional:  Negative for fever.  Gastrointestinal:  Negative for nausea and vomiting.  Psychiatric/Behavioral:  Positive for sleep disturbance. Negative for hallucinations.    Physical Exam Updated Vital Signs BP 99/74 (BP Location: Left Arm)   Pulse 87   Temp 98.2 F (36.8 C) (Oral)   Resp 18   SpO2 100%   Physical Exam Vitals and nursing note reviewed.  Constitutional:      Appearance: She is well-developed.  HENT:     Head: Normocephalic and atraumatic.  Eyes:     Conjunctiva/sclera: Conjunctivae normal.  Pulmonary:     Effort: No respiratory distress.  Musculoskeletal:     Cervical back: Normal range of motion and neck supple.  Skin:    General: Skin is warm and dry.  Neurological:     Mental Status: She is alert.     GCS: GCS eye subscore is 4. GCS verbal subscore is 5. GCS motor subscore is 6.     Sensory: Sensation is intact. No sensory deficit.     Comments: Grossly intact neurologically.    Psychiatric:        Attention and Perception: She does not perceive auditory or visual hallucinations.     Comments: Patient is mildly argumentative, but appropriate.    ED Results / Procedures / Treatments   Labs (all labs ordered are  listed, but only abnormal results are displayed) Labs Reviewed - No data to display  EKG None  Radiology No results found.  Procedures Procedures   Medications Ordered in ED Medications - No data to display  ED Course  I have reviewed the triage vital signs and the nursing notes.  Pertinent labs & imaging results that were available during my care of the patient were reviewed by me and considered in my medical decision making (see chart for details).  Patient seen and examined.  Will give prescription for hydroxyzine to see if this helps with her sleep.  Encourage PCP follow-up for further  management.  Vital signs reviewed and are as follows: BP 99/74 (BP Location: Left Arm)   Pulse 87   Temp 98.2 F (36.8 C) (Oral)   Resp 18   SpO2 100%      MDM Rules/Calculators/A&P                           Patient here for difficulty sleeping.  No other complaints.  She does have a history of bipolar disorder, may be contributing, however patient does not appear to be decompensated psychiatrically and I do not feel that she requires emergent psychiatric evaluation at this time.    Final Clinical Impression(s) / ED Diagnoses Final diagnoses:  Difficulty sleeping    Rx / DC Orders ED Discharge Orders          Ordered    hydrOXYzine (ATARAX/VISTARIL) 25 MG tablet  At bedtime PRN        10/05/20 1449             Renne Crigler, PA-C 10/05/20 1456    Gloris Manchester, MD 10/05/20 2028

## 2020-10-05 NOTE — ED Triage Notes (Signed)
Pt states she threw away the medication that helps her sleep. She would like a prescription for something to help her sleep.

## 2020-10-05 NOTE — Discharge Instructions (Signed)
Please read and follow all provided instructions.  Your diagnoses today include:  1. Difficulty sleeping    Tests performed today include: Vital signs. See below for your results today.   Medications prescribed:  Hydroxyzine - antihistamine medication that can help with sleep  You can find this medication over-the-counter.   This medication will make you drowsy. DO NOT drive or perform any activities that require you to be awake and alert if taking this.  Take any prescribed medications only as directed.  Home care instructions:  Follow any educational materials contained in this packet.  BE VERY CAREFUL not to take multiple medicines containing Tylenol (also called acetaminophen). Doing so can lead to an overdose which can damage your liver and cause liver failure and possibly death.   Follow-up instructions: Please follow-up with your primary care provider in the next 7 days for further evaluation of your symptoms.   Return instructions:  Please return to the Emergency Department if you experience worsening symptoms.  Please return if you have any other emergent concerns.  Additional Information:  Your vital signs today were: BP 99/74 (BP Location: Left Arm)   Pulse 87   Temp 98.2 F (36.8 C) (Oral)   Resp 18   SpO2 100%  If your blood pressure (BP) was elevated above 135/85 this visit, please have this repeated by your doctor within one month. --------------

## 2020-10-05 NOTE — ED Provider Notes (Addendum)
[facility]-EMERGENCY DEPT Provider Note   CSN: [id number] Arrival date & time: 10/05/20  1634     History Chief Complaint  Patient presents with   Psychiatric Evaluation    Laura Thompson is a 45 y.o. female.  Patient seen by myself earlier for sleep disturbance and history of bipolar.  She is now accompanied by her husband.  She is talking to herself, and appears to be delirious.  She has tangential speech and is very agitated, much more so than earlier when family was not at bedside.  They report a lot of stress from daughter recently moving to college.  Patient has been tearful and depressed.  Over the past several days she has been having difficulty sleeping and has become more manic.  Per husband, she should be on medications for bipolar disorder, but does not take routinely.  They filled the hydroxyzine prescribed earlier, but patient threw it out the window of the car and therefore they came back to the hospital.  She has history of similar spells in the past, most recently several years ago.  Typically when she is able to get sleep, she "snaps out of it".  No other medical complaints.       Past Medical History:  Diagnosis Date   Diabetes mellitus without complication [facility])     Patient Active Problem List   Diagnosis Date Noted   Diabetes mellitus without complication (HCC) 06/01/2018   Bipolar disorder, current episode manic severe with psychotic features (HCC) 03/10/2017   Bipolar affective disorder, current episode manic (HCC) 03/10/2017    History reviewed. No pertinent surgical history.   OB History   No obstetric history on file.     Family History  Problem Relation Age of Onset   Diabetes Father     Social History   Tobacco Use   Smoking status: Never   Smokeless tobacco: Never  Vaping Use   Vaping Use: Never used  Substance Use Topics   Alcohol use: No   Drug use: No    Home Medications Prior to Admission medications    Medication Sig Start Date End Date Taking? Authorizing Provider  cyclobenzaprine (FLEXERIL) 10 MG tablet Take one tab PO TID PRN muscle spasm 05/23/19   Lattie Haw, MD  hydrOXYzine (ATARAX/VISTARIL) 25 MG tablet Take 1-2 tablets (25-50 mg total) by mouth at bedtime as needed (sleep). 10/05/20   Renne Crigler, PA-C    Allergies    Diphenhydramine, Doxycycline, Iodinated diagnostic agents, Sulfa antibiotics, Septra [sulfamethoxazole-trimethoprim], and Metformin and related  Review of Systems   Review of Systems  Constitutional:  Negative for fever.  HENT:  Negative for rhinorrhea and sore throat.   Eyes:  Negative for redness.  Respiratory:  Negative for cough.   Cardiovascular:  Negative for chest pain.  Gastrointestinal:  Negative for abdominal pain, diarrhea, nausea and vomiting.  Genitourinary:  Negative for dysuria, frequency, hematuria and urgency.  Musculoskeletal:  Negative for myalgias.  Skin:  Negative for rash.  Neurological:  Negative for headaches.  Psychiatric/Behavioral:  Positive for agitation, hallucinations and sleep disturbance. The patient is hyperactive.    Physical Exam Updated Vital Signs BP (!) 137/96 (BP Location: Left Arm)   Pulse 98   Temp 98.2 F (36.8 C) (Oral)   Resp 18   Ht 5\' 4"  (1.626 m)   Wt 90.2 kg   LMP 10/05/2020   SpO2 99%   BMI 34.12 kg/m   Physical Exam Vitals and nursing note reviewed.  Constitutional:      General: She is not in acute distress.    Appearance: She is well-developed.  HENT:     Head: Normocephalic and atraumatic.     Right Ear: External ear normal.     Left Ear: External ear normal.     Nose: Nose normal.  Eyes:     Conjunctiva/sclera: Conjunctivae normal.  Cardiovascular:     Rate and Rhythm: Normal rate and regular rhythm.     Heart sounds: No murmur heard. Pulmonary:     Effort: No respiratory distress.     Breath sounds: No wheezing, rhonchi or rales.  Abdominal:     Palpations: Abdomen is soft.      Tenderness: There is no abdominal tenderness. There is no guarding or rebound.  Musculoskeletal:     Cervical back: Normal range of motion and neck supple.     Right lower leg: No edema.     Left lower leg: No edema.  Skin:    General: Skin is warm and dry.     Findings: No rash.  Neurological:     General: No focal deficit present.     Mental Status: She is alert. Mental status is at baseline.     Motor: No weakness.  Psychiatric:        Attention and Perception: She is inattentive.        Mood and Affect: Affect is labile, angry and inappropriate.        Speech: Speech is tangential.        Behavior: Behavior is hyperactive.        Thought Content: Thought content does not include homicidal or suicidal ideation.    ED Results / Procedures / Treatments   Labs (all labs ordered are listed, but only abnormal results are displayed) Labs Reviewed  RESP PANEL BY RT-PCR (FLU A&B, COVID) ARPGX2  COMPREHENSIVE METABOLIC PANEL  ETHANOL  RAPID URINE DRUG SCREEN, HOSP PERFORMED  CBC WITH DIFFERENTIAL/PLATELET  I-STAT BETA HCG BLOOD, ED ([facility], [facility], [facility] ONLY)    EKG None  Radiology No results found.  Procedures Procedures   Medications Ordered in ED Medications  acetaminophen (TYLENOL) tablet 650 mg (has no administration in time range)  ondansetron (ZOFRAN) tablet 4 mg (has no administration in time range)  LORazepam (ATIVAN) tablet 1 mg (has no administration in time range)    ED Course  I have reviewed the triage vital signs and the nursing notes.  Pertinent labs & imaging results that were available during my care of the patient were reviewed by me and considered in my medical decision making (see chart for details).  Patient seen and examined.  Seen by myself earlier, now floridly manic with hallucinations, endorses disorganized speech patterns.  Here with family who provides additional history.  Recommend psych evaluation and stabilization.  Labs ordered.  Vital signs  reviewed and are as follows: BP (!) 137/96 (BP Location: Left Arm)   Pulse 98   Temp 98.2 F (36.8 C) (Oral)   Resp 18   Ht 5\' 4"  (1.626 m)   Wt 90.2 kg   LMP 10/05/2020   SpO2 99%   BMI 34.12 kg/m   6:55 PM patient refused labs earlier.  In the interim, she has responded well to the oral Ativan and is much more calm now.  Patient is requesting discharge.  I had a long discussion with the patient's husband.  He has been through this scenario in the past.  Currently he is  requesting and is comfortable with discharge from the emergency department.  He feels that she just needs to get some rest and will do better.  I spoke with the patient briefly and she is appropriate and much more calm now.  She is slightly drowsy from the Ativan.  Current plan is to treat her insomnia with #5 lorazepam 1 mg tablets every 8 hours as needed and hydroxyzine.  We discussed plan in case she continues to worsen and decompensates.  This includes return to the emergency department or a visit to the behavioral health urgent care.  I have provided the patient and husband with referral information.  Discussed in severe cases of mania and uncontrolled behavior, involuntary commitment for medical stabilization is appropriate.  At this time, patient's husband would like to avoid this and he seems reliable to monitor her closely.  He is off over the weekend and can take any steps needed to keep her safe.  We did discuss safety profile for use of lorazepam.  We discussed risk of respiratory depression if used or combine with alcohol.  He verbalizes understanding.    MDM Rules/Calculators/A&P                           Mania.    Final Clinical Impression(s) / ED Diagnoses Final diagnoses:  Mania [facility])    Rx / DC Orders ED Discharge Orders     None        Renne Crigler, PA-C 10/05/20 1720    Renne Crigler, PA-C 10/05/20 1858    Terald Sleeper, MD 10/05/20 1919

## 2020-10-05 NOTE — ED Notes (Signed)
Triage 3 jumping around in room and yelling at her family member. Unable to direct pt to sit down for labs. Pt refuses to let female tech help her or to cooperate with labs for this Clinical research associate.

## 2020-10-05 NOTE — Discharge Instructions (Signed)
Please read and follow all provided instructions.  Your diagnoses today include:  1. Mania (HCC)     Tests performed today include: TESTS Vital signs. See below for your results today.   Medications prescribed:  Ativan - medication for agitation, anxiety, and sleep  DO NOT drive or perform any activities that require you to be awake and alert because this medicine can make you drowsy.  Take only as prescribed and do not combine with alcohol.  This medicine can depress her breathing and potentially make you stop breathing if you take too much.  Hydroxyzine - antihistamine  You can find this medication over-the-counter.   This medication will make you drowsy. DO NOT drive or perform any activities that require you to be awake and alert if taking this.  Take any prescribed medications only as directed.  Home care instructions:  Follow any educational materials contained in this packet.  BE VERY CAREFUL not to take multiple medicines containing Tylenol (also called acetaminophen). Doing so can lead to an overdose which can damage your liver and cause liver failure and possibly death.   Follow-up instructions: You need to follow-up with your psychiatrist or primary care doctor in the next 1 week.  If symptoms get worse, please go to the behavioral health urgent care or return to [facility] emergency department.  Return instructions:  Please return to the Emergency Department if you experience worsening symptoms.  Please return if you have any other emergent concerns.  Additional Information:  Your vital signs today were: BP (!) 137/96 (BP Location: Left Arm)   Pulse 98   Temp 98.2 F (36.8 C) (Oral)   Resp 18   Ht 5\' 4"  (1.626 m)   Wt 90.2 kg   LMP 10/05/2020   SpO2 99%   BMI 34.12 kg/m  If your blood pressure (BP) was elevated above 135/85 this visit, please have this repeated by your doctor within one month. --------------

## 2020-10-06 ENCOUNTER — Encounter (HOSPITAL_COMMUNITY): Payer: Self-pay | Admitting: Emergency Medicine

## 2020-10-06 ENCOUNTER — Emergency Department (HOSPITAL_COMMUNITY)
Admission: EM | Admit: 2020-10-06 | Discharge: 2020-10-10 | Disposition: A | Discharge status: Home / Self Care | Payer: BC Managed Care – PPO | Attending: Emergency Medicine | Admitting: Emergency Medicine

## 2020-10-06 DIAGNOSIS — R440 Auditory hallucinations: Secondary | ICD-10-CM | POA: Insufficient documentation

## 2020-10-06 DIAGNOSIS — F3110 Bipolar disorder, current episode manic without psychotic features, unspecified: Secondary | ICD-10-CM | POA: Diagnosis present

## 2020-10-06 DIAGNOSIS — E119 Type 2 diabetes mellitus without complications: Secondary | ICD-10-CM | POA: Insufficient documentation

## 2020-10-06 DIAGNOSIS — U071 COVID-19: Secondary | ICD-10-CM | POA: Insufficient documentation

## 2020-10-06 DIAGNOSIS — F3112 Bipolar disorder, current episode manic without psychotic features, moderate: Secondary | ICD-10-CM | POA: Diagnosis not present

## 2020-10-06 DIAGNOSIS — F3010 Manic episode without psychotic symptoms, unspecified: Secondary | ICD-10-CM

## 2020-10-06 DIAGNOSIS — R000 Tachycardia, unspecified: Secondary | ICD-10-CM | POA: Diagnosis not present

## 2020-10-06 DIAGNOSIS — G4700 Insomnia, unspecified: Secondary | ICD-10-CM | POA: Diagnosis present

## 2020-10-06 DIAGNOSIS — R441 Visual hallucinations: Secondary | ICD-10-CM | POA: Insufficient documentation

## 2020-10-06 LAB — CBC WITH DIFFERENTIAL/PLATELET
Abs Immature Granulocytes: 0.02 10*3/uL (ref 0.00–0.07)
Basophils Absolute: 0 10*3/uL (ref 0.0–0.1)
Basophils Relative: 1 %
Eosinophils Absolute: 0.1 10*3/uL (ref 0.0–0.5)
Eosinophils Relative: 2 %
HCT: 45.2 % (ref 36.0–46.0)
Hemoglobin: 13.3 g/dL (ref 12.0–15.0)
Immature Granulocytes: 0 %
Lymphocytes Relative: 33 %
Lymphs Abs: 2 10*3/uL (ref 0.7–4.0)
MCH: 27 pg (ref 26.0–34.0)
MCHC: 29.4 g/dL — ABNORMAL LOW (ref 30.0–36.0)
MCV: 91.9 fL (ref 80.0–100.0)
Monocytes Absolute: 0.6 10*3/uL (ref 0.1–1.0)
Monocytes Relative: 10 %
Neutro Abs: 3.2 10*3/uL (ref 1.7–7.7)
Neutrophils Relative %: 54 %
Platelets: 397 10*3/uL (ref 150–400)
RBC: 4.92 MIL/uL (ref 3.87–5.11)
RDW: 14.5 % (ref 11.5–15.5)
WBC: 5.9 10*3/uL (ref 4.0–10.5)
nRBC: 0 % (ref 0.0–0.2)

## 2020-10-06 LAB — RESP PANEL BY RT-PCR (FLU A&B, COVID) ARPGX2
Influenza A by PCR: NEGATIVE
Influenza B by PCR: NEGATIVE
SARS Coronavirus 2 by RT PCR: POSITIVE — AB

## 2020-10-06 LAB — COMPREHENSIVE METABOLIC PANEL
ALT: 12 U/L (ref 0–44)
AST: 16 U/L (ref 15–41)
Albumin: 4.2 g/dL (ref 3.5–5.0)
Alkaline Phosphatase: 70 U/L (ref 38–126)
Anion gap: 10 (ref 5–15)
BUN: 7 mg/dL (ref 6–20)
CO2: 24 mmol/L (ref 22–32)
Calcium: 9.7 mg/dL (ref 8.9–10.3)
Chloride: 100 mmol/L (ref 98–111)
Creatinine, Ser: 0.59 mg/dL (ref 0.44–1.00)
GFR, Estimated: >60 mL/min (ref >60)
Glucose, Bld: 297 mg/dL — ABNORMAL HIGH (ref 70–99)
Potassium: 3.5 mmol/L (ref 3.5–5.1)
Sodium: 134 mmol/L — ABNORMAL LOW (ref 135–145)
Total Bilirubin: 0.7 mg/dL (ref 0.3–1.2)
Total Protein: 7.9 g/dL (ref 6.5–8.1)

## 2020-10-06 LAB — RAPID URINE DRUG SCREEN, HOSP PERFORMED
Amphetamines: NOT DETECTED
Barbiturates: NOT DETECTED
Benzodiazepines: NOT DETECTED
Cocaine: NOT DETECTED
Opiates: NOT DETECTED
Tetrahydrocannabinol: NOT DETECTED

## 2020-10-06 LAB — ETHANOL: Alcohol, Ethyl (B): <10 mg/dL (ref <10)

## 2020-10-06 LAB — HCG, SERUM, QUALITATIVE: Preg, Serum: NEGATIVE

## 2020-10-06 MED ORDER — STERILE WATER FOR INJECTION IJ SOLN
INTRAMUSCULAR | Status: AC
  Filled 2020-10-06: qty 10

## 2020-10-06 MED ORDER — NICOTINE 21 MG/24HR TD PT24
21.0000 mg | MEDICATED_PATCH | Freq: Every day | TRANSDERMAL | Status: DC
Start: 2020-10-07 — End: 2020-10-10
  Administered 2020-10-09 – 2020-10-10 (×2): 21 mg via TRANSDERMAL
  Filled 2020-10-06 (×3): qty 1

## 2020-10-06 MED ORDER — LORAZEPAM 1 MG PO TABS
1.0000 mg | ORAL_TABLET | ORAL | Status: AC | PRN
Start: 2020-10-06 — End: 2020-10-07
  Administered 2020-10-07: 1 mg via ORAL
  Filled 2020-10-06: qty 1

## 2020-10-06 MED ORDER — GLIPIZIDE ER 10 MG PO TB24
10.0000 mg | ORAL_TABLET | Freq: Every day | ORAL | Status: DC
  Administered 2020-10-09 – 2020-10-10 (×2): 10 mg via ORAL
  Filled 2020-10-06 (×4): qty 1

## 2020-10-06 MED ORDER — RISPERIDONE 1 MG PO TBDP
2.0000 mg | ORAL_TABLET | Freq: Three times a day (TID) | ORAL | Status: DC | PRN
  Administered 2020-10-10: 2 mg via ORAL
  Filled 2020-10-06: qty 2

## 2020-10-06 MED ORDER — ACETAMINOPHEN 325 MG PO TABS: 650.0000 mg | ORAL_TABLET | ORAL | Status: DC | PRN

## 2020-10-06 MED ORDER — LORAZEPAM 1 MG PO TABS
2.0000 mg | ORAL_TABLET | Freq: Once | ORAL | Status: AC
  Administered 2020-10-06: 2 mg via ORAL
  Filled 2020-10-06: qty 2

## 2020-10-06 MED ORDER — ZOLPIDEM TARTRATE 5 MG PO TABS
5.0000 mg | ORAL_TABLET | Freq: Every evening | ORAL | Status: DC | PRN
  Administered 2020-10-07 – 2020-10-09 (×2): 5 mg via ORAL
  Filled 2020-10-06 (×2): qty 1

## 2020-10-06 MED ORDER — HYDROXYZINE HCL 25 MG PO TABS
25.0000 mg | ORAL_TABLET | Freq: Every evening | ORAL | Status: DC | PRN
  Administered 2020-10-07 – 2020-10-09 (×2): 25 mg via ORAL
  Filled 2020-10-06 (×2): qty 1

## 2020-10-06 MED ORDER — ZIPRASIDONE MESYLATE 20 MG IM SOLR
20.0000 mg | INTRAMUSCULAR | Status: AC | PRN
  Administered 2020-10-06: 20 mg via INTRAMUSCULAR

## 2020-10-06 MED ORDER — ZIPRASIDONE MESYLATE 20 MG IM SOLR
INTRAMUSCULAR | Status: AC
  Filled 2020-10-06: qty 20

## 2020-10-06 MED ORDER — ALUM & MAG HYDROXIDE-SIMETH 200-200-20 MG/5ML PO SUSP
30.0000 mL | Freq: Four times a day (QID) | ORAL | Status: DC | PRN
  Administered 2020-10-10 (×2): 30 mL via ORAL
  Filled 2020-10-06 (×2): qty 30

## 2020-10-06 NOTE — ED Provider Notes (Signed)
Emergency Medicine Provider Triage Evaluation Note  Jacelyn Cuen , a 45 y.o. female  was evaluated in triage.  Pt complains of manic episode.  Has been going on for the last 5 days.  Patient reports insomnia, racing thoughts, visual hallucinations.  Patient's husband at bedside reports patient has not been taking her medications.  Patient was seen yesterday but left before psychiatric consultation.  Review of Systems  Positive: Insomnia, racing thoughts, visual hallucinations Negative: SI, HI  Physical Exam  LMP 10/05/2020  Gen:   Awake, no distress   Resp:  Normal effort  MSK:   Moves extremities without difficulty  Other:  Pressured speech, tangential thoughts  Medical Decision Making  Medically screening exam initiated at 5:27 PM.  Appropriate orders placed.  Brandyce Escutia was informed that the remainder of the evaluation will be completed by another provider, this initial triage assessment does not replace that evaluation, and the importance of remaining in the ED until their evaluation is complete.  The patient appears stable so that the remainder of the work up may be completed by another provider.      Berneice Heinrich 10/06/20 1728    Bethann Berkshire, MD 10/08/20 1640

## 2020-10-06 NOTE — ED Provider Notes (Signed)
Received signout from previous provider, please see his note for complete H&P.  This is a 45 year old female significant history of bipolar disorder who presents with symptoms of manic behaviors.  Require IVC and subsequently chemical sedation as well as soft restraints as patient repeatedly tried to run off.  Incidentally she does test positive for COVID infection without any covid symptoms.  Patient otherwise medically cleared.    Pt will need to be evaluate by psychiatry for further management of her manic state.    BP (!) 133/101 (BP Location: Left Arm)   Pulse (!) 101   Temp 98 F (36.7 C) (Oral)   Resp 19   LMP 10/05/2020   SpO2 100%   Results for orders placed or performed during the hospital encounter of 10/06/20  Resp Panel by RT-PCR (Flu A&B, Covid) Nasopharyngeal Swab   Specimen: Nasopharyngeal Swab; Nasopharyngeal(NP) swabs in vial transport medium  Result Value Ref Range   SARS Coronavirus 2 by RT PCR POSITIVE (A) NEGATIVE   Influenza A by PCR NEGATIVE NEGATIVE   Influenza B by PCR NEGATIVE NEGATIVE  Comprehensive metabolic panel  Result Value Ref Range   Sodium 134 (L) 135 - 145 mmol/L   Potassium 3.5 3.5 - 5.1 mmol/L   Chloride 100 98 - 111 mmol/L   CO2 24 22 - 32 mmol/L   Glucose, Bld 297 (H) 70 - 99 mg/dL   BUN 7 6 - 20 mg/dL   Creatinine, Ser 6.57 0.44 - 1.00 mg/dL   Calcium 9.7 8.9 - 84.6 mg/dL   Total Protein 7.9 6.5 - 8.1 g/dL   Albumin 4.2 3.5 - 5.0 g/dL   AST 16 15 - 41 U/L   ALT 12 0 - 44 U/L   Alkaline Phosphatase 70 38 - 126 U/L   Total Bilirubin 0.7 0.3 - 1.2 mg/dL   GFR, Estimated >96 >29 mL/min   Anion gap 10 5 - 15  Ethanol  Result Value Ref Range   Alcohol, Ethyl (B) <10 <10 mg/dL  Urine rapid drug screen (hosp performed)  Result Value Ref Range   Opiates NONE DETECTED NONE DETECTED   Cocaine NONE DETECTED NONE DETECTED   Benzodiazepines NONE DETECTED NONE DETECTED   Amphetamines NONE DETECTED NONE DETECTED   Tetrahydrocannabinol NONE  DETECTED NONE DETECTED   Barbiturates NONE DETECTED NONE DETECTED  CBC with Diff  Result Value Ref Range   WBC 5.9 4.0 - 10.5 K/uL   RBC 4.92 3.87 - 5.11 MIL/uL   Hemoglobin 13.3 12.0 - 15.0 g/dL   HCT 52.8 41.3 - 24.4 %   MCV 91.9 80.0 - 100.0 fL   MCH 27.0 26.0 - 34.0 pg   MCHC 29.4 (L) 30.0 - 36.0 g/dL   RDW 01.0 27.2 - 53.6 %   Platelets 397 150 - 400 K/uL   nRBC 0.0 0.0 - 0.2 %   Neutrophils Relative % 54 %   Neutro Abs 3.2 1.7 - 7.7 K/uL   Lymphocytes Relative 33 %   Lymphs Abs 2.0 0.7 - 4.0 K/uL   Monocytes Relative 10 %   Monocytes Absolute 0.6 0.1 - 1.0 K/uL   Eosinophils Relative 2 %   Eosinophils Absolute 0.1 0.0 - 0.5 K/uL   Basophils Relative 1 %   Basophils Absolute 0.0 0.0 - 0.1 K/uL   Immature Granulocytes 0 %   Abs Immature Granulocytes 0.02 0.00 - 0.07 K/uL  hCG, serum, qualitative  Result Value Ref Range   Preg, Serum NEGATIVE NEGATIVE   No  results found.    Fayrene Helper, PA-C 10/06/20 2319    Mancel Bale, MD 10/06/20 [phone]

## 2020-10-06 NOTE — ED Notes (Signed)
Pt refused vitals at this time.

## 2020-10-06 NOTE — ED Triage Notes (Signed)
BIB husband, he states she is manic, cooperative but distracted easily, dancing and singing in room. Seen here recently for the same.

## 2020-10-06 NOTE — ED Notes (Signed)
Pt belongings placed in bag and stored in cabinet at 16-22 nurses's station. Pt has one belongings bag.
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
## 2020-10-07 MED ORDER — OLANZAPINE 10 MG PO TBDP
10.0000 mg | ORAL_TABLET | Freq: Every day | ORAL | Status: DC
  Administered 2020-10-07 – 2020-10-10 (×4): 10 mg via ORAL
  Filled 2020-10-07 (×4): qty 1

## 2020-10-07 MED ORDER — LITHIUM CARBONATE 300 MG PO CAPS
300.0000 mg | ORAL_CAPSULE | Freq: Two times a day (BID) | ORAL | Status: DC
  Administered 2020-10-07 – 2020-10-10 (×6): 300 mg via ORAL
  Filled 2020-10-07 (×6): qty 1

## 2020-10-07 NOTE — ED Notes (Signed)
Pt asleep. Sitter at bedside will provide her breakfast tray when pt wakes up.

## 2020-10-07 NOTE — Consult Note (Signed)
Laura Thompson is a 45 year old with past psychiatric history of bipolar disorder who presented to [facility] 3 x's in 2 days  with symptoms of mania resulting in IVC and chemical sedation as well as restraints after attempting to elope several times. UDS negative, BAL<10.    Plan:   -Disposition:  -inpatient psychiatric care    -Patient is COVID+    -Resume medications:    -Olanzapine 10 mg daily    -previously on Risperidone  2mg  HS;  (last dose unknown)   -Lithium 300 mg BID    -previous dose 900 mg HS (last dose unknown)

## 2020-10-07 NOTE — ED Notes (Signed)
Pt wants her husband to come pick her up. Pt told that she needs to stay with [place] overnight and she will be revaluated in the am. Pt calm and will try to rest

## 2020-10-07 NOTE — ED Notes (Signed)
Pt is calmly sleeping, will wait for pt to wake up to take vital signs.

## 2020-10-07 NOTE — BH Assessment (Addendum)
@  0730, Patient's nurse Chrissie Noa, RN) states that patient is sleeping as the result of being given Geodon earlier. Clinician requested patient's nurse to make TTS aware when patient is able to be assessed.  @0729 , Requested patient's nurse , RN) to place TTS machine in patient's room.

## 2020-10-07 NOTE — BH Assessment (Addendum)
Per RN Julieanne Manson, patient unable to be assessed at this time.  Pt is asleep.  She had been given 20mg  Geodon IM at 23:12.  RN said they would call when patient can be teleassessed.
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
## 2020-10-08 NOTE — Progress Notes (Addendum)
CSW sent inpatient referral to the following facilities:   Service Provider Address Phone Fax  [facility]  [address] [place] [address] [phone]  [facility]  [address] [place] [address] [phone]  [facility]  [address] [place] [address] [phone]  [facility]  [address] [place] [address] [phone]  [facility]  [address] [place] [address] [phone]  [facility]  [address] [place], [place] [address] [phone]  [facility]  [address] [place] [address] [phone]  [facility]  [address] [place], [place] [address]    Maryjean Ka, MSW, [facility] 10/08/2020 6:53 PM

## 2020-10-08 NOTE — Consult Note (Signed)
[facility] ED Progress Note  10/08/2020 5:44 PM Laura Thompson  MRN:  [id number]   Method of visit?: Face to Face   Subjective: per admit note:   Patient seen by myself earlier for sleep disturbance and history of bipolar.  She is now accompanied by her husband.  She is talking to herself, and appears to be delirious.  She has tangential speech and is very agitated, much more so than earlier when family was not at bedside.  They report a lot of stress from daughter recently moving to college.  Patient has been tearful and depressed.  Over the past several days she has been having difficulty sleeping and has become more manic.  Per husband, she should be on medications for bipolar disorder, but does not take routinely.  They filled the hydroxyzine prescribed earlier, but patient threw it out the window of the car and therefore they came back to the hospital.  She has history of similar spells in the past, most recently several years ago.  Typically when she is able to get sleep, she "snaps out of it".  No other medical complaints.   Today, patient is seen face to face by [facility] provider [facility]. Upon arrival patient is walking around hospital room. She stopped and cooperative with this writer's interview. Patient reports, "I got pill for sleep and I didn't trust the pills, and I threw them out the window, she also says her husband brought her here to the hospital".   Alert and oriented x 3, sitting calmly for interview. She reports her insomnia causes her to have anxiety. She feels wonderful today and ready to go home.   Denies suicidal ideation, no intent, no plan, denies homicidal ideation, denies auditory and visual hallucinations. Does not appear to be responding to internal and external stimuli. Able to contract for safety. Denies substance and alcohol use. UDS negative. Reports that she slept well last night in the ED and feels much better now. [place] in [place] in the past, V.  Clementeen Grahamurry. Does not see currently.      Principal Problem: Bipolar affective disorder, current episode manic (HCC) Diagnosis:  Principal Problem:   Bipolar affective disorder, current episode manic (HCC)  Total Time spent with patient: 30 minutes  Past Psychiatric History:   Past Medical History:  Past Medical History:  Diagnosis Date   Diabetes mellitus without complication (HCC)    History reviewed. No pertinent surgical history. Family History:  Family History  Problem Relation Age of Onset   Diabetes Father    Family Psychiatric  History:  Social History:  Social History   Substance and Sexual Activity  Alcohol Use No     Social History   Substance and Sexual Activity  Drug Use No    Social History   Socioeconomic History   Marital status: Married    Spouse name: Not on file   Number of children: Not on file   Years of education: Not on file   Highest education level: Not on file  Occupational History   Not on file  Tobacco Use   Smoking status: Never   Smokeless tobacco: Never  Vaping Use   Vaping Use: Never used  Substance and Sexual Activity   Alcohol use: No   Drug use: No   Sexual activity: Not on file  Other Topics Concern   Not on file  Social History Narrative   Not on file   Social Determinants of Health   Financial  Resource Strain: Not on file  Food Insecurity: Not on file  Transportation Needs: Not on file  Physical Activity: Not on file  Stress: Not on file  Social Connections: Not on file    Sleep: Good  Appetite:  Good  Current Medications: Current Facility-Administered Medications  Medication Dose Route Frequency Provider Last Rate Last Admin   acetaminophen (TYLENOL) tablet 650 mg  650 mg Oral Q4H PRN Fayrene Helper, PA-C       alum & mag hydroxide-simeth (MAALOX/MYLANTA) 200-200-20 MG/5ML suspension 30 mL  30 mL Oral Q6H PRN Fayrene Helper, PA-C       glipiZIDE (GLUCOTROL XL) 24 hr tablet 10 mg  10 mg Oral Q breakfast  Haskel Schroeder, PA-C       hydrOXYzine (ATARAX/VISTARIL) tablet 25-50 mg  25-50 mg Oral QHS PRN Haskel Schroeder, PA-C   25 mg at 10/07/20 2146   lithium carbonate capsule 300 mg  300 mg Oral BID WC Leevy-Johnson, Brooke A, NP   300 mg at 10/08/20 0919   nicotine (NICODERM CQ - dosed in mg/24 hours) patch 21 mg  21 mg Transdermal Daily Fayrene Helper, PA-C       OLANZapine zydis (ZYPREXA) disintegrating tablet 10 mg  10 mg Oral Daily Leevy-Johnson, Brooke A, NP   10 mg at 10/08/20 9211   risperiDONE (RISPERDAL M-TABS) disintegrating tablet 2 mg  2 mg Oral Q8H PRN Fayrene Helper, PA-C       zolpidem (AMBIEN) tablet 5 mg  5 mg Oral QHS PRN Fayrene Helper, PA-C   5 mg at 10/07/20 2143   Current Outpatient Medications  Medication Sig Dispense Refill   Ascorbic Acid (VITAMIN C PO) Take 1 tablet by mouth daily.     Cholecalciferol (VITAMIN D-3 PO) Take 1 capsule by mouth daily.     COLLAGEN PO Take 1 capsule by mouth with breakfast, with lunch, and with evening meal.     cyclobenzaprine (FLEXERIL) 10 MG tablet Take one tab PO TID PRN muscle spasm (Patient taking differently: Take 10 mg by mouth See admin instructions. Take 10 mg by mouth at bedtime for sleep and an additional 10 mg two times a day as needed for muscle spasms) 20 tablet 0   ELDERBERRY PO Take 1 tablet by mouth daily.     ferrous sulfate 325 (65 FE) MG tablet Take 325 mg by mouth daily with breakfast.     Multiple Vitamins-Calcium (ONE-A-DAY WOMENS FORMULA PO) Take 1 tablet by mouth daily with breakfast.     NON FORMULARY Take 1 tablet by mouth See admin instructions. Sea Moss tablets- Take 1 tablet by mouth once a day     VITAMIN E PO Take 1 capsule by mouth daily.     zinc gluconate 50 MG tablet Take 50 mg by mouth daily.     hydrOXYzine (ATARAX/VISTARIL) 25 MG tablet Take 1-2 tablets (25-50 mg total) by mouth at bedtime as needed (sleep). 12 tablet 0   LORazepam (ATIVAN) 1 MG tablet Take 1 tablet (1 mg total) by mouth every 8  (eight) hours as needed for anxiety, sleep or sedation. 5 tablet 0   TYLENOL PM EXTRA STRENGTH 500-25 MG TABS tablet Take 1 tablet by mouth at bedtime as needed (for sleep).      Lab Results:  Results for orders placed or performed during the hospital encounter of 10/06/20 (from the past 48 hour(s))  Comprehensive metabolic panel     Status: Abnormal   Collection Time: 10/06/20  6:46  PM  Result Value Ref Range   Sodium 134 (L) 135 - 145 mmol/L   Potassium 3.5 3.5 - 5.1 mmol/L   Chloride 100 98 - 111 mmol/L   CO2 24 22 - 32 mmol/L   Glucose, Bld 297 (H) 70 - 99 mg/dL    Comment: Glucose reference range applies only to samples taken after fasting for at least 8 hours.   BUN 7 6 - 20 mg/dL   Creatinine, Ser 9.60 0.44 - 1.00 mg/dL   Calcium 9.7 8.9 - 45.4 mg/dL   Total Protein 7.9 6.5 - 8.1 g/dL   Albumin 4.2 3.5 - 5.0 g/dL   AST 16 15 - 41 U/L   ALT 12 0 - 44 U/L   Alkaline Phosphatase 70 38 - 126 U/L   Total Bilirubin 0.7 0.3 - 1.2 mg/dL   GFR, Estimated >09 >81 mL/min    Comment: (NOTE) Calculated using the CKD-EPI Creatinine Equation (2021)    Anion gap 10 5 - 15    Comment: Performed at [facility], [address] [place], [place] [address]  Urine rapid drug screen (hosp performed)     Status: None   Collection Time: 10/06/20  6:46 PM  Result Value Ref Range   Opiates NONE DETECTED NONE DETECTED   Cocaine NONE DETECTED NONE DETECTED   Benzodiazepines NONE DETECTED NONE DETECTED   Amphetamines NONE DETECTED NONE DETECTED   Tetrahydrocannabinol NONE DETECTED NONE DETECTED   Barbiturates NONE DETECTED NONE DETECTED    Comment: (NOTE) DRUG SCREEN FOR MEDICAL PURPOSES ONLY.  IF CONFIRMATION IS NEEDED FOR ANY PURPOSE, NOTIFY LAB WITHIN 5 DAYS.  LOWEST DETECTABLE LIMITS FOR URINE DRUG SCREEN Drug Class                     Cutoff (ng/mL) Amphetamine and metabolites    1000 Barbiturate and metabolites    200 Benzodiazepine                 200 Tricyclics  and metabolites     300 Opiates and metabolites        300 Cocaine and metabolites        300 THC                            50 Performed at [facility], [address] [place], [place] [address]   CBC with Diff     Status: Abnormal   Collection Time: 10/06/20  6:46 PM  Result Value Ref Range   WBC 5.9 4.0 - 10.5 K/uL   RBC 4.92 3.87 - 5.11 MIL/uL   Hemoglobin 13.3 12.0 - 15.0 g/dL   HCT 21.3 08.6 - 57.8 %   MCV 91.9 80.0 - 100.0 fL   MCH 27.0 26.0 - 34.0 pg   MCHC 29.4 (L) 30.0 - 36.0 g/dL   RDW 46.9 62.9 - 52.8 %   Platelets 397 150 - 400 K/uL   nRBC 0.0 0.0 - 0.2 %   Neutrophils Relative % 54 %   Neutro Abs 3.2 1.7 - 7.7 K/uL   Lymphocytes Relative 33 %   Lymphs Abs 2.0 0.7 - 4.0 K/uL   Monocytes Relative 10 %   Monocytes Absolute 0.6 0.1 - 1.0 K/uL   Eosinophils Relative 2 %   Eosinophils Absolute 0.1 0.0 - 0.5 K/uL   Basophils Relative 1 %   Basophils Absolute 0.0 0.0 - 0.1 K/uL  Immature Granulocytes 0 %   Abs Immature Granulocytes 0.02 0.00 - 0.07 K/uL    Comment: Performed at [facility], [address] [place], [place] [address]  Ethanol     Status: None   Collection Time: 10/06/20  8:28 PM  Result Value Ref Range   Alcohol, Ethyl (B) <10 <10 mg/dL    Comment: (NOTE) Lowest detectable limit for serum alcohol is 10 mg/dL.  For medical purposes only. Performed at [facility], [address], [place], [place] [address]   Resp Panel by RT-PCR (Flu A&B, Covid) Nasopharyngeal Swab     Status: Abnormal   Collection Time: 10/06/20  8:28 PM   Specimen: Nasopharyngeal Swab; Nasopharyngeal(NP) swabs in vial transport medium  Result Value Ref Range   SARS Coronavirus 2 by RT PCR POSITIVE (A) NEGATIVE    Comment: CRITICAL RESULT CALLED TO, READ BACK BY AND VERIFIED WITH: PRAY J. ON 10/06/2020 @ 0944 BY MECIAL J. (NOTE) SARS-CoV-2 target nucleic acids are DETECTED.  The SARS-CoV-2 RNA is generally  detectable in upper respiratory specimens during the acute phase of infection. Positive results are indicative of the presence of the identified virus, but do not rule out bacterial infection or co-infection with other pathogens not detected by the test. Clinical correlation with patient history and other diagnostic information is necessary to determine patient infection status. The expected result is Negative.  Fact Sheet for Patients: BloggerCourse.com  Fact Sheet for Healthcare Providers: SeriousBroker.it  This test is not yet approved or cleared by the [place] FDA and  has been authorized for detection and/or diagnosis of SARS-CoV-2 by FDA under an Emergency Use Authorization (EUA).  This EUA will remain in effect (meaning  this test can be used) for the duration of  the COVID-19 declaration under Section 564(b)(1) of the Act, 21 U.S.C. section 360bbb-3(b)(1), unless the authorization is terminated or revoked sooner.     Influenza A by PCR NEGATIVE NEGATIVE   Influenza B by PCR NEGATIVE NEGATIVE    Comment: (NOTE) The Xpert Xpress SARS-CoV-2/FLU/RSV plus assay is intended as an aid in the diagnosis of influenza from Nasopharyngeal swab specimens and should not be used as a sole basis for treatment. Nasal washings and aspirates are unacceptable for Xpert Xpress SARS-CoV-2/FLU/RSV testing.  Fact Sheet for Patients: BloggerCourse.com  Fact Sheet for Healthcare Providers: SeriousBroker.it  This test is not yet approved or cleared by the [place] FDA and has been authorized for detection and/or diagnosis of SARS-CoV-2 by FDA under an Emergency Use Authorization (EUA). This EUA will remain in effect (meaning this test can be used) for the duration of the COVID-19 declaration under Section 564(b)(1) of the Act, 21 U.S.C. section 360bbb-3(b)(1), unless the authorization  is terminated or revoked.  Performed at [facility], [address], [place], [place] [address]   hCG, serum, qualitative     Status: None   Collection Time: 10/06/20  8:28 PM  Result Value Ref Range   Preg, Serum NEGATIVE NEGATIVE    Comment: Performed at [facility], [address], [place], [place] [address]    Blood Alcohol level:  Lab Results  Component Value Date   ETH <10 10/06/2020   ETH <10 10/08/2018    Physical Findings: AIMS:  , ,  ,  ,    CIWA:    COWS:     Musculoskeletal: Strength & Muscle Tone: within normal limits Gait & Station: normal Patient leans: N/A  Psychiatric  Specialty Exam:  Presentation  General Appearance:  Appropriate for Environment Eye Contact: Good Speech: Clear and Coherent; Normal Rate Speech Volume: Normal Handedness: No data recorded  Mood and Affect  Mood: Euthymic Affect: Congruent  Thought Process  Thought Processes: Coherent; Goal Directed Descriptions of Associations:Intact Orientation:Full (Time, Place and Person) Thought Content:Logical History of Schizophrenia/Schizoaffective disorder:No  Duration of Psychotic Symptoms:No data recorded Hallucinations:Hallucinations: None Ideas of Reference:None Suicidal Thoughts:Suicidal Thoughts: No Homicidal Thoughts:Homicidal Thoughts: No  Sensorium  Memory: Immediate Good; Recent Good; Remote Good Judgment: Intact Insight: Good  Executive Functions  Concentration: Good Attention Span: Good Recall: Good Fund of Knowledge: Good Language: Good  Psychomotor Activity  Psychomotor Activity: Psychomotor Activity: Normal  Assets  Assets: Communication Skills; Desire for Improvement; Financial Resources/Insurance; Housing; Intimacy; Physical Health; Resilience; Social Support; Transportation  Sleep  Sleep: Sleep: Good   Physical Exam: Physical Exam Vitals reviewed.  Cardiovascular:     Rate and Rhythm:  Tachycardia present.  Pulmonary:     Effort: Pulmonary effort is normal.  Skin:    General: Skin is warm and dry.  Neurological:     Mental Status: She is alert and oriented to person, place, and time.  Psychiatric:        Attention and Perception: Attention normal.        Mood and Affect: Mood normal. Mood is not depressed.        Speech: Speech normal.        Behavior: Behavior is cooperative.        Thought Content: Thought content is not paranoid or delusional. Thought content does not include homicidal or suicidal ideation. Thought content does not include homicidal or suicidal plan.   Review of Systems  Constitutional:  Negative for chills and fever.  Respiratory:  Negative for shortness of breath.   Cardiovascular:  Negative for chest pain.  Gastrointestinal:  Negative for abdominal pain.  Neurological:  Negative for headaches.  Psychiatric/Behavioral:  Negative for depression, hallucinations, substance abuse and suicidal ideas. The patient is not nervous/anxious and does not have insomnia.   Blood pressure 127/84, pulse (!) 109, temperature (!) 97.5 F (36.4 C), temperature source Oral, resp. rate 18, last menstrual period 10/05/2020, SpO2 98 %. There is no height or weight on file to calculate BMI.  Treatment Plan Summary: Plan Continuous oberservation for overnight for stabilization back on medications. Psych to reassess in the morning for likely discharge in morning if she continues to stabilize.   Collateral: spoke with husband, Harriett Sine. He is very concerned for patient to go home today due to her erratic behaviors and driving errantly with their daughter in car. Daughter was scared and called the police on mother. Patient has been off of medications for a while and says that her behaviors have been devastating and draining the family. He fears she is still not stable after only being on medications one day. (Patient received one dose of Lithium and Zyprexa this morning). Has  required one dose of ativan for anxiety on 10/07/20. Husband also reports AVH.   EDP, ED staff, behavioral health coordinator notified of disposition via secure chat.   Novella Olive, NP 10/08/2020, 5:44 PM

## 2020-10-08 NOTE — ED Notes (Signed)
Pt received lunch tray 

## 2020-10-08 NOTE — ED Notes (Signed)
Breakfast tray provided. 

## 2020-10-09 NOTE — BH Assessment (Signed)
[facility] Assessment Progress Note   Per Dorena Bodo, NP this involuntary pt is not psychiatrically cleared.  Pt is however, currently testing positive for Covid-19.  Pt will remain in ED for further observation and stabilization.  EDP Lynden Oxford, MD and pt's nurse, Aram Beecham, have been notified.  Doylene Canning, [place] Behavioral Health Coordinator [phone]

## 2020-10-09 NOTE — ED Notes (Signed)
The patient has continued to think she is being discharged and telling her husband to come and pick her up.  The patient is cooperative, pleasant but seems a little anxious.  I have called the husband and updated him and he knows that she will not be discharge today.

## 2020-10-09 NOTE — ED Notes (Signed)
Nurse has updated the patient.  The patient keeps calling out, asking about discharge paperwork.  The patient was told that she was be reassessed and recommendations given per [facility] counselor

## 2020-10-09 NOTE — ED Provider Notes (Signed)
Emergency Medicine Observation Re-evaluation Note  Kalecia Hartney is a 45 y.o. female, seen on rounds today.  Pt initially presented to the ED for complaints of Psychiatric Evaluation Currently, the patient is awaiting for reassessment and psychiatric recommendations.  Physical Exam  BP 121/78   Pulse 72   Temp (!) 97.5 F (36.4 C) (Oral)   Resp 18   LMP 10/05/2020   SpO2 100%  Physical Exam General: Resting Cardiac: No murmur Lungs: Clear Psych: Walking around cleaning her exam room  ED Course / MDM  EKG:EKG Interpretation  Date/Time:  Saturday October 06 2020 19:24:40 EDT Ventricular Rate:  74 PR Interval:  145 QRS Duration: 102 QT Interval:  383 QTC Calculation: 425 R Axis:   -16 Text Interpretation: Sinus rhythm Borderline left axis deviation Low voltage, precordial leads since last tracing no significant change Confirmed by Mancel Bale [phone]) on 10/06/2020 7:35:20 PM  I have reviewed the labs performed to date as well as medications administered while in observation.  Recent changes in the last 24 hours include none.  Plan  Current plan is for awaiting psychiatric recommendations this morning.  Tennile Herd is not under involuntary commitment.     Marybella Ethier, Canary Brim, MD 10/09/20 1031

## 2020-10-09 NOTE — ED Notes (Signed)
Patient is alert, cooperative and calm.  No distress noted when checked

## 2020-10-09 NOTE — ED Notes (Signed)
Patient continues to call the nursing staff and ask why is she not being discharged.  I have attempted multiple times to explain the IVC

## 2020-10-09 NOTE — ED Notes (Signed)
Will arrange [facility] to take the patient home

## 2020-10-09 NOTE — Consult Note (Signed)
[facility] Psych ED Progress Note  10/09/2020 12:34 PM Laura Thompson  MRN:  [id number]   Method of visit?: Face to Face   Subjective: per admit note:  Patient with history of bipolar disorder, diabetes --presents to the emergency department for medication to help her sleep.  She states that over the past 4 nights she has not been sleeping much.  She is unable to tell me how much she has been sleeping "because I cannot focus".  She denies any hallucinations or other medical complaints or signs of infection.  It appears that she has been on hydroxyzine in the past as well as trazodone. The onset of this condition was acute. The course is constant. Aggravating factors: none. Alleviating factors: none.   Today, patient is seen face to face by this provider at [facility] ED. She is alert and elated, she is singing and "dancing" in her bed upon arrival to room. She is circumstantial in speech and manic. During the interview she goes from smiling to tears when speaking about her daughter.   Denies suicidal ideation, denies homicidal ideation, denies auditory and visual hallucinations. She has been back on her medications for bipolar for 2 days now. Reports that she has never been on medications for bipolar, upon chart review it is noted that she was therapeutic one year ago on lithium.        Principal Problem: Bipolar affective disorder, current episode manic (HCC) Diagnosis:  Principal Problem:   Bipolar affective disorder, current episode manic (HCC)  Total Time spent with patient: 30 minutes  Past Psychiatric History: bipolar  Past Medical History:  Past Medical History:  Diagnosis Date   Diabetes mellitus without complication (HCC)    History reviewed. No pertinent surgical history. Family History:  Family History  Problem Relation Age of Onset   Diabetes Father    Family Psychiatric  History:  Social History:  Social History   Substance and Sexual Activity  Alcohol Use No     Social  History   Substance and Sexual Activity  Drug Use No    Social History   Socioeconomic History   Marital status: Married    Spouse name: Not on file   Number of children: Not on file   Years of education: Not on file   Highest education level: Not on file  Occupational History   Not on file  Tobacco Use   Smoking status: Never   Smokeless tobacco: Never  Vaping Use   Vaping Use: Never used  Substance and Sexual Activity   Alcohol use: No   Drug use: No   Sexual activity: Not on file  Other Topics Concern   Not on file  Social History Narrative   Not on file   Social Determinants of Health   Financial Resource Strain: Not on file  Food Insecurity: Not on file  Transportation Needs: Not on file  Physical Activity: Not on file  Stress: Not on file  Social Connections: Not on file    Sleep: Good  Appetite:  Good  Current Medications: Current Facility-Administered Medications  Medication Dose Route Frequency Provider Last Rate Last Admin   acetaminophen (TYLENOL) tablet 650 mg  650 mg Oral Q4H PRN Fayrene Helper, PA-C       alum & mag hydroxide-simeth (MAALOX/MYLANTA) 200-200-20 MG/5ML suspension 30 mL  30 mL Oral Q6H PRN Fayrene Helper, PA-C       glipiZIDE (GLUCOTROL XL) 24 hr tablet 10 mg  10 mg Oral Q breakfast Badalamente,  Rose Phi, PA-C   10 mg at 10/09/20 0900   hydrOXYzine (ATARAX/VISTARIL) tablet 25-50 mg  25-50 mg Oral QHS PRN Haskel Schroeder, PA-C   25 mg at 10/07/20 2146   lithium carbonate capsule 300 mg  300 mg Oral BID WC Leevy-Johnson, Brooke A, NP   300 mg at 10/09/20 0900   nicotine (NICODERM CQ - dosed in mg/24 hours) patch 21 mg  21 mg Transdermal Daily Fayrene Helper, PA-C   21 mg at 10/09/20 5364   OLANZapine zydis (ZYPREXA) disintegrating tablet 10 mg  10 mg Oral Daily Leevy-Johnson, Brooke A, NP   10 mg at 10/09/20 6803   risperiDONE (RISPERDAL M-TABS) disintegrating tablet 2 mg  2 mg Oral Q8H PRN Fayrene Helper, PA-C       zolpidem (AMBIEN) tablet 5  mg  5 mg Oral QHS PRN Fayrene Helper, PA-C   5 mg at 10/07/20 2143   Current Outpatient Medications  Medication Sig Dispense Refill   Ascorbic Acid (VITAMIN C PO) Take 1 tablet by mouth daily.     Cholecalciferol (VITAMIN D-3 PO) Take 1 capsule by mouth daily.     COLLAGEN PO Take 1 capsule by mouth with breakfast, with lunch, and with evening meal.     cyclobenzaprine (FLEXERIL) 10 MG tablet Take one tab PO TID PRN muscle spasm (Patient taking differently: Take 10 mg by mouth See admin instructions. Take 10 mg by mouth at bedtime for sleep and an additional 10 mg two times a day as needed for muscle spasms) 20 tablet 0   ELDERBERRY PO Take 1 tablet by mouth daily.     ferrous sulfate 325 (65 FE) MG tablet Take 325 mg by mouth daily with breakfast.     Multiple Vitamins-Calcium (ONE-A-DAY WOMENS FORMULA PO) Take 1 tablet by mouth daily with breakfast.     NON FORMULARY Take 1 tablet by mouth See admin instructions. Sea Moss tablets- Take 1 tablet by mouth once a day     VITAMIN E PO Take 1 capsule by mouth daily.     zinc gluconate 50 MG tablet Take 50 mg by mouth daily.     hydrOXYzine (ATARAX/VISTARIL) 25 MG tablet Take 1-2 tablets (25-50 mg total) by mouth at bedtime as needed (sleep). 12 tablet 0   LORazepam (ATIVAN) 1 MG tablet Take 1 tablet (1 mg total) by mouth every 8 (eight) hours as needed for anxiety, sleep or sedation. 5 tablet 0   TYLENOL PM EXTRA STRENGTH 500-25 MG TABS tablet Take 1 tablet by mouth at bedtime as needed (for sleep).      Lab Results: No results found for this or any previous visit (from the past 48 hour(s)).  Blood Alcohol level:  Lab Results  Component Value Date   ETH <10 10/06/2020   ETH <10 10/08/2018    Physical Findings: AIMS:  , ,  ,  ,    CIWA:    COWS:     Musculoskeletal: Strength & Muscle Tone: within normal limits Gait & Station: normal Patient leans: N/A  Psychiatric Specialty Exam:  Presentation  General Appearance: Appropriate for  Environment  Eye Contact:Good  Speech:Clear and Coherent; Pressured  Speech Volume:Normal  Handedness: No data recorded  Mood and Affect  Mood:Euphoric  Affect:Congruent   Thought Process  Thought Processes:Coherent; Goal Directed  Descriptions of Associations:Intact  Orientation:Full (Time, Place and Person)  Thought Content:Logical  History of Schizophrenia/Schizoaffective disorder:No  Duration of Psychotic Symptoms:No data recorded Hallucinations:Hallucinations: None  Ideas of  Reference:None  Suicidal Thoughts:Suicidal Thoughts: No  Homicidal Thoughts:Homicidal Thoughts: No   Sensorium  Memory:Immediate Good; Recent Good; Remote Good  Judgment:Intact  Insight:Good   Executive Functions  Concentration:Good  Attention Span:Good  Recall:Good  Fund of Knowledge:Good  Language:Good   Psychomotor Activity  Psychomotor Activity:Psychomotor Activity: Normal   Assets  Assets:Communication Skills; Desire for Improvement; Financial Resources/Insurance; Housing; Intimacy; Leisure Time; Physical Health; Resilience; Social Support; Transportation   Sleep  Sleep:Sleep: Good    Physical Exam: Physical Exam Vitals reviewed.  Cardiovascular:     Rate and Rhythm: Normal rate.  Pulmonary:     Effort: Pulmonary effort is normal.  Skin:    General: Skin is warm and dry.  Neurological:     Mental Status: She is oriented to person, place, and time.  Psychiatric:        Attention and Perception: Attention normal.        Mood and Affect: Mood is elated. Affect is labile.        Speech: Speech is rapid and pressured.        Behavior: Behavior is cooperative.        Thought Content: Thought content is not paranoid. Thought content does not include homicidal or suicidal ideation. Thought content does not include homicidal or suicidal plan.   Review of Systems  Constitutional:  Negative for chills and fever.  Respiratory:  Negative for shortness of  breath.   Cardiovascular:  Negative for chest pain.  Gastrointestinal:  Negative for abdominal pain, nausea and vomiting.  Neurological:  Negative for headaches.  Psychiatric/Behavioral:  Negative for depression, hallucinations, substance abuse and suicidal ideas. The patient is not nervous/anxious and does not have insomnia.   Blood pressure (!) 113/91, pulse 100, temperature 97.9 F (36.6 C), temperature source Oral, resp. rate 18, last menstrual period 10/05/2020, SpO2 99 %. There is no height or weight on file to calculate BMI.  Treatment Plan Summary: Daily contact with patient to assess and evaluate symptoms and progress in treatment, Medication management, and Plan Lithium level ordered for tomorrow, will adjust medication as needed. Psychiatry to reassess daily for stabilization with plan to discharge when stable if this occurs before Covid quarantine is complete.   Novella Olive, NP 10/09/2020, 12:34 PM

## 2020-10-10 LAB — LITHIUM LEVEL: Lithium Lvl: 0.59 mmol/L — ABNORMAL LOW (ref 0.60–1.20)

## 2020-10-10 MED ORDER — LITHIUM CARBONATE 300 MG PO CAPS
300.0000 mg | ORAL_CAPSULE | Freq: Three times a day (TID) | ORAL | Status: DC
  Administered 2020-10-10: 300 mg via ORAL
  Filled 2020-10-10: qty 1

## 2020-10-10 NOTE — Consult Note (Addendum)
[facility] Psych ED Discharge  10/10/2020 4:21 PM Laura Thompson  MRN:  [id number]  Method of visit?: Virtual (Location of provider: [facility] Location of patient: [facility] Names and roles of anyone participating in the consult/assessment: Yvone Neu, patient, Dorena Bodo, NP, Nelly Rout, MD This service was provided via telemedicine using a 2-way, interactive audio, and video technology. )  Principal Problem: Bipolar affective disorder, current episode manic [facility]) Discharge Diagnoses: Principal Problem:   Bipolar affective disorder, current episode manic (HCC)   Subjective: per admit note:  Received signout from previous provider, please see his note for complete H&P.  This is a 45 year old female significant history of bipolar disorder who presents with symptoms of manic behaviors.  Require IVC and subsequently chemical sedation as well as soft restraints as patient repeatedly tried to run off.  Incidentally she does test positive for COVID infection without any covid symptoms.  Patient otherwise medically cleared.    Today, Patient is alert and oriented, sitting calmly on ED stretcher. I have assessed this patient for the past 3 days, and today she is much calmer, happy with congruent affect. Speech normal, volume normal. She is not exhibiting signs of mania today. She reports that she feels better, more focused, thoughts are clear, and she is not sad anymore. She talked about grieving that her daughter has gone off to college and she now knows this is a normal part of life. She thanked me for listening and for the kindness throughout this week.   Denies suicidal ideation, no intent, no plan, denies homicidal ideation, denies auditory and visual hallucinations. Does not appear to be responding to internal or external stimuli.   Discussed with patient the importance of compliance with lithium even when she starts to feel better. She understands that she will need to follow-up with either psychiatry  or PCP for blood levels to be drawn to make sure she stays in therapeutic range. She is committed to staying on medication for stability.     Total Time spent with patient: 20 minutes  Past Psychiatric History: bipolar  Past Medical History:  Past Medical History:  Diagnosis Date   Diabetes mellitus without complication (HCC)    History reviewed. No pertinent surgical history. Family History:  Family History  Problem Relation Age of Onset   Diabetes Father    Family Psychiatric  History: unknown Social History:  Social History   Substance and Sexual Activity  Alcohol Use No     Social History   Substance and Sexual Activity  Drug Use No    Social History   Socioeconomic History   Marital status: Married    Spouse name: Not on file   Number of children: Not on file   Years of education: Not on file   Highest education level: Not on file  Occupational History   Not on file  Tobacco Use   Smoking status: Never   Smokeless tobacco: Never  Vaping Use   Vaping Use: Never used  Substance and Sexual Activity   Alcohol use: No   Drug use: No   Sexual activity: Not on file  Other Topics Concern   Not on file  Social History Narrative   Not on file   Social Determinants of Health   Financial Resource Strain: Not on file  Food Insecurity: Not on file  Transportation Needs: Not on file  Physical Activity: Not on file  Stress: Not on file  Social Connections: Not on file    Tobacco Cessation:  N/A, patient does not currently use tobacco products  Current Medications: Current Facility-Administered Medications  Medication Dose Route Frequency Provider Last Rate Last Admin   acetaminophen (TYLENOL) tablet 650 mg  650 mg Oral Q4H PRN Fayrene Helperran, Bowie, PA-C       alum & mag hydroxide-simeth (MAALOX/MYLANTA) 200-200-20 MG/5ML suspension 30 mL  30 mL Oral Q6H PRN Fayrene Helperran, Bowie, PA-C   30 mL at 10/10/20 1107   glipiZIDE (GLUCOTROL XL) 24 hr tablet 10 mg  10 mg Oral Q  breakfast Haskel SchroederBadalamente, Peter R, PA-C   10 mg at 10/10/20 [id number]   hydrOXYzine (ATARAX/VISTARIL) tablet 25-50 mg  25-50 mg Oral QHS PRN Haskel SchroederBadalamente, Peter R, PA-C   25 mg at 10/09/20 2129   lithium carbonate capsule 300 mg  300 mg Oral TID WC Novella Oliveolby, Aasir Daigler R, NP   300 mg at 10/10/20 1250   nicotine (NICODERM CQ - dosed in mg/24 hours) patch 21 mg  21 mg Transdermal Daily Fayrene Helperran, Bowie, PA-C   21 mg at 10/10/20 1106   OLANZapine zydis (ZYPREXA) disintegrating tablet 10 mg  10 mg Oral Daily Leevy-Johnson, Brooke A, NP   10 mg at 10/10/20 1106   risperiDONE (RISPERDAL M-TABS) disintegrating tablet 2 mg  2 mg Oral Q8H PRN Fayrene Helperran, Bowie, PA-C   2 mg at 10/10/20 0911   zolpidem (AMBIEN) tablet 5 mg  5 mg Oral QHS PRN Fayrene Helperran, Bowie, PA-C   5 mg at 10/09/20 2130   Current Outpatient Medications  Medication Sig Dispense Refill   Ascorbic Acid (VITAMIN C PO) Take 1 tablet by mouth daily.     Cholecalciferol (VITAMIN D-3 PO) Take 1 capsule by mouth daily.     COLLAGEN PO Take 1 capsule by mouth with breakfast, with lunch, and with evening meal.     cyclobenzaprine (FLEXERIL) 10 MG tablet Take one tab PO TID PRN muscle spasm (Patient taking differently: Take 10 mg by mouth See admin instructions. Take 10 mg by mouth at bedtime for sleep and an additional 10 mg two times a day as needed for muscle spasms) 20 tablet 0   ELDERBERRY PO Take 1 tablet by mouth daily.     ferrous sulfate 325 (65 FE) MG tablet Take 325 mg by mouth daily with breakfast.     Multiple Vitamins-Calcium (ONE-A-DAY WOMENS FORMULA PO) Take 1 tablet by mouth daily with breakfast.     NON FORMULARY Take 1 tablet by mouth See admin instructions. Sea Moss tablets- Take 1 tablet by mouth once a day     VITAMIN E PO Take 1 capsule by mouth daily.     zinc gluconate 50 MG tablet Take 50 mg by mouth daily.     hydrOXYzine (ATARAX/VISTARIL) 25 MG tablet Take 1-2 tablets (25-50 mg total) by mouth at bedtime as needed (sleep). 12 tablet 0   LORazepam (ATIVAN)  1 MG tablet Take 1 tablet (1 mg total) by mouth every 8 (eight) hours as needed for anxiety, sleep or sedation. 5 tablet 0   TYLENOL PM EXTRA STRENGTH 500-25 MG TABS tablet Take 1 tablet by mouth at bedtime as needed (for sleep).     PTA Medications: (Not in a hospital admission)   Musculoskeletal: Strength & Muscle Tone: within normal limits Gait & Station: normal Patient leans: N/A  Psychiatric Specialty Exam:  Presentation  General Appearance: Appropriate for Environment  Eye Contact:Good  Speech:Clear and Coherent; Normal Rate  Speech Volume:Normal  Handedness: No data recorded  Mood and Affect  Mood:Euthymic  Affect:Congruent  Thought Process  Thought Processes:Coherent; Goal Directed  Descriptions of Associations:Intact  Orientation:Full (Time, Place and Person)  Thought Content:Logical  History of Schizophrenia/Schizoaffective disorder:No  Duration of Psychotic Symptoms:No data recorded Hallucinations:Hallucinations: None  Ideas of Reference:None  Suicidal Thoughts:Suicidal Thoughts: No  Homicidal Thoughts:Homicidal Thoughts: No   Sensorium  Memory:Immediate Good; Recent Good; Remote Good  Judgment:Intact  Insight:Good   Executive Functions  Concentration:Good  Attention Span:Good  Recall:Good  Fund of Knowledge:Good  Language:Good   Psychomotor Activity  Psychomotor Activity:Psychomotor Activity: Normal   Assets  Assets:Communication Skills; Desire for Improvement; Housing; Health and safety inspector; Intimacy; Physical Health; Resilience; Social Support; Transportation; Vocational/Educational   Sleep  Sleep:Sleep: Good    Physical Exam: Physical Exam Vitals reviewed.  Constitutional:      Appearance: Normal appearance.  Pulmonary:     Effort: Pulmonary effort is normal.  Neurological:     Mental Status: She is alert and oriented to person, place, and time.  Psychiatric:        Attention and Perception:  Attention normal. She does not perceive auditory or visual hallucinations.        Mood and Affect: Mood normal.        Speech: Speech normal.        Behavior: Behavior normal. Behavior is cooperative.        Thought Content: Thought content is not paranoid or delusional. Thought content does not include homicidal or suicidal ideation. Thought content does not include homicidal or suicidal plan.        Cognition and Memory: Cognition normal.        Judgment: Judgment normal.   Review of Systems  Constitutional:  Negative for chills and fever.  Respiratory:  Negative for shortness of breath.   Cardiovascular:  Negative for chest pain.  Gastrointestinal:  Negative for abdominal pain, nausea and vomiting.  Neurological:  Negative for headaches.  Psychiatric/Behavioral:  Negative for depression, hallucinations, substance abuse and suicidal ideas. The patient is not nervous/anxious and does not have insomnia.   Blood pressure (!) 147/92, pulse (!) 105, temperature 97.9 F (36.6 C), temperature source Oral, resp. rate 16, last menstrual period 10/05/2020, SpO2 100 %. There is no height or weight on file to calculate BMI.   Demographic Factors:  NA  Loss Factors: NA  Historical Factors: NA  Risk Reduction Factors:   Living with another person, especially a relative, Positive social support, and Positive therapeutic relationship  Continued Clinical Symptoms:  Bipolar Disorder:   Mixed State  Cognitive Features That Contribute To Risk:  None    Suicide Risk:  Mild:  Suicidal ideation of limited frequency, intensity, duration, and specificity.  There are no identifiable plans, no associated intent, mild dysphoria and related symptoms, good self-control (both objective and subjective assessment), few other risk factors, and identifiable protective factors, including available and accessible social support.    Plan Of Care/Follow-up recommendations:  Other:  Safe for outpatient  psychiatric treatment. Lithium level 0.59 (0.60-1.20)  today. Dose adjusted:  Lithium 300 mg TID (from BID). Long discussion with patient concerning importance of continuing Lithium therapy even when she starts to feel better in order to remain stable. Understands this medication requires blood levels to be drawn.   Collateral: spoke with husband, Harriett Sine # [phone] who is not happy that patient will be discharged. He wants "guarantee" that this patient is stable. Explained to husband that patient is calm, cooperative, able to converse without difficulty, she is not exhibiting signs of mania today and is  not  suicidal and is not hallucinating. Husband attempted to obtain this provider's last name for record of this medical decision making. This Clinical research associate explained that we are not required to divulge this information in behavioral health setting. After several attempts on his part to obtain my personal information the call was ended.   Disposition: Psychiatrically cleared. Has appointment with PCP, Dr. Parke Simmers  November 09, 2020.  Wants psychiatric referral at discharge. Information will be provided at discharge on AVS. Consulted with Nelly Rout, MD concerning this patient. Patient agrees with plan to discharge home with close follow-up with psychiatry and PCP. Behavioral Health Coordinator secured patient appointment with partial hospital program, information will be added to AVS.   Novella Olive, NP 10/10/2020, 4:21 PM

## 2020-10-10 NOTE — ED Notes (Signed)
Pt given maalox for her heart burn and pt also given more ice water. Denies any other needs.

## 2020-10-10 NOTE — ED Notes (Signed)
Pt c/o about having to use bedside commode due to being covid+. Pt agitated. Pt called 911 while in room c/o of same. Phone removed.

## 2020-10-10 NOTE — ED Notes (Signed)
Patient called 911 stating she needed to use to bathroom and that she did not want to use it in "the bucket"

## 2020-10-10 NOTE — ED Notes (Signed)
Pt given new scrubs and shampoo/conditioner/body wash/cream cause she wanted to take a sink bath

## 2020-10-10 NOTE — ED Provider Notes (Signed)
Emergency Medicine Observation Re-evaluation Note  Laura Thompson is a 45 y.o. female, seen on rounds today.  Pt initially presented to the ED for complaints of Psychiatric Evaluation Currently, the patient is awaiting reassessment for psychiatric stabilization.  COVID-positive.Marland Kitchen  Physical Exam  BP 135/90   Pulse 94   Temp 97.9 F (36.6 C) (Oral)   Resp 17   LMP 10/05/2020   SpO2 100%  Physical Exam General: No acute distress Cardiac: Well-perfused Lungs: Nonlabored Psych: Redirectable  ED Course / MDM  EKG:EKG Interpretation  Date/Time:  Saturday October 06 2020 19:24:40 EDT Ventricular Rate:  74 PR Interval:  145 QRS Duration: 102 QT Interval:  383 QTC Calculation: 425 R Axis:   -16 Text Interpretation: Sinus rhythm Borderline left axis deviation Low voltage, precordial leads since last tracing no significant change Confirmed by Mancel Bale [id number]) on 10/06/2020 7:35:20 PM  I have reviewed the labs performed to date as well as medications administered while in observation.  Recent changes in the last 24 hours include psychiatry evaluation continuing to recommend inpatient.  Plan  Current plan is for psychiatric placement unfortunately cannot place due to COVID.Marland Kitchen  Laura Thompson is not under involuntary commitment.     Terrilee Files, MD 10/10/20 1745

## 2020-10-10 NOTE — BH Assessment (Addendum)
[facility] Assessment Progress Note   Per Dorena Bodo, NP, this pt does not require psychiatric hospitalization at this time.  Pt presents under IVC initiated by EDP Mancel Bale, MD which has been rescinded by Nelly Rout, MD.  Pt is psychiatrically cleared.  This Clinical research associate spoke to pt in person about outpatient services available in this community.  Pt is interested in the Partial Hospitalization Program offered through the [facility], however, I am not able to reach them at this time.  Information about the program has been included in pt's discharge instructions, along with information about local providers of routine psychiatry and therapy.  EDP Gloris Manchester, MD and pt's nurse, Ladona Ridgel, have been notified.  Doylene Canning, [place] Triage Specialist [phone]    Addendum:  Donia Guiles, LCSW reaches out with intake appointment scheduled for Tuesday 10/16/2020 at 14:00, which I have inserted into pt's discharge instructions.  Parties noted above have been notified.  Doylene Canning, [place] Behavioral Health Coordinator [phone]

## 2020-10-10 NOTE — Discharge Instructions (Addendum)
For your behavioral health needs, you are advised to follow up with the Partial Hospitalization Program (PHP) at the [facility].  This program meets Monday - Friday from 9:00 am - 1:00 pm.  Due to Covid-19 this program is currently virtual.  You are scheduled for a virtual intake appointment on Tuesday, October 16, 2020 at 2:00 pm.  If you have any questions or you would like to schedule an intake appointment, contact Donia Guiles, LCSW at the phone number indicated below:       [facility]      [address]      [place], [place] [address]      Contact person: Donia Guiles, LCSW      [phone]  If this program does not work for you at this time, contact one of the provides below to schedule an intake appointment.  They offer routine psychiatry and therapy:       [facility] [address]      [place], [place] [address]      [phone]        [facility]      [address]      [place], [place] [address]      [phone]        [facility]      [phone] [address]      [place], [place] [address]      [phone]

## 2020-10-10 NOTE — ED Notes (Signed)
Patient was informed by [facility] that she needs to use the bedside commode due to precautions

## 2020-10-10 NOTE — ED Notes (Signed)
Pt given breakfast tray

## 2020-10-16 ENCOUNTER — Telehealth (HOSPITAL_COMMUNITY): Payer: Self-pay | Admitting: Professional

## 2020-10-16 ENCOUNTER — Other Ambulatory Visit (HOSPITAL_COMMUNITY): Payer: Self-pay | Attending: Psychiatry

## 2020-10-16 ENCOUNTER — Other Ambulatory Visit: Payer: Self-pay

## 2020-11-21 ENCOUNTER — Encounter: Payer: BC Managed Care – PPO | Admitting: Obstetrics and Gynecology

## 2020-12-03 ENCOUNTER — Other Ambulatory Visit: Payer: Self-pay

## 2020-12-03 ENCOUNTER — Encounter (HOSPITAL_COMMUNITY): Payer: Self-pay | Admitting: Emergency Medicine

## 2020-12-03 ENCOUNTER — Emergency Department (HOSPITAL_COMMUNITY)
Admission: EM | Admit: 2020-12-03 | Discharge: 2020-12-04 | Disposition: A | Discharge status: Other Acute Inpatient Hospital | Payer: BC Managed Care – PPO | Attending: Emergency Medicine | Admitting: Emergency Medicine

## 2020-12-03 DIAGNOSIS — Z1330 Encounter for screening examination for mental health and behavioral disorders, unspecified: Secondary | ICD-10-CM | POA: Diagnosis not present

## 2020-12-03 DIAGNOSIS — Z20822 Contact with and (suspected) exposure to covid-19: Secondary | ICD-10-CM | POA: Diagnosis not present

## 2020-12-03 DIAGNOSIS — F29 Unspecified psychosis not due to a substance or known physiological condition: Secondary | ICD-10-CM | POA: Insufficient documentation

## 2020-12-03 DIAGNOSIS — Z79899 Other long term (current) drug therapy: Secondary | ICD-10-CM | POA: Diagnosis not present

## 2020-12-03 DIAGNOSIS — F312 Bipolar disorder, current episode manic severe with psychotic features: Secondary | ICD-10-CM | POA: Diagnosis present

## 2020-12-03 DIAGNOSIS — Z8616 Personal history of COVID-19: Secondary | ICD-10-CM | POA: Insufficient documentation

## 2020-12-03 DIAGNOSIS — F23 Brief psychotic disorder: Secondary | ICD-10-CM

## 2020-12-03 DIAGNOSIS — E119 Type 2 diabetes mellitus without complications: Secondary | ICD-10-CM | POA: Insufficient documentation

## 2020-12-03 LAB — COMPREHENSIVE METABOLIC PANEL
ALT: 15 U/L (ref 0–44)
AST: 20 U/L (ref 15–41)
Albumin: 4.2 g/dL (ref 3.5–5.0)
Alkaline Phosphatase: 54 U/L (ref 38–126)
Anion gap: 9 (ref 5–15)
BUN: 9 mg/dL (ref 6–20)
CO2: 25 mmol/L (ref 22–32)
Calcium: 9.6 mg/dL (ref 8.9–10.3)
Chloride: 103 mmol/L (ref 98–111)
Creatinine, Ser: 0.58 mg/dL (ref 0.44–1.00)
GFR, Estimated: >60 mL/min (ref >60)
Glucose, Bld: 174 mg/dL — ABNORMAL HIGH (ref 70–99)
Potassium: 3.2 mmol/L — ABNORMAL LOW (ref 3.5–5.1)
Sodium: 137 mmol/L (ref 135–145)
Total Bilirubin: 0.8 mg/dL (ref 0.3–1.2)
Total Protein: 8.1 g/dL (ref 6.5–8.1)

## 2020-12-03 LAB — CBC WITH DIFFERENTIAL/PLATELET
Abs Immature Granulocytes: 0.01 10*3/uL (ref 0.00–0.07)
Basophils Absolute: 0.1 10*3/uL (ref 0.0–0.1)
Basophils Relative: 1 %
Eosinophils Absolute: 0 10*3/uL (ref 0.0–0.5)
Eosinophils Relative: 1 %
HCT: 39.5 % (ref 36.0–46.0)
Hemoglobin: 12.8 g/dL (ref 12.0–15.0)
Immature Granulocytes: 0 %
Lymphocytes Relative: 34 %
Lymphs Abs: 2.1 10*3/uL (ref 0.7–4.0)
MCH: 26.8 pg (ref 26.0–34.0)
MCHC: 32.4 g/dL (ref 30.0–36.0)
MCV: 82.8 fL (ref 80.0–100.0)
Monocytes Absolute: 0.6 10*3/uL (ref 0.1–1.0)
Monocytes Relative: 9 %
Neutro Abs: 3.3 10*3/uL (ref 1.7–7.7)
Neutrophils Relative %: 55 %
Platelets: 387 10*3/uL (ref 150–400)
RBC: 4.77 MIL/uL (ref 3.87–5.11)
RDW: 13 % (ref 11.5–15.5)
WBC: 6.1 10*3/uL (ref 4.0–10.5)
nRBC: 0 % (ref 0.0–0.2)

## 2020-12-03 LAB — RAPID URINE DRUG SCREEN, HOSP PERFORMED
Amphetamines: NOT DETECTED
Barbiturates: NOT DETECTED
Benzodiazepines: NOT DETECTED
Cocaine: NOT DETECTED
Opiates: NOT DETECTED
Tetrahydrocannabinol: NOT DETECTED

## 2020-12-03 LAB — URINALYSIS, COMPLETE (UACMP) WITH MICROSCOPIC
Bilirubin Urine: NEGATIVE
Glucose, UA: NEGATIVE mg/dL
Ketones, ur: NEGATIVE mg/dL
Nitrite: NEGATIVE
Protein, ur: NEGATIVE mg/dL
Specific Gravity, Urine: 1.002 — ABNORMAL LOW (ref 1.005–1.030)
pH: 7 (ref 5.0–8.0)

## 2020-12-03 LAB — ETHANOL: Alcohol, Ethyl (B): <10 mg/dL (ref <10)

## 2020-12-03 LAB — RESP PANEL BY RT-PCR (FLU A&B, COVID) ARPGX2
Influenza A by PCR: NEGATIVE
Influenza B by PCR: NEGATIVE
SARS Coronavirus 2 by RT PCR: NEGATIVE

## 2020-12-03 LAB — I-STAT BETA HCG BLOOD, ED (MC, WL, AP ONLY): I-stat hCG, quantitative: <5 m[IU]/mL (ref <5)

## 2020-12-03 MED ORDER — LORAZEPAM 1 MG PO TABS: 1.0000 mg | ORAL_TABLET | Freq: Once | ORAL | Status: AC | PRN

## 2020-12-03 MED ORDER — LORAZEPAM 2 MG/ML IJ SOLN
1.0000 mg | Freq: Once | INTRAMUSCULAR | Status: AC | PRN
  Administered 2020-12-03: 1 mg via INTRAMUSCULAR
  Filled 2020-12-03: qty 1

## 2020-12-03 MED ORDER — ZIPRASIDONE MESYLATE 20 MG IM SOLR
10.0000 mg | Freq: Once | INTRAMUSCULAR | Status: AC
  Administered 2020-12-03: 10 mg via INTRAMUSCULAR
  Filled 2020-12-03: qty 20

## 2020-12-03 MED ORDER — STERILE WATER FOR INJECTION IJ SOLN
INTRAMUSCULAR | Status: AC
  Administered 2020-12-03: 1.2 mL
  Filled 2020-12-03: qty 10
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
## 2020-12-03 NOTE — ED Notes (Signed)
Pt asked Jamesetta Orleans, NT could the door be closed when staff went to check on her. Staff explained that protocol for what she is here for, there are rules and regulations and that she cannot have the door closed. Pt proceeded to walk out the ED lobby and outside. Pt was stopped by staff outside and then security escorted patient back to Triage 5.

## 2020-12-03 NOTE — ED Notes (Signed)
Patients husband would like a call back from his wife. He needs a passcode to access their account.  Delene Loll [phone]

## 2020-12-03 NOTE — ED Notes (Signed)
One bag of patient belongings was placed behind the Triage nurses station. The patient's purse will go home with the husband.
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
## 2020-12-04 NOTE — ED Notes (Signed)
Pt cooperative when vitals assessed.

## 2020-12-04 NOTE — ED Notes (Signed)
Sheriff called for transport request. EMTALA being filled by Adela Lank

## 2020-12-04 NOTE — ED Notes (Addendum)
Vitals not updated due to patients behavior earlier. Pt is asleep. I will update vitals once patient is awake and cooperative. RN, Sheria Lang aware.

## 2020-12-04 NOTE — Progress Notes (Signed)
Inpatient Behavioral Health Placement  Pt meets inpatient criteria per Fransisca Kaufmann, NP. Per [facility] Malva Limes, RN there are no appropriate beds at [facility].    Referral was sent to the following facilities:   Destination Service Provider Address Phone Fax Patient Preferred  [facility]  [address] [place] [address] [phone] --  [facility]  [address], [place] [address] [phone] --  [facility]  [address] [place] [address] [phone] --  [facility]  [address] [place] [address] [phone] --  [facility]  [address], [place] [address] [phone] --  [facility]  [address], [place] [address] [phone] --  [facility]  [address] [place], [place] [address] [phone] --  [facility]  [address] [place], [place] [address] [phone] --  [facility]  [phone] [address] [place] [address] [phone] --  [facility]  [address] [place] [address] [phone] --  [facility]  [address], [place] [address] [phone] --  [facility]  [address] [place] [address] [phone]    Situation ongoing,  CSW will follow up.   Maryjean Ka, MSW, LCSWA 12/04/2020  @ 12:35 AM

## 2020-12-04 NOTE — ED Notes (Signed)
Patient accepted by Dr. Leane Call at [facility] in [place], [place].  [phone].

## 2020-12-18 ENCOUNTER — Other Ambulatory Visit: Payer: Self-pay

## 2020-12-18 ENCOUNTER — Encounter (HOSPITAL_COMMUNITY): Payer: Self-pay

## 2020-12-18 ENCOUNTER — Ambulatory Visit (HOSPITAL_COMMUNITY): Payer: BC Managed Care – PPO | Admitting: Licensed Clinical Social Worker
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
## 2021-05-11 ENCOUNTER — Encounter (HOSPITAL_COMMUNITY): Payer: Self-pay | Admitting: Psychiatry

## 2021-05-11 ENCOUNTER — Telehealth (HOSPITAL_BASED_OUTPATIENT_CLINIC_OR_DEPARTMENT_OTHER): Payer: BC Managed Care – PPO | Admitting: Psychiatry

## 2021-05-11 ENCOUNTER — Other Ambulatory Visit: Payer: Self-pay

## 2021-05-11 DIAGNOSIS — G4700 Insomnia, unspecified: Secondary | ICD-10-CM

## 2021-05-11 DIAGNOSIS — F3130 Bipolar disorder, current episode depressed, mild or moderate severity, unspecified: Secondary | ICD-10-CM | POA: Diagnosis not present

## 2021-05-11 MED ORDER — CARIPRAZINE HCL 1.5 MG PO CAPS: 1.5000 mg | ORAL_CAPSULE | Freq: Every day | ORAL | 0 refills | Status: AC

## 2021-05-11 MED ORDER — CARIPRAZINE HCL 3 MG PO CAPS: 3.0000 mg | ORAL_CAPSULE | Freq: Every day | ORAL | 1 refills | Status: AC

## 2021-05-13 ENCOUNTER — Telehealth: Payer: Self-pay

## 2021-05-13 NOTE — Telephone Encounter (Signed)
received notice that a prior auth was needed for the vraylar1.5mg  ?

## 2021-05-13 NOTE — Telephone Encounter (Signed)
went online to [email or web address] and submitted the PA- pending ?

## 2021-05-13 NOTE — Telephone Encounter (Signed)
received request for a prior auth on the vraylar 3mg  -pending ?

## 2021-05-14 NOTE — Telephone Encounter (Signed)
received notice that prior auth was approved from 05-13-21 to 05-13-24 ?

## 2021-05-21 ENCOUNTER — Telehealth: Payer: Self-pay | Admitting: Psychiatry

## 2021-05-21 NOTE — Telephone Encounter (Signed)
Called LVM to sch appt VIRTUAL with provider ?

## 2021-07-30 ENCOUNTER — Institutional Professional Consult (permissible substitution): Payer: BC Managed Care – PPO | Admitting: Neurology
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
## 2021-11-05 ENCOUNTER — Emergency Department (HOSPITAL_COMMUNITY)
Admission: EM | Admit: 2021-11-05 | Discharge: 2021-11-05 | Discharge status: Against Medical Advice | Payer: BC Managed Care – PPO | Attending: Emergency Medicine | Admitting: Emergency Medicine

## 2021-11-05 ENCOUNTER — Encounter (HOSPITAL_COMMUNITY): Payer: Self-pay | Admitting: Emergency Medicine

## 2021-11-05 DIAGNOSIS — F309 Manic episode, unspecified: Secondary | ICD-10-CM | POA: Insufficient documentation

## 2021-11-05 DIAGNOSIS — Z5321 Procedure and treatment not carried out due to patient leaving prior to being seen by health care provider: Secondary | ICD-10-CM | POA: Insufficient documentation

## 2021-11-05 DIAGNOSIS — E119 Type 2 diabetes mellitus without complications: Secondary | ICD-10-CM | POA: Insufficient documentation

## 2021-11-05 DIAGNOSIS — E1169 Type 2 diabetes mellitus with other specified complication: Secondary | ICD-10-CM | POA: Insufficient documentation

## 2021-11-05 DIAGNOSIS — Z7984 Long term (current) use of oral hypoglycemic drugs: Secondary | ICD-10-CM | POA: Insufficient documentation

## 2021-11-05 DIAGNOSIS — F3010 Manic episode without psychotic symptoms, unspecified: Secondary | ICD-10-CM

## 2021-11-05 NOTE — ED Triage Notes (Signed)
Pt BIB husband. She has a history of bipolar mania. Her husband states that her current behavior is typical of her manic episodes. She has not been sleeping over the last week and a half. She is exhibiting odd behavior and becoming more paranoid and questioning things. She is cooperative at this time, but states that she is only here because he wants her to be. She has been IVC'd before for same.
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
## 2021-11-07 ENCOUNTER — Other Ambulatory Visit: Payer: Self-pay

## 2021-11-07 ENCOUNTER — Encounter (HOSPITAL_COMMUNITY): Payer: Self-pay

## 2021-11-07 ENCOUNTER — Emergency Department (HOSPITAL_COMMUNITY)
Admission: EM | Admit: 2021-11-07 | Discharge: 2021-11-08 | Disposition: A | Discharge status: Home / Self Care | Payer: BC Managed Care – PPO | Attending: Emergency Medicine | Admitting: Emergency Medicine

## 2021-11-07 DIAGNOSIS — F3110 Bipolar disorder, current episode manic without psychotic features, unspecified: Secondary | ICD-10-CM | POA: Diagnosis present

## 2021-11-07 DIAGNOSIS — F3113 Bipolar disorder, current episode manic without psychotic features, severe: Secondary | ICD-10-CM | POA: Diagnosis not present

## 2021-11-07 DIAGNOSIS — Z20822 Contact with and (suspected) exposure to covid-19: Secondary | ICD-10-CM | POA: Insufficient documentation

## 2021-11-07 DIAGNOSIS — Z046 Encounter for general psychiatric examination, requested by authority: Secondary | ICD-10-CM | POA: Diagnosis present

## 2021-11-07 HISTORY — DX: Bipolar disorder, unspecified: F31.9

## 2021-11-07 LAB — CBC
HCT: 40.1 % (ref 36.0–46.0)
Hemoglobin: 12.7 g/dL (ref 12.0–15.0)
MCH: 27.2 pg (ref 26.0–34.0)
MCHC: 31.7 g/dL (ref 30.0–36.0)
MCV: 85.9 fL (ref 80.0–100.0)
Platelets: 383 10*3/uL (ref 150–400)
RBC: 4.67 MIL/uL (ref 3.87–5.11)
RDW: 13.4 % (ref 11.5–15.5)
WBC: 6.1 10*3/uL (ref 4.0–10.5)
nRBC: 0 % (ref 0.0–0.2)

## 2021-11-07 LAB — RAPID URINE DRUG SCREEN, HOSP PERFORMED
Amphetamines: NOT DETECTED
Barbiturates: NOT DETECTED
Benzodiazepines: NOT DETECTED
Cocaine: NOT DETECTED
Opiates: NOT DETECTED
Tetrahydrocannabinol: POSITIVE — AB

## 2021-11-07 LAB — BASIC METABOLIC PANEL
Anion gap: 6 (ref 5–15)
BUN: 13 mg/dL (ref 6–20)
CO2: 26 mmol/L (ref 22–32)
Calcium: 9.4 mg/dL (ref 8.9–10.3)
Chloride: 106 mmol/L (ref 98–111)
Creatinine, Ser: 0.7 mg/dL (ref 0.44–1.00)
GFR, Estimated: >60 mL/min (ref >60)
Glucose, Bld: 117 mg/dL — ABNORMAL HIGH (ref 70–99)
Potassium: 3.4 mmol/L — ABNORMAL LOW (ref 3.5–5.1)
Sodium: 138 mmol/L (ref 135–145)

## 2021-11-07 LAB — ACETAMINOPHEN LEVEL: Acetaminophen (Tylenol), Serum: <10 ug/mL — ABNORMAL LOW (ref 10–30)

## 2021-11-07 LAB — ETHANOL: Alcohol, Ethyl (B): <10 mg/dL (ref <10)

## 2021-11-07 LAB — SALICYLATE LEVEL: Salicylate Lvl: <7 mg/dL — ABNORMAL LOW (ref 7.0–30.0)

## 2021-11-07 MED ORDER — LORAZEPAM 1 MG PO TABS
1.0000 mg | ORAL_TABLET | Freq: Once | ORAL | Status: AC | PRN
  Administered 2021-11-07: 1 mg via ORAL
  Filled 2021-11-07: qty 1

## 2021-11-07 MED ORDER — STERILE WATER FOR INJECTION IJ SOLN
INTRAMUSCULAR | Status: AC
  Filled 2021-11-07: qty 10

## 2021-11-07 MED ORDER — DOXEPIN HCL 10 MG PO CAPS
10.0000 mg | ORAL_CAPSULE | Freq: Every evening | ORAL | Status: DC | PRN
  Administered 2021-11-07: 10 mg via ORAL
  Filled 2021-11-07 (×2): qty 1

## 2021-11-07 MED ORDER — OLANZAPINE 10 MG PO TBDP: 10.0000 mg | ORAL_TABLET | Freq: Every day | ORAL | Status: DC

## 2021-11-07 MED ORDER — ZIPRASIDONE MESYLATE 20 MG IM SOLR
20.0000 mg | INTRAMUSCULAR | Status: DC | PRN
  Administered 2021-11-07: 20 mg via INTRAMUSCULAR
  Filled 2021-11-07: qty 20
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
## 2021-11-07 NOTE — Consult Note (Signed)
BH ED ASSESSMENT   Reason for Consult:  Manic Referring Physician:  Dr. Jeraldine LootsLockwood Patient Identification: Laura Thompson MRN:  [id number] ED Chief Complaint: Bipolar affective disorder, current episode manic (HCC)  Diagnosis:  Principal Problem:   Bipolar affective disorder, current episode manic [facility](HCC)   ED Assessment Time Calculation: Start Time: 1900 Stop Time: 1930 Total Time in Minutes (Assessment Completion): 30   Subjective:   Laura Thompson is a 46 y.o. female patient with a history of bipolar 1 disorder who [facility] with her husband due to progressively worsening mania.   HPI:   Patient's husband participates in the assessment with the patient's expressed permission.  Patient's husband reports that the patient has only been sleeping approximately 2 hours a night for the past week.  He states that over the past few days she has started getting up at 5 in the morning to go to the gym, which is unusual for her.  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  He states that she has been driving aggressively, more irritable, and her spending has increased.  Patient's husband reports that her current symptoms are not as severe as previous episodes.  He feels that she needs stabilizing.  Patient reports that she feels fine.  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  She reports that she did make plans to go to [place] this morning, but states that she just recently found the iPad.  She reports that she has started going to the gym at 5 in the morning because she feels that she would like to be around people that are dedicated to their exercise routine.  She states that she feels people that go that early in the  morning are dedicated.  She states that she has not been sleeping and that is her issue.  She states that she becomes irritable when she is not sleeping.  She does not appear to understand the connection between bipolar disorder and her insomnia.  She states that she is sleeping approximately 3 to 4 hours per night.  She states that this is because she has not been taking her Ambien because she does not like the way it makes her feel.  Patient reports that she stopped taking olanzapine approximately 1 year ago.  She had an appointment with her medication provider on 11/05/2021 and she was restarted on olanzapine 5 mg nightly.  She reports that she was started on Vraylar last year, but she had suicidal thoughts and only took it for about 3 days.  She reports that Abilify was very helpful, but after some time she began to have suicidal thoughts and she contributed this to the Abilify.  She reports several inpatient admissions.  The last was at [facility] and 12/06/2020.  At that time the patient was experiencing psychosis.   On evaluation, patient is alert and oriented x 4.  She is well-groomed.  Eye contact is good.  Her speech is pressured and circumstantial.  She reports her mood is anxious.  Affect is congruent, labile,and tearful.  Patient is very pleasant at times and laughs appropriately.  She is easily agitated and argumentative.  She is also very tearful at times.  Her thought process is coherent  and illogical.  Her thought content is circumstantial.  She denies auditory and visual hallucinations.  No indication that she is responding to internal stimuli.  No delusions elicited during this assessment.  She denies suicidal ideations.  She denies homicidal ideations.  She denies use of alcohol, nicotine, marijuana, and other substances.  Patient expresses extreme discontent in regards to inpatient psychiatric treatment.  She reports bad experiences.  Reports that her mental health condition is not as severe  as other people that are hospitalized.   Past Psychiatric History: Bipolar Disorder. Several inpatient admissions due to mania.  Risk to Self or Others: Is the patient at risk to self? Yes Has the patient been a risk to self in the past 6 months? Yes Has the patient been a risk to self within the distant past? Yes Is the patient a risk to others? Yes Has the patient been a risk to others in the past 6 months? Yes Has the patient been a risk to others within the distant past? Yes  [place] Scale:  Flowsheet Row ED from 11/07/2021 in [facility] from 11/05/2021 in [facility] from 12/03/[facility] in [facility]  C-SSRS RISK CATEGORY No Risk No Risk No Risk       AIMS:  , , ,  ,   ASAM:    Substance Abuse:     Past Medical History:  Past Medical History:  Diagnosis Date   Bipolar 1 disorder (HCC)    Diabetes mellitus without complication (HCC)     Past Surgical History:  Procedure Laterality Date   CERVICAL ABLATION     DENTAL SURGERY     lipoma removal     Family History:  Family History  Problem Relation Age of Onset   Diabetes Father    Family Psychiatric  History: pt's father has history of bipolar disorder Social History:  Social History   Substance and Sexual Activity  Alcohol Use No     Social History   Substance and Sexual Activity  Drug Use No    Social History   Socioeconomic History   Marital status: Married    Spouse name: Not on file   Number of children: Not on file   Years of education: Not on file   Highest education level: Not on file  Occupational History   Not on file  Tobacco Use   Smoking status: Never   Smokeless tobacco: Never  Vaping Use   Vaping Use: Never used  Substance and Sexual Activity   Alcohol use: No   Drug use: No   Sexual activity: Not on file  Other Topics Concern   Not on file  Social History Narrative   Not  on file   Social Determinants of Health   Financial Resource Strain: Not on file  Food Insecurity: Not on file  Transportation Needs: Not on file  Physical Activity: Not on file  Stress: Not on file  Social Connections: Not on file   Additional Social History:    Allergies:   Allergies  Allergen Reactions   Aripiprazole Other (See Comments)    Manic-like Sx happened in March 2019   Diphenhydramine Hives   Doxycycline Hives   Iodinated Contrast Media Hives   Sulfa Antibiotics Hives   Septra [Sulfamethoxazole-Trimethoprim] Hives   Metformin And Related Diarrhea    Labs:  Results for orders placed or performed during the hospital encounter of 11/07/21 (from the past 48  hour(s))  Rapid urine drug screen (hospital performed)     Status: Abnormal   Collection Time: 11/07/21  3:00 PM  Result Value Ref Range   Opiates NONE DETECTED NONE DETECTED   Cocaine NONE DETECTED NONE DETECTED   Benzodiazepines NONE DETECTED NONE DETECTED   Amphetamines NONE DETECTED NONE DETECTED   Tetrahydrocannabinol POSITIVE (A) NONE DETECTED   Barbiturates NONE DETECTED NONE DETECTED    Comment: (NOTE) DRUG SCREEN FOR MEDICAL PURPOSES ONLY.  IF CONFIRMATION IS NEEDED FOR ANY PURPOSE, NOTIFY LAB WITHIN 5 DAYS.  LOWEST DETECTABLE LIMITS FOR URINE DRUG SCREEN Drug Class                     Cutoff (ng/mL) Amphetamine and metabolites    1000 Barbiturate and metabolites    200 Benzodiazepine                 200 Tricyclics and metabolites     300 Opiates and metabolites        300 Cocaine and metabolites        300 THC                            50 Performed at [facility], [address] [place], [place] [address]   CBC     Status: None   Collection Time: 11/07/21  4:42 PM  Result Value Ref Range   WBC 6.1 4.0 - 10.5 K/uL   RBC 4.67 3.87 - 5.11 MIL/uL   Hemoglobin 12.7 12.0 - 15.0 g/dL   HCT 00.9 38.1 - 82.9 %   MCV 85.9 80.0 - 100.0 fL   MCH 27.2 26.0 - 34.0 pg    MCHC 31.7 30.0 - 36.0 g/dL   RDW 93.7 16.9 - 67.8 %   Platelets 383 150 - 400 K/uL   nRBC 0.0 0.0 - 0.2 %    Comment: Performed at [facility], [address], [place], [place] [address]  Basic metabolic panel     Status: Abnormal   Collection Time: 11/07/21  4:42 PM  Result Value Ref Range   Sodium 138 135 - 145 mmol/L   Potassium 3.4 (L) 3.5 - 5.1 mmol/L   Chloride 106 98 - 111 mmol/L   CO2 26 22 - 32 mmol/L   Glucose, Bld 117 (H) 70 - 99 mg/dL    Comment: Glucose reference range applies only to samples taken after fasting for at least 8 hours.   BUN 13 6 - 20 mg/dL   Creatinine, Ser 1.75 0.44 - 1.00 mg/dL   Calcium 9.4 8.9 - 10.2 mg/dL   GFR, Estimated >58 >52 mL/min    Comment: (NOTE) Calculated using the CKD-EPI Creatinine Equation (2021)    Anion gap 6 5 - 15    Comment: Performed at [facility], [address], [place], [place] [address]  Salicylate level     Status: Abnormal   Collection Time: 11/07/21  4:43 PM  Result Value Ref Range   Salicylate Lvl <7.0 (L) 7.0 - 30.0 mg/dL    Comment: Performed at [facility], [address] [place], [place] [address]  Acetaminophen level     Status: Abnormal   Collection Time: 11/07/21  4:43 PM  Result Value Ref Range   Acetaminophen (Tylenol), Serum <10 (L) 10 - 30 ug/mL    Comment: (NOTE) Therapeutic concentrations vary significantly. A range of 10-30 ug/mL  may be an effective concentration for many patients. However, some  are best treated at concentrations outside of this range. Acetaminophen concentrations >150 ug/mL at 4 hours after ingestion  and >50 ug/mL at 12 hours after ingestion are often associated with  toxic reactions.  Performed at [facility], [address], [place], [place] [address]   Ethanol     Status: None   Collection Time: 11/07/21  4:43 PM  Result Value Ref Range   Alcohol, Ethyl (B) <10 <10 mg/dL    Comment:  (NOTE) Lowest detectable limit for serum alcohol is 10 mg/dL.  For medical purposes only. Performed at [facility], [address] [place], [place] [address]     Current Facility-Administered Medications  Medication Dose Route Frequency Provider Last Rate Last Admin   doxepin (SINEQUAN) capsule 10 mg  10 mg Oral QHS PRN Jackelyn Poling, NP       LORazepam (ATIVAN) tablet 1 mg  1 mg Oral Once PRN Jackelyn Poling, NP       OLANZapine zydis (ZYPREXA) disintegrating tablet 10 mg  10 mg Oral QHS Nira Conn A, NP       sterile water (preservative free) injection            ziprasidone (GEODON) injection 20 mg  20 mg Intramuscular PRN Marita Kansas, PA-C   20 mg at 11/07/21 1941   Current Outpatient Medications  Medication Sig Dispense Refill   Ascorbic Acid (VITAMIN C PO) Take 1 tablet by mouth daily.     Cholecalciferol (VITAMIN D3 PO) Take 1 capsule by mouth daily.     fluticasone (FLONASE) 50 MCG/ACT nasal spray Place 1-2 sprays into both nostrils daily.     Ibuprofen 200 MG CAPS Take 200-400 mg by mouth every 6 (six) hours as needed (for mild pain or headaches).     ODORLESS GARLIC PO Take 1 capsule by mouth daily.     Omega-3 Fatty Acids (FISH OIL PO) Take 1 capsule by mouth daily.     rosuvastatin (CRESTOR) 10 MG tablet Take 10 mg by mouth daily.     TRULICITY 0.75 MG/0.5ML SOPN Inject 0.75 mg into the skin every Saturday.     VITAMIN A PO Take 1 tablet by mouth daily.     zinc gluconate 50 MG tablet Take 50 mg by mouth daily.     zolpidem (AMBIEN) 10 MG tablet Take 10 mg by mouth at bedtime as needed for sleep.     cyclobenzaprine (FLEXERIL) 10 MG tablet Take one tab PO TID PRN muscle spasm (Patient not taking: Reported on 11/07/2021) 20 tablet 0    Musculoskeletal: Strength & Muscle Tone: within normal limits Gait & Station: normal Patient leans: N/A   Psychiatric Specialty Exam: Presentation  General Appearance: Appropriate for Environment; Casual  Eye  Contact:Good  Speech:Clear and Coherent; Pressured  Speech Volume:Increased  Handedness:No data recorded  Mood and Affect  Mood:Anxious  Affect:Labile; Full Range; Tearful   Thought Process  Thought Processes:Coherent  Descriptions of Associations:Circumstantial  Orientation:Full (Time, Place and Person)  Thought Content:Illogical  History of Schizophrenia/Schizoaffective disorder:No  Duration of Psychotic Symptoms:No data recorded Hallucinations:Hallucinations: None  Ideas of Reference:None  Suicidal Thoughts:Suicidal Thoughts: No  Homicidal Thoughts:Homicidal Thoughts: No   Sensorium  Memory:Immediate Good; Recent Good; Remote Good  Judgment:Impaired  Insight:Lacking   Executive Functions  Concentration:Fair  Attention Span:Fair  Recall:Good  Fund of Knowledge:Good  Language:Good   Psychomotor Activity  Psychomotor Activity:Psychomotor Activity: Restlessness  Assets  Assets:Communication Skills; Desire for Improvement; Financial Resources/Insurance; Social Support; Physical Health; Housing    Sleep  Sleep:Sleep: Poor   Physical Exam: Physical Exam Constitutional:      General: She is not in acute distress.    Appearance: She is not ill-appearing, toxic-appearing or diaphoretic.  HENT:     Right Ear: External ear normal.     Left Ear: External ear normal.  Eyes:     General:        Right eye: No discharge.        Left eye: No discharge.     Pupils: Pupils are equal, round, and reactive to light.  Cardiovascular:     Rate and Rhythm: Normal rate.  Pulmonary:     Effort: Pulmonary effort is normal. No respiratory distress.  Musculoskeletal:        General: Normal range of motion.     Cervical back: Normal range of motion.  Neurological:     Mental Status: She is alert and oriented to person, place, and time.  Psychiatric:        Mood and Affect: Mood is anxious. Affect is labile.        Thought Content: Thought content is not  paranoid. Thought content does not include homicidal or suicidal ideation.    Review of Systems  Constitutional:  Negative for chills, diaphoresis, fever, malaise/fatigue and weight loss.  HENT:  Negative for congestion.   Respiratory:  Negative for cough and shortness of breath.   Cardiovascular:  Negative for chest pain.  Gastrointestinal:  Negative for diarrhea, nausea and vomiting.  Neurological:  Negative for dizziness and seizures.  Psychiatric/Behavioral:  Negative for depression, hallucinations, memory loss, substance abuse and suicidal ideas. The patient is nervous/anxious and has insomnia.   All other systems reviewed and are negative.  Blood pressure (!) 129/91, pulse 83, temperature 98 F (36.7 C), temperature source Oral, resp. rate 20, height 5\' 3"  (1.6 m), weight 70.8 kg, last menstrual period 10/24/2021, SpO2 99 %. Body mass index is 27.63 kg/m.  Medical Decision Making: Laura Thompson is a 46 y.o. female patient with a history of bipolar 1 disorder who presents voluntarily to [facility] with her husband due to progressively worsening mania.  Patient was petitioned for involuntary commitment by the ED provider.  On evaluation, patient is alert and oriented x 4.  She is well-groomed.  Eye contact is good.  Her speech is pressured and circumstantial.  She reports her mood is anxious.  Affect is congruent, labile,and tearful.  Patient is very pleasant at times and laughs appropriately.  She is easily agitated and argumentative.  She is also very tearful at times.  Her thought process is coherent and illogical.  Her thought content is circumstantial.  She denies auditory and visual hallucinations.  No indication that she is responding to internal stimuli.  No delusions elicited during this assessment.  She denies suicidal ideations.  She denies homicidal ideations.  She denies use of alcohol, nicotine, marijuana, and other substances.  Patient expresses extreme discontent in regards to  inpatient psychiatric treatment.  She reports bad experiences.  Reports that her mental health condition is not as severe as other people that are hospitalized.   Increase olanzapine to 10 mg nightly for mood stability Start doxepin 10 mg nightly as needed for sleep  Agitation protocol: Geodon 20 mg IM as needed agitation Ativan 1 mg oral as needed anxiety/agitation  Problem 1: Bipolar Disorder   Disposition: Recommend psychiatric Inpatient admission  when medically cleared.  Jackelyn Poling, NP 11/07/2021 7:44 PM

## 2021-11-07 NOTE — ED Notes (Signed)
Pt refusing to change into scrubs, refusing to give urine. Pt states " you aint getting anything from me today" and is demanding to see MD. Provider aware, security at bedside

## 2021-11-07 NOTE — ED Triage Notes (Signed)
Patient was brought in by her husband. Patient is getting up in the early morning hours and is afraid she is going to have a wreck. "Patient states, "I was just driving fast and having fun." Insomnia Patient's husband reports that she has been off of her meds x 1 year.  Patient is exhibiting manic behavior.

## 2021-11-07 NOTE — ED Notes (Signed)
Patient has IVC by Dr. Vanita Panda on 11/07/21.Expires on 11/14/21.

## 2021-11-08 LAB — RESP PANEL BY RT-PCR (FLU A&B, COVID) ARPGX2
Influenza A by PCR: NEGATIVE
Influenza B by PCR: NEGATIVE
SARS Coronavirus 2 by RT PCR: NEGATIVE

## 2021-11-08 LAB — CBG MONITORING, ED: Glucose-Capillary: 205 mg/dL — ABNORMAL HIGH (ref 70–99)

## 2021-11-08 NOTE — Progress Notes (Signed)
Inpatient Behavioral Health Placement  Pt meets inpatient criteria per Laura Romp, NP.There are no available beds at [facility] per [facility] Lavell Luster, RN. Referral was sent to the following facilities;    Destination Service Provider Address Phone Fax  [address] [place] [address] [place]  [facility]  [address] [place] [address] [phone] [facility]  [place], [place] [address] [phone]  [facility]  [address] [place] [address] [place]  [facility]  [address], [place] [address] [phone]  [facility] [address] [place] [address] [phone]  [facility]  [address], [place] [address] [phone]  [facility]  [address] [place] [address] [phone] [facility]  [address] [place] [address] [phone]  [facility]  [address] [place] [address] [phone] [facility]  [place], [place] [address] [phone]  [facility]  [address] [place]  [address] [phone]   Situation ongoing,  CSW will follow up.   Benjaman Kindler, MSW, LCSWA 11/08/2021  @ 2:22 AM

## 2021-12-11 ENCOUNTER — Ambulatory Visit (INDEPENDENT_AMBULATORY_CARE_PROVIDER_SITE_OTHER): Payer: BC Managed Care – PPO

## 2021-12-11 ENCOUNTER — Ambulatory Visit
Admission: EM | Admit: 2021-12-11 | Discharge: 2021-12-11 | Disposition: A | Discharge status: Home / Self Care | Payer: BC Managed Care – PPO | Attending: Family Medicine | Admitting: Family Medicine

## 2021-12-11 DIAGNOSIS — M25561 Pain in right knee: Secondary | ICD-10-CM | POA: Diagnosis not present

## 2021-12-11 DIAGNOSIS — S8991XA Unspecified injury of right lower leg, initial encounter: Secondary | ICD-10-CM

## 2021-12-11 NOTE — ED Provider Notes (Signed)
[facility]    CSN: [id number] Arrival date & time: 12/11/21  1922      History   Chief Complaint Chief Complaint  Patient presents with   Knee Injury    HPI Laura Thompson is a 46 y.o. female.   HPI 46 year old female presents with right pain for 1 month.  Patient reports right knee injury that occurred 30 days ago while working out.  She reports pain and popping in right knee since and has been taking OTC Ibuprofen and Robaxin without significant relief.  PMH significant for bipolar disorder and [id number] without complication.    Past Medical History:  Diagnosis Date   Bipolar 1 disorder ([place])    Diabetes mellitus without complication [facility])     Patient Active Problem List   Diagnosis Date Noted   Diabetes mellitus without complication ([place]) [id number]   Bipolar disorder, current episode manic severe with psychotic features ([place]) 03/10/2017   Bipolar affective disorder, current episode manic ([place]) 03/10/2017    Past Surgical History:  Procedure Laterality Date   CERVICAL ABLATION     DENTAL SURGERY     lipoma removal      OB History   No obstetric history on file.      Home Medications    Prior to Admission medications   Medication Sig Start Date End Date Taking? Authorizing Provider  methocarbamol (ROBAXIN) 500 MG tablet Take 500 mg by mouth 4 (four) times daily.   Yes [provider]  TRULICITY [id number] SOPN Inject 0.75 mg into the skin every Saturday.   Yes [provider]  Ascorbic Acid (VITAMIN C PO) Take 1 tablet by mouth daily.    [provider]  Cholecalciferol (VITAMIN D3 PO) Take 1 capsule by mouth daily.    [provider]  cyclobenzaprine (FLEXERIL) 10 MG tablet Take one tab PO TID PRN muscle spasm Patient not taking: Reported on 11/07/2021 05/23/19   Kandra Nicolas, MD  fluticasone [facility]) 50 MCG/ACT nasal spray Place 1-2 sprays into both nostrils daily.    [provider]  Ibuprofen  200 MG CAPS Take 200-400 mg by mouth every 6 (six) hours as needed (for mild pain or headaches).    [provider]  ODORLESS GARLIC PO Take 1 capsule by mouth daily.    [provider]  Omega-3 Fatty Acids (FISH OIL PO) Take 1 capsule by mouth daily.    [provider]  rosuvastatin (CRESTOR) 10 MG tablet Take 10 mg by mouth daily.    [provider]  VITAMIN A PO Take 1 tablet by mouth daily.    [provider]  zinc gluconate 50 MG tablet Take 50 mg by mouth daily.    [provider]  zolpidem (AMBIEN) 10 MG tablet Take 10 mg by mouth at bedtime as needed for sleep.    [provider]    Family History Family History  Problem Relation Age of Onset   Thyroid disease Mother    Hypertension Father    Diabetes Father    Glaucoma Father     Social History Social History   Tobacco Use   Smoking status: Never   Smokeless tobacco: Never  Vaping Use   Vaping Use: Never used  Substance Use Topics   Alcohol use: No   Drug use: No     Allergies   Aripiprazole, Diphenhydramine, Doxycycline, Iodinated contrast media, Sulfa antibiotics, Septra [sulfamethoxazole-trimethoprim], and Metformin and related   Review of Systems Review  of Systems  Musculoskeletal:        Right knee injury x days     Physical Exam Triage Vital Signs ED Triage Vitals  Enc Vitals Group     BP      Pulse      Resp      Temp      Temp src      SpO2      Weight      Height      Head Circumference      Peak Flow      Pain Score      Pain Loc      Pain Edu?      Excl. in [place]?    No data found.  Updated Vital Signs BP 120/86 (BP Location: Left Arm)   Pulse 80   Temp 98.6 F (37 C) (Oral)   Resp 18   Ht 5\' 3"  (1.6 m)   Wt 160 lb (72.6 kg)   LMP 11/17/2021 (Approximate)   SpO2 99%   BMI 28.34 kg/m    Physical Exam Vitals and nursing note reviewed.  Constitutional:      General: She is not in acute distress.    Appearance:  Normal appearance. She is normal weight. She is not ill-appearing.  HENT:     Head: Normocephalic and atraumatic.     Mouth/Throat:     Mouth: Mucous membranes are moist.     Pharynx: Oropharynx is clear.  Eyes:     Extraocular Movements: Extraocular movements intact.     Conjunctiva/sclera: Conjunctivae normal.     Pupils: Pupils are equal, round, and reactive to light.  Cardiovascular:     Rate and Rhythm: Normal rate and regular rhythm.     Pulses: Normal pulses.     Heart sounds: Normal heart sounds.  Pulmonary:     Effort: Pulmonary effort is normal.     Breath sounds: Normal breath sounds. No wheezing, rhonchi or rales.  Musculoskeletal:        General: Normal range of motion.     Cervical back: Normal range of motion and neck supple.  Skin:    General: Skin is warm and dry.  Neurological:     General: No focal deficit present.     Mental Status: She is alert and oriented to person, place, and time.      UC Treatments / Results  Labs (all labs ordered are listed, but only abnormal results are displayed) Labs Reviewed - No data to display  EKG   Radiology DG Knee Complete 4 Views Right  Result Date: 12/11/2021 CLINICAL DATA:  Knee injury EXAM: RIGHT KNEE - COMPLETE 4+ VIEW COMPARISON:  None Available. FINDINGS: Negative for fracture or joint effusion Joint space narrowing and spurring medially. Mild patellar spurring. No bone lesion. IMPRESSION: Mild degenerative change. Negative for fracture. Electronically Signed   By: Franchot Gallo M.D.   On: 12/11/2021 19:50    Procedures Procedures (including critical care time)  Medications Ordered in UC Medications - No data to display  Initial Impression / Assessment and Plan / UC Course  I have reviewed the triage vital signs and the nursing notes.  Pertinent labs & imaging results that were available during my care of the patient were reviewed by me and considered in my medical decision making (see chart for  details).     MDM: 1.  Right knee injury, initial encounter-x-ray of right knee revealed above. Advised patient of right knee  x-ray results with hard copy provided to patient.  Advised patient may RICE right knee for 30 minutes 3 times daily for the next 3 days.  Advised may use OTC Ibuprofen sparingly, daily or as needed for right knee pain advised patient if symptoms worsen and/or unresolved please follow-up with [facility] orthopedic provider for further evaluation.  Contact information is below.  Patient discharged home, hemodynamically stable. Final Clinical Impressions(s) / UC Diagnoses   Final diagnoses:  Right knee injury, initial encounter     Discharge Instructions      Advised patient of right knee x-ray results with hard copy provided to patient.  Advised patient may RICE right knee for 30 minutes 3 times daily for the next 3 days.  Advised may use OTC Ibuprofen sparingly, daily or as needed for right knee pain advised patient if symptoms worsen and/or unresolved please follow-up with [facility] orthopedic provider for further evaluation.  Contact information is below.     ED Prescriptions   None    PDMP not reviewed this encounter.   Eliezer Lofts, [place] 12/11/21 2001

## 2021-12-11 NOTE — ED Triage Notes (Signed)
Pt presents to Urgent Care with c/o R knee pain following an injury 30 days ago. Reports jumping while working out and has had pain and "popping" in this knee since then--taking ibuprofen and robaxin without significant relief.

## 2021-12-11 NOTE — ED Notes (Signed)
Error in charting--triage has not been completed.

## 2021-12-11 NOTE — Discharge Instructions (Addendum)
Advised patient of right knee x-ray results with hard copy provided to patient.  Advised patient may RICE right knee for 30 minutes 3 times daily for the next 3 days.  Advised may use OTC Ibuprofen sparingly, daily or as needed for right knee pain advised patient if symptoms worsen and/or unresolved please follow-up with [facility] orthopedic provider for further evaluation.  Contact information is below.

## 2022-02-19 ENCOUNTER — Other Ambulatory Visit: Payer: Self-pay

## 2022-02-19 ENCOUNTER — Emergency Department (HOSPITAL_COMMUNITY)
Admission: EM | Admit: 2022-02-19 | Discharge: 2022-02-21 | Disposition: A | Discharge status: Other Acute Inpatient Hospital | Payer: BC Managed Care – PPO | Attending: Emergency Medicine | Admitting: Emergency Medicine

## 2022-02-19 ENCOUNTER — Encounter (HOSPITAL_COMMUNITY): Payer: Self-pay

## 2022-02-19 DIAGNOSIS — F3110 Bipolar disorder, current episode manic without psychotic features, unspecified: Secondary | ICD-10-CM | POA: Diagnosis present

## 2022-02-19 DIAGNOSIS — E119 Type 2 diabetes mellitus without complications: Secondary | ICD-10-CM | POA: Diagnosis not present

## 2022-02-19 DIAGNOSIS — F309 Manic episode, unspecified: Secondary | ICD-10-CM | POA: Diagnosis not present

## 2022-02-19 DIAGNOSIS — Z1152 Encounter for screening for COVID-19: Secondary | ICD-10-CM | POA: Diagnosis not present

## 2022-02-19 LAB — CBC
HCT: 38.3 % (ref 36.0–46.0)
Hemoglobin: 12.3 g/dL (ref 12.0–15.0)
MCH: 27.4 pg (ref 26.0–34.0)
MCHC: 32.1 g/dL (ref 30.0–36.0)
MCV: 85.3 fL (ref 80.0–100.0)
Platelets: 344 10*3/uL (ref 150–400)
RBC: 4.49 MIL/uL (ref 3.87–5.11)
RDW: 13.1 % (ref 11.5–15.5)
WBC: 6.9 10*3/uL (ref 4.0–10.5)
nRBC: 0 % (ref 0.0–0.2)

## 2022-02-19 LAB — I-STAT BETA HCG BLOOD, ED (MC, WL, AP ONLY): I-stat hCG, quantitative: <5 m[IU]/mL (ref <5)

## 2022-02-19 LAB — BASIC METABOLIC PANEL
Anion gap: 7 (ref 5–15)
BUN: 16 mg/dL (ref 6–20)
CO2: 26 mmol/L (ref 22–32)
Calcium: 9.1 mg/dL (ref 8.9–10.3)
Chloride: 101 mmol/L (ref 98–111)
Creatinine, Ser: 0.71 mg/dL (ref 0.44–1.00)
GFR, Estimated: >60 mL/min (ref >60)
Glucose, Bld: 271 mg/dL — ABNORMAL HIGH (ref 70–99)
Potassium: 3.8 mmol/L (ref 3.5–5.1)
Sodium: 134 mmol/L — ABNORMAL LOW (ref 135–145)

## 2022-02-19 LAB — ACETAMINOPHEN LEVEL: Acetaminophen (Tylenol), Serum: <10 ug/mL — ABNORMAL LOW (ref 10–30)

## 2022-02-19 LAB — SALICYLATE LEVEL: Salicylate Lvl: <7 mg/dL — ABNORMAL LOW (ref 7.0–30.0)

## 2022-02-19 NOTE — ED Provider Triage Note (Addendum)
Emergency Medicine Provider Triage Evaluation Note  Laura Thompson , a 47 y.o. female  was evaluated in triage.  Pt here with husband who states patient is having manic episode.  Patient has history of the same.  Per patient husband, the patient has been showing erratic behavior recently.  The patient recently rented a rental car, a Teacher, English as a foreign language, and was noted to be driving very erratically through the city.  Patient husband states that the patient daughter called him at 1 point stating that the patient was out-of-control, that he needed to do something to help her.  The patient husband states that the patient has been acting very sexually aggressive with people and known to her.  Patient was seen recently for the same complaint, left ED before IVC paperwork be completed.  The patient is a flight risk and she requires evaluation.  IVC paperwork has been filed at this time.  Patient on exam denies any issue.  The patient does not believe she is having manic episode. Patient alert and oriented x 3.  Review of Systems  Positive:  Negative:   Physical Exam  BP (!) 114/90 (BP Location: Left Arm)   Pulse 74   Temp 98.2 F (36.8 C) (Oral)   Resp 18   SpO2 100%  Gen:   Awake, no distress   Resp:  Normal effort  MSK:   Moves extremities without difficulty  Other:    Medical Decision Making  Medically screening exam initiated at 8:59 PM.  Appropriate orders placed.  Laura Thompson was informed that the remainder of the evaluation will be completed by another provider, this initial triage assessment does not replace that evaluation, and the importance of remaining in the ED until their evaluation is complete.        Azucena Cecil, PA-C 02/19/22 2102

## 2022-02-19 NOTE — ED Triage Notes (Addendum)
Pt's husband states that over the past month she has not been herself. He states that she has been "erotic" and acting sexual over the past month.

## 2022-02-19 NOTE — BH Assessment (Signed)
Pt to be assessed once placed in a private room, EDP note is completed and pt is medically cleared. Please fax IVC paperwork to [phone].    Clinician to check back.    Vertell Novak, [place], [facility], [facility] Triage Specialist [phone]

## 2022-02-19 NOTE — ED Notes (Addendum)
Pt. In burgundy scrubs and wanded by security. Pt. Husband took patients belongings home.

## 2022-02-20 DIAGNOSIS — F3110 Bipolar disorder, current episode manic without psychotic features, unspecified: Secondary | ICD-10-CM | POA: Diagnosis not present

## 2022-02-20 LAB — RESP PANEL BY RT-PCR (RSV, FLU A&B, COVID)  RVPGX2
Influenza A by PCR: NEGATIVE
Influenza B by PCR: NEGATIVE
Resp Syncytial Virus by PCR: NEGATIVE
SARS Coronavirus 2 by RT PCR: NEGATIVE

## 2022-02-20 LAB — RAPID URINE DRUG SCREEN, HOSP PERFORMED
Amphetamines: NOT DETECTED
Barbiturates: NOT DETECTED
Benzodiazepines: NOT DETECTED
Cocaine: NOT DETECTED
Opiates: NOT DETECTED
Tetrahydrocannabinol: NOT DETECTED

## 2022-02-20 LAB — SARS CORONAVIRUS 2 BY RT PCR: SARS Coronavirus 2 by RT PCR: NEGATIVE

## 2022-02-20 LAB — URINALYSIS, ROUTINE W REFLEX MICROSCOPIC
Bacteria, UA: NONE SEEN
Bilirubin Urine: NEGATIVE
Glucose, UA: >=500 mg/dL — AB
Ketones, ur: NEGATIVE mg/dL
Leukocytes,Ua: NEGATIVE
Nitrite: NEGATIVE
Protein, ur: NEGATIVE mg/dL
Specific Gravity, Urine: 1.032 — ABNORMAL HIGH (ref 1.005–1.030)
pH: 5 (ref 5.0–8.0)

## 2022-02-20 LAB — HEPATIC FUNCTION PANEL
ALT: 18 U/L (ref 0–44)
AST: 16 U/L (ref 15–41)
Albumin: 3.7 g/dL (ref 3.5–5.0)
Alkaline Phosphatase: 50 U/L (ref 38–126)
Bilirubin, Direct: <0.1 mg/dL (ref 0.0–0.2)
Total Bilirubin: 0.5 mg/dL (ref 0.3–1.2)
Total Protein: 7.1 g/dL (ref 6.5–8.1)

## 2022-02-20 MED ORDER — ROSUVASTATIN CALCIUM 20 MG PO TABS
10.0000 mg | ORAL_TABLET | Freq: Every day | ORAL | Status: DC
  Administered 2022-02-20: 10 mg via ORAL
  Filled 2022-02-20: qty 1

## 2022-02-20 MED ORDER — DULAGLUTIDE 0.75 MG/0.5ML ~~LOC~~ SOAJ: 0.7500 mg | SUBCUTANEOUS | Status: DC

## 2022-02-20 MED ORDER — ZOLPIDEM TARTRATE 5 MG PO TABS: 10.0000 mg | ORAL_TABLET | Freq: Every evening | ORAL | Status: DC | PRN

## 2022-02-20 MED ORDER — ZOLPIDEM TARTRATE 5 MG PO TABS: 5.0000 mg | ORAL_TABLET | Freq: Every evening | ORAL | Status: DC | PRN

## 2022-02-20 NOTE — Progress Notes (Addendum)
Pt was accepted to [facility] 02/20/22 PENDING LFT's, and dose of antipsychotic given before transport ; Bed Assignment 401-1  Pt meets inpatient criteria per Michaele Offer, [place]  Attending Physician will be Dr. Caswell Corwin  Report can be called to: - Child and Adolescence unit: [phone] -Adult unit: [phone]  Pt can arrive after 1:30pm  Care Team notified: Michaele Offer, [place], [place], RN, Shiela Mayer, Corinna Gab, RN Night [place], [place] 02/20/2022 @ 1:41 PM

## 2022-02-20 NOTE — ED Provider Notes (Signed)
[facility] Emergency Department Provider Note MRN:  [id number]  Arrival date & time: 02/20/22     Chief Complaint   IVC   History of Present Illness   Laura Thompson is a 47 y.o. year-old female with a history of bipolar disorder, diabetes presenting to the ED with chief complaint of IVC.  IVC paperwork initiated by husband.  Patient reportedly is manic, acting erratically.  Driving the car dangerously throughout town, being hypersexual with strangers, being irresponsible with money.  Concern for her safety.  Review of Systems  A thorough review of systems was obtained and all systems are negative except as noted in the HPI and PMH.   Patient's Health History    Past Medical History:  Diagnosis Date   Bipolar 1 disorder ([place])    Diabetes mellitus without complication ([place])     Past Surgical History:  Procedure Laterality Date   CERVICAL ABLATION     DENTAL SURGERY     lipoma removal      Family History  Problem Relation Age of Onset   Thyroid disease Mother    Hypertension Father    Diabetes Father    Glaucoma Father     Social History   Socioeconomic History   Marital status: Married    Spouse name: Not on file   Number of children: Not on file   Years of education: Not on file   Highest education level: Not on file  Occupational History   Not on file  Tobacco Use   Smoking status: Never   Smokeless tobacco: Never  Vaping Use   Vaping Use: Never used  Substance and Sexual Activity   Alcohol use: No   Drug use: No   Sexual activity: Not on file  Other Topics Concern   Not on file  Social History Narrative   Not on file   Social Determinants of Health   Financial Resource Strain: Not on file  Food Insecurity: Not on file  Transportation Needs: Not on file  Physical Activity: Not on file  Stress: Not on file  Social Connections: Not on file  Intimate Partner Violence: Not on file     Physical Exam   Vitals:    02/19/22 1932 02/19/22 2002  BP: (!) 125/97 (!) 114/90  Pulse: 82 74  Resp: 17 18  Temp: 98.1 F (36.7 C) 98.2 F (36.8 C)  SpO2: 100% 100%    CONSTITUTIONAL: Well-appearing, NAD NEURO/PSYCH:  Alert and oriented x 3, no focal deficits EYES:  eyes equal and reactive ENT/NECK:  no LAD, no JVD CARDIO: Regular rate, well-perfused, normal S1 and S2 PULM:  CTAB no wheezing or rhonchi GI/GU:  non-distended, non-tender MSK/SPINE:  No gross deformities, no edema SKIN:  no rash, atraumatic   *Additional and/or pertinent findings included in MDM below  Diagnostic and Interventional Summary    EKG Interpretation  Date/Time:  Wednesday February 19 2022 23:16:33 EST Ventricular Rate:  67 PR Interval:  149 QRS Duration: 89 QT Interval:  388 QTC Calculation: 410 R Axis:   -11 Text Interpretation: Sinus rhythm RSR' in V1 or V2, right VCD or RVH Confirmed by Gerlene Fee [phone]) on 02/20/2022 3:31:09 AM       Labs Reviewed  BASIC METABOLIC PANEL - Abnormal; Notable for the following components:      Result Value   Sodium 134 (*)    Glucose, Bld 271 (*)    All other components within normal limits  URINALYSIS, ROUTINE W REFLEX  MICROSCOPIC - Abnormal; Notable for the following components:   Specific Gravity, Urine 1.032 (*)    Glucose, UA >=500 (*)    Hgb urine dipstick SMALL (*)    All other components within normal limits  SALICYLATE LEVEL - Abnormal; Notable for the following components:   Salicylate Lvl <3.3 (*)    All other components within normal limits  ACETAMINOPHEN LEVEL - Abnormal; Notable for the following components:   Acetaminophen (Tylenol), Serum <10 (*)    All other components within normal limits  SARS CORONAVIRUS 2 BY RT PCR  CBC  RAPID URINE DRUG SCREEN, HOSP PERFORMED  I-STAT BETA HCG BLOOD, ED ([facility], [facility], [facility] ONLY)    No orders to display    Medications - No data to display   Procedures  /  Critical Care Procedures  ED Course and Medical Decision Making   Initial Impression and Ddx Patient seems to be in a very good mood, no acute distress, pleasant to talk to.  Manic episode seems plausible at this point given patient's history.  Will have TTS evaluate.  Past medical/surgical history that increases complexity of ED encounter: Bipolar disorder  Interpretation of Diagnostics I personally reviewed the laboratory assessment and my interpretation is as follows: No significant blood count or electrolyte disturbance    Patient Reassessment and Ultimate Disposition/Management     Patient is now medically cleared awaiting TTS recommendations.  Patient management required discussion with the following services or consulting groups:  Psychiatry/TTS  Complexity of Problems Addressed Acute illness or injury that poses threat of life of bodily function  Additional Data Reviewed and Analyzed Further history obtained from: None  Additional Factors Impacting ED Encounter Risk Consideration of hospitalization  Barth Kirks. [place] Small, [place] [email or web address]  Final Clinical Impressions(s) / ED Diagnoses     ICD-10-CM   1. Manic episode ([place])  F30.9       ED Discharge Orders     None        Discharge Instructions Discussed with and Provided to Patient:   Discharge Instructions   None      Maudie Flakes, MD 02/20/22 [phone]

## 2022-02-20 NOTE — ED Notes (Signed)
Call to husband and daughter in an attempt to get contact information from her cell phone to contact her employer and notify them she will not be in for worker.

## 2022-02-20 NOTE — Progress Notes (Signed)
UPDATE ON ADMISSION TO [facility]-Pt will admit to [facility] [place] Pending discharges on 500 hall  Care Team notified: Michaele Offer, [place], [place], RN, Shiela Mayer, Corinna Gab, RN Night [place]. Emylee Decelle, MSW, [facility] 02/20/2022 2:36 PM

## 2022-02-20 NOTE — ED Notes (Signed)
OOB doing squatting exercises at her bedside. 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 Repeats several that " I amy a school do special ED". Laura Thompson then asks "why am I here and can I leave" she was informed that she would be re-evaluated today and some decision would be made on her plan of care. She was given the list of unit guidelines and she returned to her room

## 2022-02-20 NOTE — BH Assessment (Signed)
Clinician checked back, pt to be assessed once EDP note is complete, medically cleared and  pt is placed in a private room. Please fax pt's IVC paperwork to [phone].    Vertell Novak, [place], [facility], [facility] Triage Specialist [phone]

## 2022-02-20 NOTE — ED Notes (Addendum)
Mrs. Laura Thompson returns to desk asking for pencil and paper so she could write down the contact information for her job so she could pass it on to her husband so he call her out for the day. 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      She then begins a tirate the she and her husband are finished and their relationship was over. Again her speech was very pressured , rapid fire, bordering anger that turns to laughter. She then request to take a shower and asked for hygiene supplies then she returns to her room and was observed by staff sitting in a chair inside her room rocking while steering off into space. Hygiene supplies we place at the nurse station but Laura Thompson returns to her room turns off the light and in the dark is observed rocking her self to asleep.

## 2022-02-20 NOTE — Inpatient Diabetes Management (Signed)
Inpatient Diabetes Program Recommendations  AACE/ADA: New Consensus Statement on Inpatient Glycemic Control (2015)  Target Ranges:  Prepandial:   less than 140 mg/dL      Peak postprandial:   less than 180 mg/dL (1-2 hours)      Critically ill patients:  140 - 180 mg/dL   Lab Results  Component Value Date   GLUCAP 205 (H) 11/08/2021   HGBA1C 7.8 (H) 03/12/2017    Review of Glycemic Control  Latest Reference Range & Units 02/19/22 21:10  Glucose 70 - 99 mg/dL 271 (H)  (H): Data is abnormally high  Diabetes history: DM2 Outpatient Diabetes medications: Trulicity 2.54 mg every Saturday Current orders for Inpatient glycemic control: Trulicity 2.70 every Saturday (not on formulary)  Inpatient Diabetes Program Recommendations:    Please consider:  Novolog 0-9 units TID and 0-5 units QHS  Will continue to follow while inpatient.  Thank you, Reche Dixon, MSN, [place] Diabetes Coordinator Inpatient Diabetes Program [phone] (team pager from 8a-5p)
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
## 2022-02-21 ENCOUNTER — Other Ambulatory Visit: Payer: Self-pay

## 2022-02-21 ENCOUNTER — Inpatient Hospital Stay (HOSPITAL_COMMUNITY)
Admission: AD | Admit: 2022-02-21 | Discharge: 2022-02-28 | DRG: 885 | Disposition: A | Discharge status: Home / Self Care | Payer: Federal, State, Local not specified - Other | Source: Intra-hospital | Attending: Psychiatry | Admitting: Psychiatry

## 2022-02-21 ENCOUNTER — Encounter (HOSPITAL_COMMUNITY): Payer: Self-pay | Admitting: Psychiatry

## 2022-02-21 DIAGNOSIS — G4700 Insomnia, unspecified: Secondary | ICD-10-CM | POA: Diagnosis present

## 2022-02-21 DIAGNOSIS — F419 Anxiety disorder, unspecified: Secondary | ICD-10-CM | POA: Diagnosis present

## 2022-02-21 DIAGNOSIS — F309 Manic episode, unspecified: Secondary | ICD-10-CM | POA: Diagnosis not present

## 2022-02-21 DIAGNOSIS — E785 Hyperlipidemia, unspecified: Secondary | ICD-10-CM | POA: Diagnosis present

## 2022-02-21 DIAGNOSIS — Z7984 Long term (current) use of oral hypoglycemic drugs: Secondary | ICD-10-CM

## 2022-02-21 DIAGNOSIS — Z91199 Patient's noncompliance with other medical treatment and regimen due to unspecified reason: Secondary | ICD-10-CM

## 2022-02-21 DIAGNOSIS — F3112 Bipolar disorder, current episode manic without psychotic features, moderate: Principal | ICD-10-CM | POA: Diagnosis present

## 2022-02-21 DIAGNOSIS — E119 Type 2 diabetes mellitus without complications: Secondary | ICD-10-CM | POA: Diagnosis present

## 2022-02-21 DIAGNOSIS — Z79899 Other long term (current) drug therapy: Secondary | ICD-10-CM | POA: Diagnosis not present

## 2022-02-21 DIAGNOSIS — F3174 Bipolar disorder, in full remission, most recent episode manic: Principal | ICD-10-CM | POA: Diagnosis present

## 2022-02-21 DIAGNOSIS — Z1152 Encounter for screening for COVID-19: Secondary | ICD-10-CM | POA: Diagnosis not present

## 2022-02-21 MED ORDER — DULAGLUTIDE 0.75 MG/0.5ML ~~LOC~~ SOAJ: 0.7500 mg | SUBCUTANEOUS | Status: DC

## 2022-02-21 MED ORDER — OLANZAPINE 5 MG PO TBDP: 5.0000 mg | ORAL_TABLET | Freq: Three times a day (TID) | ORAL | Status: DC | PRN

## 2022-02-21 MED ORDER — ZIPRASIDONE MESYLATE 20 MG IM SOLR: 20.0000 mg | INTRAMUSCULAR | Status: DC | PRN

## 2022-02-21 MED ORDER — LORAZEPAM 1 MG PO TABS
1.0000 mg | ORAL_TABLET | Freq: Once | ORAL | Status: DC | PRN
  Filled 2022-02-21: qty 1

## 2022-02-21 MED ORDER — HALOPERIDOL 5 MG PO TABS
5.0000 mg | ORAL_TABLET | Freq: Once | ORAL | Status: DC | PRN
  Filled 2022-02-21: qty 1

## 2022-02-21 MED ORDER — ZOLPIDEM TARTRATE 5 MG PO TABS: 5.0000 mg | ORAL_TABLET | Freq: Every evening | ORAL | Status: DC | PRN

## 2022-02-21 MED ORDER — ALUM & MAG HYDROXIDE-SIMETH 200-200-20 MG/5ML PO SUSP: 30.0000 mL | ORAL | Status: DC | PRN

## 2022-02-21 MED ORDER — ROSUVASTATIN CALCIUM 5 MG PO TABS
10.0000 mg | ORAL_TABLET | Freq: Every day | ORAL | Status: DC
  Administered 2022-02-22 – 2022-02-28 (×7): 10 mg via ORAL
  Filled 2022-02-21 (×5): qty 1
  Filled 2022-02-21: qty 14
  Filled 2022-02-21 (×4): qty 1

## 2022-02-21 MED ORDER — LORAZEPAM 1 MG PO TABS: 1.0000 mg | ORAL_TABLET | ORAL | Status: DC | PRN

## 2022-02-21 MED ORDER — MAGNESIUM HYDROXIDE 400 MG/5ML PO SUSP: 30.0000 mL | Freq: Every day | ORAL | Status: DC | PRN

## 2022-02-21 MED ORDER — LORAZEPAM 2 MG/ML PO CONC
1.0000 mg | Freq: Once | ORAL | Status: DC | PRN
  Filled 2022-02-21: qty 0.5

## 2022-02-21 MED ORDER — HALOPERIDOL LACTATE 5 MG/ML IJ SOLN: 5.0000 mg | Freq: Once | INTRAMUSCULAR | Status: DC | PRN

## 2022-02-21 MED ORDER — OMEGA-3-ACID ETHYL ESTERS 1 G PO CAPS
1.0000 g | ORAL_CAPSULE | Freq: Every day | ORAL | Status: DC
  Administered 2022-02-21 – 2022-02-28 (×8): 1 g via ORAL
  Filled 2022-02-21 (×5): qty 1
  Filled 2022-02-21: qty 7
  Filled 2022-02-21 (×5): qty 1

## 2022-02-21 MED ORDER — ACETAMINOPHEN 325 MG PO TABS
650.0000 mg | ORAL_TABLET | Freq: Four times a day (QID) | ORAL | Status: DC | PRN
  Administered 2022-02-23 – 2022-02-28 (×3): 650 mg via ORAL
  Filled 2022-02-21 (×3): qty 2

## 2022-02-21 MED ORDER — HYDROXYZINE HCL 25 MG PO TABS
25.0000 mg | ORAL_TABLET | Freq: Three times a day (TID) | ORAL | Status: DC | PRN
  Filled 2022-02-21: qty 10

## 2022-02-21 MED ORDER — TRAZODONE HCL 50 MG PO TABS
50.0000 mg | ORAL_TABLET | Freq: Every evening | ORAL | Status: DC | PRN
  Administered 2022-02-24: 50 mg via ORAL
  Filled 2022-02-21 (×2): qty 1
  Filled 2022-02-21: qty 7
  Filled 2022-02-21: qty 1

## 2022-02-21 NOTE — Progress Notes (Signed)
   02/21/22 2100  Psych Admission Type (Psych Patients Only)  Admission Status Involuntary  Psychosocial Assessment  Patient Complaints None  Eye Contact Brief  Facial Expression Anxious  Affect Anxious  Speech Pressured  Interaction Assertive  Motor Activity Restless  Appearance/Hygiene In scrubs  Behavior Characteristics Cooperative;Appropriate to situation;Anxious  Mood Anxious;Pleasant  Aggressive Behavior  Effect No apparent injury  Thought Process  Coherency WDL  Content WDL  Delusions None reported or observed  Perception WDL  Hallucination None reported or observed  Judgment Poor  Confusion None  Danger to Self  Current suicidal ideation? Denies

## 2022-02-21 NOTE — Progress Notes (Signed)
The focus of this group is to help patients review their daily goal of treatment and discuss progress on daily workbooks. Pt did not attend the evening group. 

## 2022-02-21 NOTE — ED Provider Notes (Signed)
Emergency Medicine Observation Re-evaluation Note  Laura Thompson is a 47 y.o. female, seen on rounds today.  Pt initially presented to the ED for complaints of IVC Currently, the patient is resting after just taking a shower.  Physical Exam  BP 113/87 (BP Location: Left Arm)   Pulse 82   Temp 97.8 F (36.6 C) (Oral)   Resp 16   Ht 5\' 3"  (1.6 m)   Wt 72.6 kg   SpO2 100%   BMI 28.35 kg/m  Physical Exam General: Resting without distress Cardiac: No murmur on my exam Lungs: Clear bilaterally on auscultation Psych: Acute agitation  ED Course / MDM  EKG:EKG Interpretation  Date/Time:  Wednesday February 19 2022 23:16:33 EST Ventricular Rate:  67 PR Interval:  149 QRS Duration: 89 QT Interval:  388 QTC Calculation: 410 R Axis:   -11 Text Interpretation: Sinus rhythm RSR' in V1 or V2, right VCD or RVH Confirmed by Gerlene Fee [phone]) on 02/20/2022 3:31:09 AM  I have reviewed the labs performed to date as well as medications administered while in observation.  Recent changes in the last 24 hours include reported.  Plan  Current plan is for admission this morning to [facility].  Patient was requesting discussion with psychiatry and this was passed along to nursing to see if they could set that up before she gets admitted.Sherry Ruffing, Gwenyth Allegra, MD 02/21/22 [phone]
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
## 2022-02-21 NOTE — ED Notes (Signed)
Patient off unit to [facility] per provider. Patient alert, cooperative and no s/s of distress at this time. Patient discharge information and belongings given to [employer] for transport. Patient ambulatory off unit, escorted by and transported by [employer].
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
## 2022-02-21 NOTE — Progress Notes (Signed)
Pt was accepted to [facility] 02/21/22; BED Assignment 508-02  Pt meets inpatient criteria per Donneta Romberg Motley-Mangru,PMHNP  Attending Physician will be Dr. Janine Limbo  Report can be called to: Adult unit: [phone]  Pt can arrive after 2pm  Care Team notified: [place], RN, Lars Pinks, RN, Luanna Salk, RN, [place] Motley-Mangru,PMHNP, Wilmon Arms, RN, Herma Ard, RN, Duwayne Heck, RN  [address], LCSWA 02/21/2022 @ 5:39 PM

## 2022-02-21 NOTE — Progress Notes (Signed)
Adult Psychoeducational Group Note  Date:  02/21/2022 Time:  7:01 PM  Group Topic/Focus:  Goals Group:   The focus of this group is to help patients establish daily goals to achieve during treatment and discuss how the patient can incorporate goal setting into their daily lives to aide in recovery.  Participation Level:  Did Not Attend  Participation Quality:   n/a  Affect:   n/a  Cognitive:   n/a  Insight: None  Engagement in Group:   n/a  Modes of Intervention:   n/a  Additional Comments:   Pt did not attend the morning goals group.  Wetzel Bjornstad Izea Livolsi 02/21/2022, 7:01 PM

## 2022-02-21 NOTE — BHH Group Notes (Signed)
Spirituality group facilitated by Chaplain Katy Raynee Mccasland, BCC.   Group Description: Group focused on topic of hope. Patients participated in facilitated discussion around topic, connecting with one another around experiences and definitions for hope. Group members engaged with visual explorer photos, reflecting on what hope looks like for them today. Group engaged in discussion around how their definitions of hope are present today in hospital.   Modalities: Psycho-social ed, Adlerian, Narrative, MI   Patient Progress: Did not attend.  

## 2022-02-22 LAB — LIPID PANEL
Cholesterol: 224 mg/dL — ABNORMAL HIGH (ref 0–200)
HDL: 83 mg/dL (ref >40)
LDL Cholesterol: 137 mg/dL — ABNORMAL HIGH (ref 0–99)
Total CHOL/HDL Ratio: 2.7 RATIO
Triglycerides: 22 mg/dL (ref <150)
VLDL: 4 mg/dL (ref 0–40)

## 2022-02-22 LAB — TSH: TSH: 3.074 u[IU]/mL (ref 0.350–4.500)

## 2022-02-22 MED ORDER — QUETIAPINE FUMARATE 50 MG PO TABS
50.0000 mg | ORAL_TABLET | Freq: Every day | ORAL | Status: DC
  Filled 2022-02-22 (×2): qty 1

## 2022-02-22 MED ORDER — LAMOTRIGINE 25 MG PO TABS
25.0000 mg | ORAL_TABLET | Freq: Every day | ORAL | Status: DC
  Administered 2022-02-22 – 2022-02-25 (×4): 25 mg via ORAL
  Filled 2022-02-22 (×7): qty 1

## 2022-02-22 MED ORDER — QUETIAPINE FUMARATE 100 MG PO TABS: 100.0000 mg | ORAL_TABLET | Freq: Every day | ORAL | Status: DC

## 2022-02-22 NOTE — Group Note (Signed)
[facility] LCSW Group Therapy Note   Date/Time:  02/22/2022 11:15AM-12:00PM   Type of Therapy and Topic:  Group Therapy:  Feelings About Hospitalization   Participation Level:  Did Not Attend    Description of Group This process group involved patients discussing their feelings related to being hospitalized, as well as the benefits they see to being in the hospital.  These feelings and benefits were itemized.  The group then brainstormed specific ways in which they could seek those same benefits when they discharge and return home.   Therapeutic Goals Patient will identify and describe positive and negative feelings related to hospitalization Patient will verbalize benefits of hospitalization to themselves personally Patients will brainstorm together ways they can obtain similar benefits in the outpatient setting, identify barriers to wellness and possible solutions   Summary of Patient Progress:  N/A   Therapeutic Modalities Cognitive Behavioral Therapy Motivational Interviewing       Deshia Vanderhoof Grossman-Orr, LCSW 02/22/2022, 11:52 AM  

## 2022-02-22 NOTE — [facility] Group Notes (Signed)
Goals Group 02/22/22   Group Focus: affirmation, clarity of thought, and goals/reality orientation Treatment Modality:  Psychoeducation Interventions utilized were assignment, group exercise, and support Purpose: To be able to understand and verbalize the reason for their admission to the hospital. To understand that the medication helps with their chemical imbalance but they also need to work on their choices in life. To be challenged to develop a list of 30 positives about themselves. Also introduce the concept that "feelings" are not reality.  Participation Level:  Active  Participation Quality:  Appropriate  Affect:  Appropriate  Cognitive:  Appropriate  Insight:  Improving  Engagement in Group:  Engaged  Additional Comments:  .Marland KitchenMarland KitchenPt was in the group and rates her energy at a 10/10. Pt was in the middle of talking and was tearful, when she was removed from the group and did not come back  [place], Altha Harm A
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
## 2022-02-22 NOTE — [facility] Group Notes (Signed)
Pt attended wrap-up group. Pt rated day 7 out of 10. Pt states that she does not need to be here and the only thing she needs to work on is forgiveness.
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
## 2022-02-22 NOTE — [facility] Suicide Risk Assessment (Signed)
Suicide Risk Assessment  Admission Assessment    [facility] Admission Suicide Risk Assessment   Nursing information obtained from:  Patient Demographic factors:  Adolescent or young adult Current Mental Status:  NA Loss Factors:  Decrease in vocational status Historical Factors:  Impulsivity Risk Reduction Factors:  Employed, Positive social support, Sense of responsibility to family, Responsible for children under 49 years of age  Total Time spent with patient: 45 minutes Principal Problem: Bipolar 1 disorder, manic, moderate ([place]) Diagnosis:  Principal Problem:   Bipolar 1 disorder, manic, moderate (HCC)  Subjective Data: Laura Thompson is a 47 year old female with a psychiatric history of bipolar 1 disorder and insomnia who presented to [facility] involuntarily from [facility] for concerns for manic behavior.    On Chart Review: Per [facility] psychiatric provider on 11/05/21, has been without Zyprexa at least 1 year because made her feel "jittery." Restarted on Zyprexa 5 mg qHS and Suvorexant 5 mg qHS PRN insomnia. At the time, "Husband reports that she is not sleeping, she is spending money inappropriately, speeding, listening to music, laughing inappropriately, etc. She disputes inappropriateness of her behaviors and goes into detail to justify her actions and their appropriateness."  Continued Clinical Symptoms:  Alcohol Use Disorder Identification Test Final Score (AUDIT): 0 The "Alcohol Use Disorders Identification Test", Guidelines for Use in Primary Care, Second Edition.  [facility]). Score between 0-7:  no or low risk or alcohol related problems. Score between 8-15:  moderate risk of alcohol related problems. Score between 16-19:  high risk of alcohol related problems. Score 20 or above:  warrants further diagnostic evaluation for alcohol dependence and treatment.   CLINICAL FACTORS:   Bipolar Disorder:   Mixed State Unstable or Poor Therapeutic Relationship Previous  Psychiatric Diagnoses and Treatments   Musculoskeletal: Strength & Muscle Tone: within normal limits Gait & Station: normal Patient leans: N/A  Psychiatric Specialty Exam:  Presentation  General Appearance:  Appropriate for Environment; Casual  Eye Contact: Good  Speech: Clear and Coherent; Normal Rate (Hyperverbal)  Speech Volume: Increased  Handedness: Right   Mood and Affect  Mood: Euthymic; Irritable  Affect: Congruent   Thought Process  Thought Processes: Goal Directed  Descriptions of Associations:Tangential  Orientation:Full (Time, Place and Person)  Thought Content:Paranoid Ideation; Illogical  History of Schizophrenia/Schizoaffective disorder:No  Duration of Psychotic Symptoms:N/A  Hallucinations:Hallucinations: None  Ideas of Reference:Percusatory; Paranoia  Suicidal Thoughts:Suicidal Thoughts: No  Homicidal Thoughts:Homicidal Thoughts: No   Sensorium  Memory: Immediate Good; Recent Fair  Judgment: Impaired  Insight: Poor; Lacking   Executive Functions  Concentration: Fair  Attention Span: Fair  Recall: [place] of Knowledge: Good  Language: Good   Psychomotor Activity  Psychomotor Activity: Psychomotor Activity: Increased; Restlessness   Assets  Assets: Armed forces logistics/support/administrative officer; Financial Resources/Insurance; Housing; Intimacy; Social Support; Vocational/Educational; Transportation   Sleep  Sleep: Sleep: Poor    Physical Exam: Physical Exam Vitals reviewed.  Constitutional:      General: She is not in acute distress. HENT:     Head: Normocephalic and atraumatic.     Mouth/Throat:     Mouth: Mucous membranes are moist.     Pharynx: Oropharynx is clear.  Pulmonary:     Effort: Pulmonary effort is normal.  Skin:    General: Skin is warm and dry.  Neurological:     General: No focal deficit present.     Mental Status: She is alert and oriented to person, place, and time.     Motor: No weakness.  Gait: Gait normal.      Review of Systems  Constitutional:  Positive for weight loss.       Intentional  HENT:  Negative for congestion.   Cardiovascular:  Negative for chest pain.  Gastrointestinal: Negative.   Genitourinary: Negative.   Musculoskeletal:  Negative for myalgias.  Neurological:  Negative for dizziness, tremors, seizures, weakness and headaches.   Blood pressure (!) 127/103, pulse 99, temperature 98.4 F (36.9 C), temperature source Oral, resp. rate 16, height 5\' 3"  (1.6 m), weight 74.8 kg, SpO2 100 %. Body mass index is 29.23 kg/m.   COGNITIVE FEATURES THAT CONTRIBUTE TO RISK:  None    SUICIDE RISK:   Mild: There are no identifiable plans, no associated intent, mild dysphoria and related symptoms, good self-control (both objective and subjective assessment), few other risk factors, and identifiable protective factors, including available and accessible social support.  PLAN OF CARE:   See H&P for full plan of care  I certify that inpatient services furnished can reasonably be expected to improve the patient's condition.   , MD 02/22/2022, 2:25 PM

## 2022-02-22 NOTE — Progress Notes (Signed)
Pt awake, pacing in milieu at intervals. Observed with labile mood as evidenced by intermittent animation, laughter to agitation, pressured, tangential and rapid speech that's argumentative in nature in demand to d/c. Per pt "Y'all need to just let me go. You can't give me my Trulicity because you say the pharmacy don't have it. If I have to get it from my pharmacy then y'all can't keep me". Denies SI, HI, AVH and pain but is visibly restless, pacing, interacting inappropriately with another peer on [address] hall in reference to getting out of facility and coming to get him to be together. Took her medications with increased prompts related to being suspicious "I want to know what y'all giving me, what is that. My cholesterol is ok. I don't need to be taking medications just to take it". Pt refused to attend all scheduled groups despite multiple prompts. Support, encouragement and reassurance offered to pt. Safety checks maintained at Q 15 minutes intervals with intermittent verbal outbursts. Tolerated medications, meals and fluids well. Safety maintained on and off unit.

## 2022-02-22 NOTE — Progress Notes (Signed)
Adult Psychoeducational Group Note  Date:  02/22/2022 Time:  11:03 AM  Group Topic/Focus:  Goals Group:   The focus of this group is to help patients establish daily goals to achieve during treatment and discuss how the patient can incorporate goal setting into their daily lives to aide in recovery.  Participation Level:  Did Not Attend  Participation Quality:   n/a  Affect:   n/a  Cognitive:   n/a  Insight: None  Engagement in Group:   n/a  Modes of Intervention:   n/a  Additional Comments:   Pt did not attend the morning goals group.  Laura Thompson Hunter Pinkard 02/22/2022, 11:03 AM

## 2022-02-22 NOTE — Progress Notes (Signed)
   02/22/22 0537  15 Minute Checks  Location Bedroom  Visual Appearance Calm  Behavior Sleeping  Sleep (Behavioral Health Patients Only)  Calculate sleep? (Click Yes once per 24 hr at 0600 safety check) Yes  Documented sleep last 24 hours 5.75

## 2022-02-23 LAB — CBC WITH DIFFERENTIAL/PLATELET
Abs Immature Granulocytes: 0.03 10*3/uL (ref 0.00–0.07)
Basophils Absolute: 0 10*3/uL (ref 0.0–0.1)
Basophils Relative: 1 %
Eosinophils Absolute: 0.1 10*3/uL (ref 0.0–0.5)
Eosinophils Relative: 1 %
HCT: 41.3 % (ref 36.0–46.0)
Hemoglobin: 13.2 g/dL (ref 12.0–15.0)
Immature Granulocytes: 0 %
Lymphocytes Relative: 25 %
Lymphs Abs: 1.8 10*3/uL (ref 0.7–4.0)
MCH: 27.7 pg (ref 26.0–34.0)
MCHC: 32 g/dL (ref 30.0–36.0)
MCV: 86.8 fL (ref 80.0–100.0)
Monocytes Absolute: 0.7 10*3/uL (ref 0.1–1.0)
Monocytes Relative: 9 %
Neutro Abs: 4.6 10*3/uL (ref 1.7–7.7)
Neutrophils Relative %: 64 %
Platelets: 320 10*3/uL (ref 150–400)
RBC: 4.76 MIL/uL (ref 3.87–5.11)
RDW: 13.2 % (ref 11.5–15.5)
WBC: 7.2 10*3/uL (ref 4.0–10.5)
nRBC: 0 % (ref 0.0–0.2)

## 2022-02-23 LAB — BASIC METABOLIC PANEL
Anion gap: 11 (ref 5–15)
BUN: 13 mg/dL (ref 6–20)
CO2: 20 mmol/L — ABNORMAL LOW (ref 22–32)
Calcium: 9 mg/dL (ref 8.9–10.3)
Chloride: 100 mmol/L (ref 98–111)
Creatinine, Ser: 0.69 mg/dL (ref 0.44–1.00)
GFR, Estimated: >60 mL/min (ref >60)
Glucose, Bld: 298 mg/dL — ABNORMAL HIGH (ref 70–99)
Potassium: 4 mmol/L (ref 3.5–5.1)
Sodium: 131 mmol/L — ABNORMAL LOW (ref 135–145)

## 2022-02-23 MED ORDER — ARIPIPRAZOLE 5 MG PO TABS
5.0000 mg | ORAL_TABLET | Freq: Every day | ORAL | Status: DC
  Administered 2022-02-23 – 2022-02-25 (×3): 5 mg via ORAL
  Filled 2022-02-23 (×6): qty 1

## 2022-02-23 NOTE — Progress Notes (Signed)
   02/23/22 0600  15 Minute Checks  Location Bedroom  Visual Appearance Calm  Behavior Composed  Sleep (Behavioral Health Patients Only)  Calculate sleep? (Click Yes once per 24 hr at 0600 safety check) Yes  Documented sleep last 24 hours 6

## 2022-02-23 NOTE — Progress Notes (Signed)
Patient refused EKG. Stated "My heart is fine. Every time I'm sleeping somebody wake me up. If they don't let me leaving, I'm leaving anyway."

## 2022-02-23 NOTE — Progress Notes (Signed)
   02/23/22 2204  Psych Admission Type (Psych Patients Only)  Admission Status Involuntary  Psychosocial Assessment  Patient Complaints None  Eye Contact Brief  Facial Expression Anxious  Affect Anxious  Speech Pressured  Interaction Assertive  Motor Activity Restless  Appearance/Hygiene Unremarkable  Behavior Characteristics Cooperative;Appropriate to situation  Mood Pleasant;Anxious  Aggressive Behavior  Effect No apparent injury  Thought Process  Coherency WDL  Content WDL  Delusions None reported or observed  Perception WDL  Hallucination None reported or observed  Judgment Poor  Confusion None  Danger to Self  Current suicidal ideation? Denies

## 2022-02-23 NOTE — Progress Notes (Signed)
Patient has been up and active on the hall tonight. She attended group and participated. She refused her hs Seroquel reporting that she does not need it. Writer encouraged her to speak to the doctor on tomorrow with her concerns. Support given and safety maintained with 15 min checks.

## 2022-02-23 NOTE — Progress Notes (Signed)
   02/23/22 1100  Psych Admission Type (Psych Patients Only)  Admission Status Involuntary  Psychosocial Assessment  Patient Complaints None  Eye Contact Brief  Facial Expression Anxious  Affect Anxious  Speech Pressured  Interaction Assertive  Motor Activity Restless  Appearance/Hygiene In scrubs  Behavior Characteristics Cooperative;Appropriate to situation  Mood Anxious;Pleasant  Aggressive Behavior  Effect No apparent injury  Thought Process  Coherency WDL  Content WDL  Delusions None reported or observed  Perception WDL  Hallucination None reported or observed  Judgment Poor  Confusion None  Danger to Self  Current suicidal ideation? Denies  Agreement Not to Harm Self Yes  Description of Agreement verbal  Danger to Others  Danger to Others None reported or observed

## 2022-02-23 NOTE — Progress Notes (Signed)
[facility] MD Progress Note  02/23/2022 11:08 AM Laura Thompson  MRN:  [id number] Subjective:  Subjective Data: Laura Thompson is a 47 year old female with a psychiatric history of bipolar 1 disorder and insomnia who presented to [facility] involuntarily from [facility] for concerns for manic behavior.    On Chart Review: From the admission assessment done by Dr. Earley Favor: Per [facility] psychiatric provider on 11/05/21, has been without Zyprexa at least 1 year because made her feel "jittery." Restarted on Zyprexa 5 mg qHS and Suvorexant 5 mg qHS PRN insomnia. At the time, "Husband reports that she is not sleeping, she is spending money inappropriately, speeding, listening to music, laughing inappropriately, etc. She disputes inappropriateness of her behaviors and goes into detail to justify her actions and their appropriateness."   CLINICAL FACTORS:   Bipolar Disorder:   Mixed State Unstable or Poor Therapeutic Relationship Previous Psychiatric Diagnoses and Treatments History of present illness: The patient was seen today and the chart was reviewed and the case was discussed with nursing staff.  According to staff the patient refused Seroquel reporting that she does not want to take any Seroquel because she does not feel that it is a good drug.  Staff reports that she remains hypomanic and somewhat irritable.  She denies any flulike symptoms at this time.  She is sleeping better and when seen today she was alert oriented and cooperative with slightly pressured speech and claims that she does not want Seroquel at all but is more than willing to take Abilify. At her insistence that Seroquel can be discontinued but there is a documented evidence of her having an allergy to Abilify.  Further investigation reveals that the allergies mainly classified as having manic-like symptoms in March 2019.  However patient denies that this is an allergy and reports that it was just put.  Because she had reported some manic symptoms.   Apparently she is going to be taken out and Abilify is not a contraindication and patient is willing to take a low-dose.  Principal Problem: Bipolar 1 disorder, manic, moderate (HCC) Diagnosis: Principal Problem:   Bipolar 1 disorder, manic, moderate (HCC)  Total Time spent with patient: 30 minutes  Past Psychiatric History: See H&P.  The patient does have a history of bipolar disorder and has seen a therapist in the past and she has been hospitalized several times in the past.  Past Medical History:  Past Medical History:  Diagnosis Date   Bipolar 1 disorder ([place])    Diabetes mellitus without complication ([place])     Past Surgical History:  Procedure Laterality Date   CERVICAL ABLATION     DENTAL SURGERY     lipoma removal     Family History:  Family History  Problem Relation Age of Onset   Thyroid disease Mother    Hypertension Father    Diabetes Father    Glaucoma Father    Family Psychiatric  History: See H&P Social History:  Social History   Substance and Sexual Activity  Alcohol Use No     Social History   Substance and Sexual Activity  Drug Use No    Social History   Socioeconomic History   Marital status: Married    Spouse name: Not on file   Number of children: Not on file   Years of education: Not on file   Highest education level: Not on file  Occupational History   Not on file  Tobacco Use   Smoking status: Never   Smokeless tobacco:  Never  Vaping Use   Vaping Use: Never used  Substance and Sexual Activity   Alcohol use: No   Drug use: No   Sexual activity: Not on file  Other Topics Concern   Not on file  Social History Narrative   Not on file   Social Determinants of Health   Financial Resource Strain: Not on file  Food Insecurity: No Food Insecurity (02/21/2022)   Hunger Vital Sign    Worried About Running Out of Food in the Last Year: Never true    Ran Out of Food in the Last Year: Never true  Transportation Needs: No Transportation  Needs (02/21/2022)   PRAPARE - Hydrologist (Medical): No    Lack of Transportation (Non-Medical): No  Physical Activity: Not on file  Stress: Not on file  Social Connections: Not on file   Additional Social History:                         Sleep: Fair  Appetite:  Fair  Current Medications: Current Facility-Administered Medications  Medication Dose Route Frequency Provider Last Rate Last Admin   acetaminophen (TYLENOL) tablet 650 mg  650 mg Oral Q6H PRN Motley-Mangrum, Jadeka A, PMHNP   650 mg at 02/23/22 0622   alum & mag hydroxide-simeth (MAALOX/MYLANTA) 200-200-20 MG/5ML suspension 30 mL  30 mL Oral Q4H PRN Motley-Mangrum, Jadeka A, PMHNP       hydrOXYzine (ATARAX) tablet 25 mg  25 mg Oral TID PRN Motley-Mangrum, Donneta Romberg A, PMHNP       lamoTRIgine (LAMICTAL) tablet 25 mg  25 mg Oral Daily Rosezetta Schlatter, MD   25 mg at 02/23/22 0759   OLANZapine zydis (ZYPREXA) disintegrating tablet 5 mg  5 mg Oral Q8H PRN Motley-Mangrum, Jadeka A, PMHNP       And   LORazepam (ATIVAN) tablet 1 mg  1 mg Oral PRN Motley-Mangrum, Jadeka A, PMHNP       And   ziprasidone (GEODON) injection 20 mg  20 mg Intramuscular PRN Motley-Mangrum, Jadeka A, PMHNP       magnesium hydroxide (MILK OF MAGNESIA) suspension 30 mL  30 mL Oral Daily PRN Motley-Mangrum, Jadeka A, PMHNP       omega-3 acid ethyl esters (LOVAZA) capsule 1 g  1 g Oral Daily Motley-Mangrum, Jadeka A, PMHNP   1 g at 02/23/22 0759   QUEtiapine (SEROQUEL) tablet 50 mg  50 mg Oral QHS Rosezetta Schlatter, MD       rosuvastatin (CRESTOR) tablet 10 mg  10 mg Oral Daily Motley-Mangrum, Jadeka A, PMHNP   10 mg at 02/23/22 0759   traZODone (DESYREL) tablet 50 mg  50 mg Oral QHS PRN Motley-Mangrum, Jadeka A, PMHNP       zolpidem (AMBIEN) tablet 5 mg  5 mg Oral QHS PRN Motley-Mangrum, Al Pimple, PMHNP        Lab Results:  Results for orders placed or performed during the hospital encounter of 02/21/22 (from the past 48  hour(s))  Lipid panel     Status: Abnormal   Collection Time: 02/22/22  6:44 AM  Result Value Ref Range   Cholesterol 224 (H) 0 - 200 mg/dL   Triglycerides 22 <150 mg/dL   HDL 83 >40 mg/dL   Total CHOL/HDL Ratio 2.7 RATIO   VLDL 4 0 - 40 mg/dL   LDL Cholesterol 137 (H) 0 - 99 mg/dL    Comment:  Total Cholesterol/HDL:CHD Risk Coronary Heart Disease Risk Table                     Men   Women  1/2 Average Risk   3.4   3.3  Average Risk       5.0   4.4  2 X Average Risk   9.6   7.1  3 X Average Risk  23.4   11.0        Use the calculated Patient Ratio above and the CHD Risk Table to determine the patient's CHD Risk.        ATP III CLASSIFICATION (LDL):  <100     mg/dL   Optimal  100-129  mg/dL   Near or Above                    Optimal  130-159  mg/dL   Borderline  160-189  mg/dL   High  >190     mg/dL   Very High Performed at [facility] [address] [place], [place] [address]   TSH     Status: None   Collection Time: 02/22/22  6:44 AM  Result Value Ref Range   TSH 3.074 0.350 - 4.500 uIU/mL    Comment: Performed by a 3rd Generation assay with a functional sensitivity of <=0.01 uIU/mL. Performed at [facility], [place] [address], [place], [place] [address]     Blood Alcohol level:  Lab Results  Component Value Date   [facility] <10 11/07/2021   ETH <10 [id number]    Metabolic Disorder Labs: Lab Results  Component Value Date   HGBA1C 7.8 (H) 03/12/2017   MPG 177.16 03/12/2017   No results found for: "PROLACTIN" Lab Results  Component Value Date   CHOL 224 (H) 02/22/2022   TRIG 22 02/22/2022   HDL 83 02/22/2022   CHOLHDL 2.7 02/22/2022   VLDL 4 02/22/2022   LDLCALC 137 (H) 02/22/2022   LDLCALC 153 (H) 03/12/2017    Physical Findings: AIMS: Facial and Oral Movements Muscles of Facial Expression: None, normal Lips and Perioral Area: None, normal Jaw: None, normal Tongue: None, normal,Extremity Movements Upper  (arms, wrists, hands, fingers): None, normal Lower (legs, knees, ankles, toes): None, normal, Trunk Movements Neck, shoulders, hips: None, normal, Overall Severity Severity of abnormal movements (highest score from questions above): None, normal Incapacitation due to abnormal movements: None, normal Patient's awareness of abnormal movements (rate only patient's report): No Awareness, Dental Status Current problems with teeth and/or dentures?: No Does patient usually wear dentures?: No  CIWA:    COWS:     Musculoskeletal: Strength & Muscle Tone: within normal limits Gait & Station: normal Patient leans: N/A  Psychiatric Specialty Exam:  Presentation  General Appearance:  Appropriate for Environment; Disheveled  Eye Contact: Fair  Speech: Pressured  Speech Volume: Increased  Handedness: Right   Mood and Affect  Mood: Anxious; Irritable; Labile  Affect: Labile; Full Range   Thought Process  Thought Processes: Goal Directed  Descriptions of Associations:Intact  Orientation:Full (Time, Place and Person)  Thought Content:Rumination; Perseveration; Tangential  History of Schizophrenia/Schizoaffective disorder:No  Duration of Psychotic Symptoms:N/A  Hallucinations:Hallucinations: None  Ideas of Reference:None  Suicidal Thoughts:Suicidal Thoughts: No  Homicidal Thoughts:Homicidal Thoughts: No   Sensorium  Memory: Immediate Fair; Recent Fair; Remote Fair  Judgment: Fair  Insight: Fair   Community education officer  Concentration: Poor  Attention Span: Fair  Recall: [employer] of Knowledge: Fair  Language:  Fair   Psychomotor Activity  Psychomotor Activity: Psychomotor Activity: Normal   Assets  Assets: Communication Skills; Desire for Improvement; Social Support; Transportation   Sleep  Sleep: Sleep: Fair Number of Hours of Sleep: 6.5    Physical Exam: Physical Exam ROS Blood pressure 98/76, pulse (!) 107, temperature 98.3  F (36.8 C), temperature source Oral, resp. rate 16, height 5\' 3"  (1.6 m), weight 74.8 kg, SpO2 100 %. Body mass index is 29.23 kg/m.   Treatment Plan Summary: Daily contact with patient to assess and evaluate symptoms and progress in treatment, Medication management, and Plan   ASSESSMENT:  Diagnoses / Active Problems: See H&P.  PLAN: Safety and Monitoring:  -- Continue involuntary admission to inpatient psychiatric unit for safety, stabilization and treatment  -- Daily contact with patient to assess and evaluate symptoms and progress in treatment  -- Patient's case to be discussed in multi-disciplinary team meeting  -- Observation Level : q15 minute checks  -- Vital signs:  q12 hours  -- Precautions: suicide, elopement, and assault  2. Psychiatric Diagnoses and Treatment:    -- Continue to assess the risks/benefits/side-effects/alternatives to this medication were discussed in detail with the patient and time was given for questions. The patient consents to medication trial.  -- Discontinue Seroquel and begin Abilify 5 mg a day starting today  -- Metabolic profile and EKG monitoring obtained while on an atypical antipsychotic (BMI: Lipid Panel: HbgA1c: QTc:) has been ordered today.  -- Encouraged patient to participate in unit milieu and in scheduled group therapies   -- Short Term Goals: Ability to identify changes in lifestyle to reduce recurrence of condition will improve, Ability to verbalize feelings will improve, Ability to disclose and discuss suicidal ideas, Ability to demonstrate self-control will improve, Ability to identify and develop effective coping behaviors will improve, and Ability to maintain clinical measurements within normal limits will improve  -- Long Term Goals: Improvement in symptoms so as ready for discharge    3. Medical Issues Being Addressed:   Tobacco Use Disorder  -- Nicotine patch 21mg /24 hours ordered  -- Smoking cessation encouraged  4.  Discharge Planning:   -- Social work and case management to assist with discharge planning and identification of hospital follow-up needs prior to discharge  -- Estimated LOS: 5-7 days  -- Discharge Concerns: Need to establish a safety plan; Medication compliance and effectiveness  -- Discharge Goals: Return home with outpatient referrals for mental health follow-up including medication management/psychotherapy   I certify that inpatient services furnished can reasonably be expected to improve the patient's condition.     , MD 02/23/2022, 11:08 AM

## 2022-02-24 ENCOUNTER — Encounter (HOSPITAL_COMMUNITY): Payer: Self-pay

## 2022-02-24 DIAGNOSIS — F3112 Bipolar disorder, current episode manic without psychotic features, moderate: Secondary | ICD-10-CM

## 2022-02-24 LAB — HEMOGLOBIN A1C
Hgb A1c MFr Bld: 8.1 % — ABNORMAL HIGH (ref 4.8–5.6)
Mean Plasma Glucose: 186 mg/dL

## 2022-02-24 NOTE — Inpatient Diabetes Management (Signed)
Inpatient Diabetes Program Recommendations  AACE/ADA: New Consensus Statement on Inpatient Glycemic Control (2015)  Target Ranges:  Prepandial:   less than 140 mg/dL      Peak postprandial:   less than 180 mg/dL (1-2 hours)      Critically ill patients:  140 - 180 mg/dL   Lab Results  Component Value Date   GLUCAP 205 (H) 11/08/2021   HGBA1C 7.8 (H) 03/12/2017    Review of Glycemic Control  Latest Reference Range & Units 02/23/22 18:29  Glucose 70 - 99 mg/dL 298 (H)   Diabetes history: DM 2 diagnosed on 2020 Outpatient Diabetes medications: Trulicity 6.60 mg QSaturday Current orders for Inpatient glycemic control:  None  A1c pending  Inpatient Diabetes Program Recommendations:    -  Start Novolog 0-15 units tid + hs  Thanks,  Tama Headings RN, MSN, BC-ADM Inpatient Diabetes Coordinator Team Pager [phone] (8a-5p)

## 2022-02-24 NOTE — Progress Notes (Signed)
Pt visible on the unit much of the evening, writer talked to pt about situation, pt appeared to listen, pt agreed to take the Trazodone this evening.     02/24/22 2130  Psych Admission Type (Psych Patients Only)  Admission Status Involuntary  Psychosocial Assessment  Patient Complaints None  Eye Contact Brief  Facial Expression Anxious  Affect Anxious  Speech Soft  Interaction Assertive  Motor Activity Restless  Appearance/Hygiene Unremarkable  Behavior Characteristics Cooperative  Mood Anxious;Pleasant  Aggressive Behavior  Effect No apparent injury  Thought Process  Coherency WDL  Content WDL  Delusions WDL  Perception WDL  Hallucination None reported or observed  Judgment Poor  Confusion None  Danger to Self  Current suicidal ideation? Denies  Danger to Others  Danger to Others None reported or observed

## 2022-02-24 NOTE — Group Note (Signed)
   Date/Time: 02/24/2022 @ 1pm  Type of Therapy and Topic:  Group Therapy:  triggers  Participation Level:  Did not attend  Description of Group:   Recognizing Triggers: Patients defined triggers and discussed the importance of recognizing their personal warning signs. Patients identified their own triggers and how they tend to cope with stressful situations. Patients discussed areas such as people, places, things, and thoughts that rigger certain emotions for them. CSW provided support to patients and discussed safety planning for when these triggers occur. Group participants had opportunities to share openly with the group and participate in a group discussion while providing support and feedback to their peers.  Therapeutic Goals: Patient will identify triggers that are contributing to a problem in their life Patient will identify unwanted behaviors and feelings associated with a trigger.  Patient will share with other group members strategies to confront and avoid triggers so that they may be able to react appropriately to triggers in daily life.    Summary of Patient Progress: x     Therapeutic Modalities:   Cognitive Behavioral Therapy Solution Focused Therapy Motivational Interviewing Family Systems Approach   Curlie Sittner, LCSW, LCAS Clincal Social Worker  [facility]  

## 2022-02-24 NOTE — Group Note (Signed)
Recreation Therapy Group Note   Group Topic:Coping Skills  Group Date: 02/24/2022 Start Time: 1006 End Time: 7782 Facilitators: Almeta Geisel-McCall, LRT,CTRS Location: [address] Dayroom   Goal Area(s) Addresses: Patient will define what a coping skill is. Patient will work to create a list of healthy coping skills beginning with each letter of the alphabet. Patient will successfully identify positive coping skills they can use post d/c.  Patient will acknowledge benefit(s) of using learned coping skills post d/c.  Group Description: Coping A to Z. Patient asked to identify what a coping skill is and when they use them. Patients with Probation officer discussed healthy versus unhealthy coping skills. Next patients were given a blank worksheet titled "Coping Skills A-Z". Patients were instructed to come up with at least one positive coping skill per letter of the alphabet. Patients were given 15 minutes to brainstorm, before ideas were presented to the large group. Patients and LRT debriefed on the importance of coping skill selection based on situation and back-up plans when a skill tried is not effective. At the end of group, patients were given an handout of alphabetized strategies to keep for future reference.   Affect/Mood: Flat   Participation Level: Moderate   Participation Quality: Independent   Behavior: Cooperative and Drowsy   Speech/Thought Process: Irrational   Insight: Poor   Judgement: Poor   Modes of Intervention: Worksheet   Patient Response to Interventions:  Receptive   Education Outcome:  Acknowledges education and In group clarification offered    Clinical Observations/Individualized Feedback: Pt started off attentive during group session.  Pt expressed coping skills were "your way of handling situations".  Pt also expressed people tend to use negative coping skills because you enter "flight or fight" mode.  Pt would fall asleep for short periods, wake up and then  work on her sheet.  What pt identified as coping skills, were not coping skills but objects.  Pt identified driving, water, ice cream, quiche, zucchini, unique, jazz, cheese, exercise, candy and baked goods as her top 10 coping skills.    Plan: Continue to engage patient in RT group sessions 2-3x/week.   Laura Thompson, LRT,CTRS 02/24/2022 2:00 PM

## 2022-02-24 NOTE — Progress Notes (Signed)
   02/24/22 2409  15 Minute Checks  Location Dayroom  Visual Appearance Calm  Behavior Composed  Sleep (Behavioral Health Patients Only)  Calculate sleep? (Click Yes once per 24 hr at 0600 safety check) Yes  Documented sleep last 24 hours 9.5

## 2022-02-24 NOTE — Progress Notes (Signed)
[facility] MD Progress Note  02/24/2022 1:22 PM Jesika Men  MRN:  [id number] Subjective:   Jone Panebianco is a 47 yr old female who presented to [facility] on 1/3 under IVC for manic, erratic behavior, she was admitted to [facility] on 1/6.  PPHx is significant for Bipolar Disorder, 4+ Psychiatric Hospitalizations (last 09/2020), and 1 episode of Suicidal Gesture (put gun to head age 61).   Case was discussed in the multidisciplinary team. MAR was reviewed and patient was compliant with medications.  She did not receive any PRN medications.   Psychiatric Team made the following recommendations yesterday: -Start Abilify 5 mg daily -Stop Seroquel    On interview today patient reports she slept good last night.  She reports her appetite is doing good.  She reports no SI, HI, or AVH.  She reports no Paranoia, Ideas of Reference, or other First Rank symptoms.  She reports no issues with her medications.  She reports that she wants to leave and that she has been taking her medications, eating, and attending groups.  She reports that she is "doing what a good patient would do."  She reports that her husband has IVC'd her 4 previous times.  She reports that this was because he wanted to look at her phone because he was accusing her of cheating.  She reports that he had promised he would never do this again but he did.  She reports that this is why she rescinded all permission to talk to her family because they would lie.  Discussed with her that to even begin to discuss discharge we would need to do safety planning with someone.  She reports that she would be agreeable to letting us contact her cousin and she would get that cousin's phone number.  She reports no other concerns at present.  Principal Problem: Bipolar 1 disorder, manic, moderate (HCC) Diagnosis: Principal Problem:   Bipolar 1 disorder, manic, moderate (HCC)  Total Time spent with patient:  I personally spent 35 minutes on the unit in direct  patient care. The direct patient care time included face-to-face time with the patient, reviewing the patient's chart, communicating with other professionals, and coordinating care. Greater than 50% of this time was spent in counseling or coordinating care with the patient regarding goals of hospitalization, psycho-education, and discharge planning needs.   Past Psychiatric History: Bipolar Disorder, 4+ Psychiatric Hospitalizations (last 09/2020), and 1 episode of Suicidal Gesture (put gun to head age 53).  Past Medical History:  Past Medical History:  Diagnosis Date   Bipolar 1 disorder (HCC)    Diabetes mellitus without complication (HCC)     Past Surgical History:  Procedure Laterality Date   CERVICAL ABLATION     DENTAL SURGERY     lipoma removal     Family History:  Family History  Problem Relation Age of Onset   Thyroid disease Mother    Hypertension Father    Diabetes Father    Glaucoma Father    Family Psychiatric  History: Reports No Known Diagnosis', Substance Abuse, Suicides. Social History:  Social History   Substance and Sexual Activity  Alcohol Use No     Social History   Substance and Sexual Activity  Drug Use No    Social History   Socioeconomic History   Marital status: Married    Spouse name: Not on file   Number of children: Not on file   Years of education: Not on file   Highest education level: Not on  file  Occupational History   Not on file  Tobacco Use   Smoking status: Never   Smokeless tobacco: Never  Vaping Use   Vaping Use: Never used  Substance and Sexual Activity   Alcohol use: No   Drug use: No   Sexual activity: Not on file  Other Topics Concern   Not on file  Social History Narrative   Not on file   Social Determinants of Health   Financial Resource Strain: Not on file  Food Insecurity: No Food Insecurity (02/21/2022)   Hunger Vital Sign    Worried About Running Out of Food in the Last Year: Never true    Ran Out of Food  in the Last Year: Never true  Transportation Needs: No Transportation Needs (02/21/2022)   PRAPARE - Hydrologist (Medical): No    Lack of Transportation (Non-Medical): No  Physical Activity: Not on file  Stress: Not on file  Social Connections: Not on file   Additional Social History:                         Sleep: Good  Appetite:  Good  Current Medications: Current Facility-Administered Medications  Medication Dose Route Frequency Provider Last Rate Last Admin   acetaminophen (TYLENOL) tablet 650 mg  650 mg Oral Q6H PRN Motley-Mangrum, Jadeka A, PMHNP   650 mg at 02/23/22 0622   alum & mag hydroxide-simeth (MAALOX/MYLANTA) 200-200-20 MG/5ML suspension 30 mL  30 mL Oral Q4H PRN Motley-Mangrum, Jadeka A, PMHNP       ARIPiprazole (ABILIFY) tablet 5 mg  5 mg Oral Daily Ranae Palms, MD   5 mg at 02/24/22 4098   hydrOXYzine (ATARAX) tablet 25 mg  25 mg Oral TID PRN Motley-Mangrum, Donneta Romberg A, PMHNP       lamoTRIgine (LAMICTAL) tablet 25 mg  25 mg Oral Daily Rosezetta Schlatter, MD   25 mg at 02/24/22 0811   OLANZapine zydis (ZYPREXA) disintegrating tablet 5 mg  5 mg Oral Q8H PRN Motley-Mangrum, Jadeka A, PMHNP       And   LORazepam (ATIVAN) tablet 1 mg  1 mg Oral PRN Motley-Mangrum, Jadeka A, PMHNP       And   ziprasidone (GEODON) injection 20 mg  20 mg Intramuscular PRN Motley-Mangrum, Jadeka A, PMHNP       magnesium hydroxide (MILK OF MAGNESIA) suspension 30 mL  30 mL Oral Daily PRN Motley-Mangrum, Jadeka A, PMHNP       omega-3 acid ethyl esters (LOVAZA) capsule 1 g  1 g Oral Daily Motley-Mangrum, Jadeka A, PMHNP   1 g at 02/24/22 0811   rosuvastatin (CRESTOR) tablet 10 mg  10 mg Oral Daily Motley-Mangrum, Jadeka A, PMHNP   10 mg at 02/24/22 0811   traZODone (DESYREL) tablet 50 mg  50 mg Oral QHS PRN Motley-Mangrum, Jadeka A, PMHNP       zolpidem (AMBIEN) tablet 5 mg  5 mg Oral QHS PRN Motley-Mangrum, Al Pimple, PMHNP        Lab Results:  Results  for orders placed or performed during the hospital encounter of 02/21/22 (from the past 48 hour(s))  CBC with Differential/Platelet     Status: None   Collection Time: 02/23/22  6:29 PM  Result Value Ref Range   WBC 7.2 4.0 - 10.5 K/uL   RBC 4.76 3.87 - 5.11 MIL/uL   Hemoglobin 13.2 12.0 - 15.0 g/dL   HCT 41.3 36.0 - 46.0 %  MCV 86.8 80.0 - 100.0 fL   MCH 27.7 26.0 - 34.0 pg   MCHC 32.0 30.0 - 36.0 g/dL   RDW 21.3 08.6 - 57.8 %   Platelets 320 150 - 400 K/uL   nRBC 0.0 0.0 - 0.2 %   Neutrophils Relative % 64 %   Neutro Abs 4.6 1.7 - 7.7 K/uL   Lymphocytes Relative 25 %   Lymphs Abs 1.8 0.7 - 4.0 K/uL   Monocytes Relative 9 %   Monocytes Absolute 0.7 0.1 - 1.0 K/uL   Eosinophils Relative 1 %   Eosinophils Absolute 0.1 0.0 - 0.5 K/uL   Basophils Relative 1 %   Basophils Absolute 0.0 0.0 - 0.1 K/uL   Immature Granulocytes 0 %   Abs Immature Granulocytes 0.03 0.00 - 0.07 K/uL    Comment: Performed at [facility], [address], [place], [place] [address]  Basic metabolic panel     Status: Abnormal   Collection Time: 02/23/22  6:29 PM  Result Value Ref Range   Sodium 131 (L) 135 - 145 mmol/L   Potassium 4.0 3.5 - 5.1 mmol/L   Chloride 100 98 - 111 mmol/L   CO2 20 (L) 22 - 32 mmol/L   Glucose, Bld 298 (H) 70 - 99 mg/dL    Comment: Glucose reference range applies only to samples taken after fasting for at least 8 hours.   BUN 13 6 - 20 mg/dL   Creatinine, Ser 9.52 0.44 - 1.00 mg/dL   Calcium 9.0 8.9 - 84.1 mg/dL   GFR, Estimated >32 >44 mL/min    Comment: (NOTE) Calculated using the CKD-EPI Creatinine Equation (2021)    Anion gap 11 5 - 15    Comment: Performed at [facility], [address] [place], [place] [address]    Blood Alcohol level:  Lab Results  Component Value Date   ETH <10 11/07/2021   ETH <10 12/03/2020    Metabolic Disorder Labs: Lab Results  Component Value Date   HGBA1C 8.1 (H) 02/22/2022   MPG 186  02/22/2022   MPG 177.16 03/12/2017   No results found for: "PROLACTIN" Lab Results  Component Value Date   CHOL 224 (H) 02/22/2022   TRIG 22 02/22/2022   HDL 83 02/22/2022   CHOLHDL 2.7 02/22/2022   VLDL 4 02/22/2022   LDLCALC 137 (H) 02/22/2022   LDLCALC 153 (H) 03/12/2017    Physical Findings: AIMS: Facial and Oral Movements Muscles of Facial Expression: None, normal Lips and Perioral Area: None, normal Jaw: None, normal Tongue: None, normal,Extremity Movements Upper (arms, wrists, hands, fingers): None, normal Lower (legs, knees, ankles, toes): None, normal, Trunk Movements Neck, shoulders, hips: None, normal, Overall Severity Severity of abnormal movements (highest score from questions above): None, normal Incapacitation due to abnormal movements: None, normal Patient's awareness of abnormal movements (rate only patient's report): No Awareness, Dental Status Current problems with teeth and/or dentures?: No Does patient usually wear dentures?: No  CIWA:    COWS:     Musculoskeletal: Strength & Muscle Tone: within normal limits Gait & Station: normal Patient leans: N/A  Psychiatric Specialty Exam:  Presentation  General Appearance:  Appropriate for Environment; Casual (hair unkempt)  Eye Contact: Fair  Speech: Pressured; Clear and Coherent  Speech Volume: Normal  Handedness: Right   Mood and Affect  Mood: Anxious; Dysphoric; Labile  Affect: Labile   Thought Process  Thought Processes: Goal Directed (with some disorganization)  Descriptions of Associations:Intact  Orientation:Full (Time,  Place and Person)  Thought Content:Rumination; Paranoid Ideation  History of Schizophrenia/Schizoaffective disorder:No  Duration of Psychotic Symptoms:N/A  Hallucinations:Hallucinations: None  Ideas of Reference:None  Suicidal Thoughts:Suicidal Thoughts: No  Homicidal Thoughts:Homicidal Thoughts: No   Sensorium  Memory: Immediate Fair; Recent  Fair  Judgment: Poor  Insight: Poor   Executive Functions  Concentration: Poor  Attention Span: Poor  Recall: Fair  Fund of Knowledge: Fair  Language: Fair   Psychomotor Activity  Psychomotor Activity: Psychomotor Activity: Normal   Assets  Assets: Manufacturing systems engineer; Desire for Improvement; Resilience   Sleep  Sleep: Sleep: Good Number of Hours of Sleep: 9.5    Physical Exam: Physical Exam Vitals and nursing note reviewed.  Constitutional:      General: She is not in acute distress.    Appearance: Normal appearance. She is normal weight. She is not ill-appearing or toxic-appearing.  HENT:     Head: Normocephalic and atraumatic.  Pulmonary:     Effort: Pulmonary effort is normal.  Musculoskeletal:        General: Normal range of motion.  Neurological:     General: No focal deficit present.     Mental Status: She is alert.    Review of Systems  Respiratory:  Negative for cough and shortness of breath.   Cardiovascular:  Negative for chest pain.  Gastrointestinal:  Negative for abdominal pain, constipation, diarrhea, nausea and vomiting.  Neurological:  Negative for dizziness, weakness and headaches.  Psychiatric/Behavioral:  Negative for depression, hallucinations and suicidal ideas. The patient is not nervous/anxious.    Blood pressure 100/87, pulse (!) 119, temperature 98 F (36.7 C), temperature source Oral, resp. rate 16, height 5\' 3"  (1.6 m), weight 74.8 kg, SpO2 100 %. Body mass index is 29.23 kg/m.   Treatment Plan Summary: Daily contact with patient to assess and evaluate symptoms and progress in treatment and Medication management  Kiasha Hannis is a 47 yr old female who presented to [facility] on 1/3 under IVC for manic, erratic behavior, she was admitted to [facility] on 1/6.  PPHx is significant for Bipolar Disorder, 4+ Psychiatric Hospitalizations (last 09/2020), and 1 episode of Suicidal Gesture (put gun to head age 19).   Nataley has  tolerated starting the Abilify.  She continues to be manic and pressured in her speech, she is ruminating on discharge but disorganized in other things.  She continues to claim this is something done by her husband in retaliation and show paranoia by refusing to talk in the hallway.  She was agreeable to letting Social Work contact her cousin which is a step in the right direction as she had rescinded the ability to contact anyone.  She is not willing to increase her medication at this time.  We will continue to monitor.    Bipolar Disorder: -Continue Abilify 5 mg daily -Continue Lamictal 25 mg daily -Continue Agitation Protocol: Zyprexa/Ativan/Geodon   Nicotine Dependence: -Continue Nicotine Patch 21 mg daily   -Continue Ambien 5 mg QHS PRN -Continue Crestor 10 mg daily -Continue Lovaza 1 g daily -Continue PRN's: Tylenol, Maalox, Atarax, Milk of Magnesia, Trazodone   14, MD 02/24/2022, 1:22 PM

## 2022-02-24 NOTE — Plan of Care (Signed)
  Problem: Coping: Goal: Ability to verbalize frustrations and anger appropriately will improve Outcome: Not Progressing Goal: Ability to demonstrate self-control will improve Outcome: Not Progressing   Problem: Education: Goal: Emotional status will improve Outcome: Not Progressing Goal: Mental status will improve Outcome: Not Progressing

## 2022-02-24 NOTE — Progress Notes (Signed)
Adult Psychoeducational Group Note  Date:  02/24/2022 Time:  8:30 PM  Group Topic/Focus:  Wrap-Up Group:   The focus of this group is to help patients review their daily goal of treatment and discuss progress on daily workbooks.  Participation Level:  Active  Participation Quality:  Appropriate  Affect:  Appropriate  Cognitive:  Appropriate  Insight: Appropriate  Engagement in Group:  Engaged  Modes of Intervention:  Discussion  Additional Comments:   Pt states that she had a "good" day and is excited about her D/C on Wednesday. Pt states she spoke with her husband and they came to a reasonable agreement and that her daughter is moving. Pt did express some resistance to complying with her medication and treatment but states that she's willing to for the time that she's here. Pt was encouraged to remain on her medication post D/C. Pt denies everything.    Gerhard Perches 02/24/2022, 8:30 PM

## 2022-02-24 NOTE — B[facility] Group Notes (Signed)
Child/Adolescent Psychoeducational Group Note  Date:  02/24/2022 Time:  9:48 AM  Group Topic/Focus:  Goals Group:   The focus of this group is to help patients establish daily goals to achieve during treatment and discuss how the patient can incorporate goal setting into their daily lives to aide in recovery.  Participation Level:  Did Not Attend  Additional Comments:  Patient was told multiple times that group was starting but still did not attend.  Gloyd Happ T Matheus Spiker 02/24/2022, 9:48 AM

## 2022-02-24 NOTE — BH IP Treatment Plan (Signed)
Interdisciplinary Treatment and Diagnostic Plan Update  02/24/2022 Time of Session: 9:35 AM  Laura Thompson MRN: [id number]  Principal Diagnosis: Bipolar 1 disorder, manic, moderate ([place])  Secondary Diagnoses: Principal Problem:   Bipolar 1 disorder, manic, moderate (HCC)   Current Medications:  Current Facility-Administered Medications  Medication Dose Route Frequency Provider Last Rate Last Admin   acetaminophen (TYLENOL) tablet 650 mg  650 mg Oral Q6H PRN Motley-Mangrum, Jadeka A, PMHNP   650 mg at 02/23/22 0622   alum & mag hydroxide-simeth (MAALOX/MYLANTA) 200-200-20 MG/5ML suspension 30 mL  30 mL Oral Q4H PRN Motley-Mangrum, Jadeka A, PMHNP       ARIPiprazole (ABILIFY) tablet 5 mg  5 mg Oral Daily Ranae Palms, MD   5 mg at 02/24/22 [id number]   hydrOXYzine (ATARAX) tablet 25 mg  25 mg Oral TID PRN Motley-Mangrum, Donneta Romberg A, PMHNP       lamoTRIgine (LAMICTAL) tablet 25 mg  25 mg Oral Daily Rosezetta Schlatter, MD   25 mg at 02/24/22 0811   OLANZapine zydis (ZYPREXA) disintegrating tablet 5 mg  5 mg Oral Q8H PRN Motley-Mangrum, Jadeka A, PMHNP       And   LORazepam (ATIVAN) tablet 1 mg  1 mg Oral PRN Motley-Mangrum, Jadeka A, PMHNP       And   ziprasidone (GEODON) injection 20 mg  20 mg Intramuscular PRN Motley-Mangrum, Jadeka A, PMHNP       magnesium hydroxide (MILK OF MAGNESIA) suspension 30 mL  30 mL Oral Daily PRN Motley-Mangrum, Jadeka A, PMHNP       omega-3 acid ethyl esters (LOVAZA) capsule 1 g  1 g Oral Daily Motley-Mangrum, Jadeka A, PMHNP   1 g at 02/24/22 0811   rosuvastatin (CRESTOR) tablet 10 mg  10 mg Oral Daily Motley-Mangrum, Jadeka A, PMHNP   10 mg at 02/24/22 0811   traZODone (DESYREL) tablet 50 mg  50 mg Oral QHS PRN Motley-Mangrum, Jadeka A, PMHNP       zolpidem (AMBIEN) tablet 5 mg  5 mg Oral QHS PRN Motley-Mangrum, Jadeka A, PMHNP       PTA Medications: Medications Prior to Admission  Medication Sig Dispense Refill Last Dose   Ascorbic Acid (VITAMIN C PO) Take 1  tablet by mouth daily. (Patient not taking: Reported on 02/20/2022)      Cholecalciferol (VITAMIN D3 PO) Take 1 capsule by mouth daily. (Patient not taking: Reported on 02/20/2022)      cyclobenzaprine (FLEXERIL) 10 MG tablet Take one tab PO TID PRN muscle spasm (Patient not taking: Reported on 11/07/2021) 20 tablet 0    fluticasone (FLONASE) 50 MCG/ACT nasal spray Place 1-2 sprays into both nostrils daily. (Patient not taking: Reported on 02/20/2022)      Ibuprofen 200 MG CAPS Take 200-400 mg by mouth every 6 (six) hours as needed (for mild pain or headaches). (Patient not taking: Reported on 02/20/2022)      methocarbamol (ROBAXIN) 500 MG tablet Take 500 mg by mouth 4 (four) times daily. (Patient not taking: Reported on 02/20/2022)      ODORLESS GARLIC PO Take 1 capsule by mouth daily. (Patient not taking: Reported on 02/20/2022)      Omega-3 Fatty Acids (FISH OIL PO) Take 1 capsule by mouth daily. (Patient not taking: Reported on 02/20/2022)      rosuvastatin (CRESTOR) 10 MG tablet Take 10 mg by mouth daily. (Patient not taking: Reported on [id number])      TRULICITY [id number] SOPN Inject 0.75 mg into the skin every Saturday. (  Patient not taking: Reported on 02/20/2022)      VITAMIN A PO Take 1 tablet by mouth daily. (Patient not taking: Reported on 02/20/2022)      zinc gluconate 50 MG tablet Take 50 mg by mouth daily. (Patient not taking: Reported on 02/20/2022)      zolpidem (AMBIEN) 10 MG tablet Take 10 mg by mouth at bedtime as needed for sleep. (Patient not taking: Reported on 02/20/2022)       Patient Stressors: Marital or family conflict   Medication change or noncompliance   Occupational concerns    Patient Strengths: Capable of independent living  Communication skills  Supportive family/friends  Work skills   Treatment Modalities: Medication Management, Group therapy, Case management,  1 to 1 session with clinician, Psychoeducation, Recreational therapy.   Physician Treatment Plan for Primary  Diagnosis: Bipolar 1 disorder, manic, moderate (HCC) Long Term Goal(s): Improvement in symptoms so as ready for discharge   Short Term Goals: Ability to identify changes in lifestyle to reduce recurrence of condition will improve Ability to verbalize feelings will improve Ability to disclose and discuss suicidal ideas Ability to demonstrate self-control will improve Ability to identify and develop effective coping behaviors will improve Ability to maintain clinical measurements within normal limits will improve  Medication Management: Evaluate patient's response, side effects, and tolerance of medication regimen.  Therapeutic Interventions: 1 to 1 sessions, Unit Group sessions and Medication administration.  Evaluation of Outcomes: Not Progressing  Physician Treatment Plan for Secondary Diagnosis: Principal Problem:   Bipolar 1 disorder, manic, moderate ([place])  Long Term Goal(s): Improvement in symptoms so as ready for discharge   Short Term Goals: Ability to identify changes in lifestyle to reduce recurrence of condition will improve Ability to verbalize feelings will improve Ability to disclose and discuss suicidal ideas Ability to demonstrate self-control will improve Ability to identify and develop effective coping behaviors will improve Ability to maintain clinical measurements within normal limits will improve     Medication Management: Evaluate patient's response, side effects, and tolerance of medication regimen.  Therapeutic Interventions: 1 to 1 sessions, Unit Group sessions and Medication administration.  Evaluation of Outcomes: Not Progressing   RN Treatment Plan for Primary Diagnosis: Bipolar 1 disorder, manic, moderate (HCC) Long Term Goal(s): Knowledge of disease and therapeutic regimen to maintain health will improve  Short Term Goals: Ability to remain free from injury will improve, Ability to verbalize frustration and anger appropriately will improve, Ability to  demonstrate self-control, Ability to participate in decision making will improve, Ability to verbalize feelings will improve, Ability to disclose and discuss suicidal ideas, Ability to identify and develop effective coping behaviors will improve, and Compliance with prescribed medications will improve  Medication Management: RN will administer medications as ordered by provider, will assess and evaluate patient's response and provide education to patient for prescribed medication. RN will report any adverse and/or side effects to prescribing provider.  Therapeutic Interventions: 1 on 1 counseling sessions, Psychoeducation, Medication administration, Evaluate responses to treatment, Monitor vital signs and CBGs as ordered, Perform/monitor CIWA, COWS, AIMS and Fall Risk screenings as ordered, Perform wound care treatments as ordered.  Evaluation of Outcomes: Not Progressing   LCSW Treatment Plan for Primary Diagnosis: Bipolar 1 disorder, manic, moderate ([place]) Long Term Goal(s): Safe transition to appropriate next level of care at discharge, Engage patient in therapeutic group addressing interpersonal concerns.  Short Term Goals: Engage patient in aftercare planning with referrals and resources, Increase social support, Increase ability to appropriately verbalize feelings,  Increase emotional regulation, Facilitate acceptance of mental health diagnosis and concerns, Facilitate patient progression through stages of change regarding substance use diagnoses and concerns, Identify triggers associated with mental health/substance abuse issues, and Increase skills for wellness and recovery  Therapeutic Interventions: Assess for all discharge needs, 1 to 1 time with Social worker, Explore available resources and support systems, Assess for adequacy in community support network, Educate family and significant other(s) on suicide prevention, Complete Psychosocial Assessment, Interpersonal group  therapy.  Evaluation of Outcomes: Not Progressing   Progress in Treatment: Attending groups: No. Participating in groups: No. Taking medication as prescribed: Yes. Toleration medication: Yes. Family/Significant other contact made: No, will contact:  Patient declined for CSW to call anyone  Patient understands diagnosis: No. Discussing patient identified problems/goals with staff: Yes. Medical problems stabilized or resolved: Yes. Denies suicidal/homicidal ideation: Yes. Issues/concerns per patient self-inventory: No.   New problem(s) identified: No, Describe:  None reported   New Short Term/Long Term Goal(s): medication stabilization, elimination of SI thoughts, development of comprehensive mental wellness plan.    Patient Goals: " It has been a good stay, I have took my med's, got some rest , ate good, and been participating in group. 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 CSW will continue to follow and assess for appropriate referrals and possible discharge planning.    Reason for Continuation of Hospitalization: Aggression Delusions  Mania Medication stabilization   Estimated Length of Stay: 3-5 days   Last 3 [place] Suicide Severity Risk Score: Flowsheet Row [facility] (Current) [facility] from 02/19/2022 in [facility] [place] [facility] from 12/11/2021 in [facility] RISK CATEGORY No Risk No Risk Error: Question 6 not populated       Last [facility] 2/9 Scores:    05/11/2021   10:24 AM 06/01/2018    3:46 PM 09/14/2015    1:00 PM  Depression screen PHQ 2/9  Decreased Interest 1 0 0  Down, Depressed, Hopeless 1 0 0  PHQ - 2 Score 2 0 0  Altered sleeping 3    Tired, decreased energy 3    Change in appetite 0    Feeling bad or failure about  yourself  0    Trouble concentrating 0    Moving slowly or fidgety/restless 0    Suicidal thoughts 0    PHQ-9 Score 8    Difficult doing work/chores Not difficult at all      Scribe for Treatment Team: Isabella Bowens, LCSWA 02/24/2022 10:10 AM

## 2022-02-24 NOTE — Progress Notes (Signed)
   02/24/22 0900  Psych Admission Type (Psych Patients Only)  Admission Status Involuntary  Psychosocial Assessment  Patient Complaints None  Eye Contact Brief  Facial Expression Anxious;Animated  Affect Anxious  Speech Pressured  Interaction Assertive  Motor Activity Restless  Appearance/Hygiene Unremarkable  Behavior Characteristics Cooperative;Appropriate to situation  Mood Anxious;Pleasant  Aggressive Behavior  Effect No apparent injury  Thought Process  Coherency WDL  Content WDL  Delusions None reported or observed  Perception WDL  Hallucination None reported or observed  Judgment Poor  Confusion None  Danger to Self  Current suicidal ideation? Denies  Agreement Not to Harm Self Yes  Description of Agreement verbal  Danger to Others  Danger to Others None reported or observed

## 2022-02-25 LAB — HEMOGLOBIN A1C
Hgb A1c MFr Bld: 8 % — ABNORMAL HIGH (ref 4.8–5.6)
Mean Plasma Glucose: 183 mg/dL

## 2022-02-25 MED ORDER — ARIPIPRAZOLE 10 MG PO TABS
10.0000 mg | ORAL_TABLET | Freq: Every day | ORAL | Status: DC
  Administered 2022-02-26 – 2022-02-28 (×3): 10 mg via ORAL
  Filled 2022-02-25 (×5): qty 1
  Filled 2022-02-25: qty 7

## 2022-02-25 MED ORDER — LAMOTRIGINE 25 MG PO TABS
25.0000 mg | ORAL_TABLET | Freq: Two times a day (BID) | ORAL | Status: DC
  Administered 2022-02-25 – 2022-02-28 (×6): 25 mg via ORAL
  Filled 2022-02-25: qty 14
  Filled 2022-02-25 (×4): qty 1
  Filled 2022-02-25: qty 14
  Filled 2022-02-25 (×4): qty 1

## 2022-02-25 NOTE — Progress Notes (Signed)
[facility] LCSW Progress Note   02/25/2022    3:11 PM  Laura Thompson      Type of Note: Collateral Suicide Prevention Planning Call    CSW called patient cousin Laura Thompson, who was not much help. Laura Thompson went back to when patient was little and getting her hair done by her daughter and they use to sit down and talk about "things", but did not go into details. Laura Thompson then had to leave and said to call her back tomorrow since it was storming.     Signed:   Silas Flood, MSW, [facility] 02/25/2022 3:11 PM

## 2022-02-25 NOTE — Progress Notes (Signed)
   02/25/22 2230  Psych Admission Type (Psych Patients Only)  Admission Status Involuntary  Psychosocial Assessment  Patient Complaints None  Eye Contact Brief  Facial Expression Anxious  Affect Anxious  Speech Soft  Interaction Assertive  Motor Activity Restless  Appearance/Hygiene Unremarkable  Behavior Characteristics Anxious  Mood Anxious;Pleasant  Aggressive Behavior  Effect No apparent injury  Thought Process  Coherency WDL  Content WDL  Delusions WDL  Perception WDL  Hallucination None reported or observed  Judgment Poor  Confusion None  Danger to Self  Current suicidal ideation? Denies  Danger to Others  Danger to Others None reported or observed
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
## 2022-02-25 NOTE — BHH Group Notes (Signed)
Child/Adolescent Psychoeducational Group Note  Date:  02/25/2022 Time:  6:25 PM  Group Topic/Focus:  Goals Group:   The focus of this group is to help patients establish daily goals to achieve during treatment and discuss how the patient can incorporate goal setting into their daily lives to aide in recovery.  Participation Level:  Did Not Attend  Additional Comments:   Patient was told multiple times that group was starting, but patient did not attend.   Yardley Beltran T Ria Comment 02/25/2022, 6:25 PM

## 2022-02-25 NOTE — Group Note (Signed)
Recreation Therapy Group Note   Group Topic:Communication  Group Date: 02/25/2022 Start Time: 1000 End Time: 1035 Facilitators: Asiel Chrostowski-McCall, LRT,CTRS Location: [address] Dayroom   Goal Area(s) Addresses:  Patient will effectively listen to complete activity.  Patient will identify communication skills used to make activity successful.  Patient will identify how skills used during activity can be used to reach post d/c goals.    Group Description: Geometric Drawings.  Three volunteers from the peer group will be shown an abstract picture with a particular arrangement of geometrical shapes.  Each round, one 'speaker' will describe the pattern, as accurately as possible without revealing the image to the group.  The remaining group members will listen and draw the picture to reflect how it is described to them. Patients with the role of 'listener' cannot ask clarifying questions but, may request that the speaker repeat a direction. Once the drawings are complete, the presenter will show the rest of the group the picture and compare how close each person came to drawing the picture. LRT will facilitate a post-activity discussion regarding effective communication and the importance of planning, listening, and asking for clarification in daily interactions with others.   Affect/Mood: N/A   Participation Level: Did not attend    Clinical Observations/Individualized Feedback:     Plan: Continue to engage patient in RT group sessions 2-3x/week.   Lakenya Riendeau-McCall, LRT,CTRS 02/25/2022 11:25 AM

## 2022-02-25 NOTE — Plan of Care (Signed)
  Problem: Coping: Goal: Ability to demonstrate self-control will improve Outcome: Progressing   Problem: Health Behavior/Discharge Planning: Goal: Compliance with treatment plan for underlying cause of condition will improve Outcome: Progressing   Problem: Safety: Goal: Periods of time without injury will increase Outcome: Progressing   Problem: Education: Goal: Mental status will improve Outcome: Not Progressing Goal: Verbalization of understanding the information provided will improve Outcome: Not Progressing   Problem: Coping: Goal: Ability to verbalize frustrations and anger appropriately will improve Outcome: Not Progressing   Problem: Education: Goal: Emotional status will improve Outcome: Not Progressing

## 2022-02-25 NOTE — Progress Notes (Signed)
Adult Psychoeducational Group Note  Date:  02/25/2022 Time:  8:31 PM  Group Topic/Focus:  Wrap-Up Group:   The focus of this group is to help patients review their daily goal of treatment and discuss progress on daily workbooks.  Participation Level:  Active  Participation Quality:  Attentive  Affect:  Appropriate  Cognitive:  Appropriate  Insight: Appropriate  Engagement in Group:  Engaged  Modes of Intervention:  Discussion  Additional Comments:   Pt states she had a good day but was disappointed because she assumed she would be going home tomorrow. Pt is now set to D/C on Friday and is optimistic about it. Pt states that she has learn to stay in compliance with her treatment.   Gerhard Perches 02/25/2022, 8:31 PM

## 2022-02-25 NOTE — Inpatient Diabetes Management (Signed)
Inpatient Diabetes Program Recommendations  AACE/ADA: New Consensus Statement on Inpatient Glycemic Control (2015)  Target Ranges:  Prepandial:   less than 140 mg/dL      Peak postprandial:   less than 180 mg/dL (1-2 hours)      Critically ill patients:  140 - 180 mg/dL   Lab Results  Component Value Date   GLUCAP 205 (H) 11/08/2021   HGBA1C 8.0 (H) 02/23/2022    Review of Glycemic Control  Latest Reference Range & Units 02/23/22 18:29  Glucose 70 - 99 mg/dL 298 (H)    Latest Reference Range & Units 02/22/22 06:44  Hemoglobin A1C 4.8 - 5.6 % 8.1 (H)   Diabetes history: DM 2 diagnosed on 2020 Outpatient Diabetes medications: Trulicity 4.17 mg QSaturday Current orders for Inpatient glycemic control:  None   Inpatient Diabetes Program Recommendations:    -  Start Novolog 0-15 units tid + hs  Thanks,  Tama Headings RN, MSN, BC-ADM Inpatient Diabetes Coordinator Team Pager [phone] (8a-5p)

## 2022-02-25 NOTE — Progress Notes (Signed)
   02/25/22 1000  Psych Admission Type (Psych Patients Only)  Admission Status Involuntary  Psychosocial Assessment  Patient Complaints None  Eye Contact Brief  Facial Expression Anxious;Worried  Affect Anxious  Publishing rights manager Activity Restless  Appearance/Hygiene Unremarkable  Behavior Characteristics Anxious;Guarded;Irritable  Mood Anxious  Aggressive Behavior  Effect No apparent injury  Thought Process  Coherency WDL  Content WDL  Delusions WDL  Perception WDL  Hallucination None reported or observed  Judgment Poor  Confusion None  Danger to Self  Current suicidal ideation? Denies  Agreement Not to Harm Self Yes  Description of Agreement Verbal  Danger to Others  Danger to Others None reported or observed

## 2022-02-25 NOTE — Progress Notes (Signed)
   02/25/22 0602  15 Minute Checks  Location Bedroom  Visual Appearance Calm  Behavior Composed  Sleep (Behavioral Health Patients Only)  Calculate sleep? (Click Yes once per 24 hr at 0600 safety check) Yes  Documented sleep last 24 hours 9

## 2022-02-26 LAB — BASIC METABOLIC PANEL
Anion gap: 8 (ref 5–15)
BUN: 14 mg/dL (ref 6–20)
CO2: 27 mmol/L (ref 22–32)
Calcium: 9.1 mg/dL (ref 8.9–10.3)
Chloride: 96 mmol/L — ABNORMAL LOW (ref 98–111)
Creatinine, Ser: 0.54 mg/dL (ref 0.44–1.00)
GFR, Estimated: >60 mL/min (ref >60)
Glucose, Bld: 279 mg/dL — ABNORMAL HIGH (ref 70–99)
Potassium: 4.5 mmol/L (ref 3.5–5.1)
Sodium: 131 mmol/L — ABNORMAL LOW (ref 135–145)

## 2022-02-26 NOTE — Plan of Care (Signed)
  Problem: Education: Goal: Verbalization of understanding the information provided will improve Outcome: Progressing   Problem: Coping: Goal: Ability to demonstrate self-control will improve Outcome: Progressing   Problem: Health Behavior/Discharge Planning: Goal: Compliance with treatment plan for underlying cause of condition will improve Outcome: Progressing   

## 2022-02-26 NOTE — Progress Notes (Signed)
Adult Psychoeducational Group Note  Date:  02/26/2022 Time:  8:47 PM  Group Topic/Focus:  Wrap-Up Group:   The focus of this group is to help patients review their daily goal of treatment and discuss progress on daily workbooks.  Participation Level:  Active  Participation Quality:  Appropriate  Affect:  Appropriate  Cognitive:  Appropriate  Insight: Appropriate  Engagement in Group:  Engaged  Modes of Intervention:  Discussion  Additional Comments:   Pt states that she had a "fantastic" day. Pt states she talked to her mother and had a good visit with her husband and her daughter. Pt states she's been focused on planning for D/C on Friday. Pt denies everything.   Gerhard Perches 02/26/2022, 8:47 PM

## 2022-02-26 NOTE — Progress Notes (Signed)
   02/26/22 2015  Psych Admission Type (Psych Patients Only)  Admission Status Involuntary  Psychosocial Assessment  Patient Complaints None  Eye Contact Brief  Facial Expression Anxious  Affect Anxious  Speech Soft  Interaction Assertive  Motor Activity Restless  Appearance/Hygiene Unremarkable  Behavior Characteristics Anxious;Guarded  Mood Anxious;Pleasant  Aggressive Behavior  Effect No apparent injury  Thought Process  Coherency WDL  Content WDL  Delusions WDL  Perception WDL  Hallucination None reported or observed  Judgment Poor  Confusion None  Danger to Self  Current suicidal ideation? Denies  Danger to Others  Danger to Others None reported or observed

## 2022-02-26 NOTE — Group Note (Signed)
Recreation Therapy Group Note   Group Topic:Goal Setting  Group Date: 02/26/2022 Start Time: 1005 End Time: 1050 Facilitators: Ardeth Repetto-McCall, LRT,CTRS Location: [address] Dayroom   Goal Area(s) Addresses:  Patient will identify the importance of setting goals Patient will participate in discussion of what a goal is. Patient will successfully identify four goals to work towards.  Group Description: Goals.  LRT and patients discussed the importance of goals.  Patients were then given a worksheet in which they had to identify a goal they wanted to accomplish in a week, month, year and 5 years.  Patients then had to identify any obstacles they would face, what they needed to achieve goals and what they can start doing now to work towards goals.   Affect/Mood: Appropriate   Participation Level: Engaged   Participation Quality: Independent   Behavior: Appropriate   Speech/Thought Process: Focused   Insight: Good   Judgement: Good   Modes of Intervention: Worksheet   Patient Response to Interventions:  Engaged   Education Outcome:  Acknowledges education and In group clarification offered    Clinical Observations/Individualized Feedback: Pt expressed goals were important because "if you fail to plan, you plan to fail".   Pt stated she wants to go back to work in a week.  In a month, continue writing to obtain business.  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  Pt identified biggest obstacle was taking time for herself.  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  Lastly, pt stated she can start working towards her goal tomorrow by taking time for herself.     Plan: Continue to engage patient in RT group sessions 2-3x/week.   Zymier Rodgers-McCall, LRT,CTRS 02/26/2022 1:05 PM
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
## 2022-02-26 NOTE — Progress Notes (Signed)
   02/26/22 0900  Psych Admission Type (Psych Patients Only)  Admission Status Involuntary  Psychosocial Assessment  Patient Complaints None  Eye Contact Brief  Facial Expression Anxious  Affect Anxious  Speech Pressured  Interaction Assertive  Motor Activity Restless  Appearance/Hygiene Unremarkable  Behavior Characteristics Anxious;Guarded  Mood Anxious;Pleasant  Aggressive Behavior  Effect No apparent injury  Thought Process  Coherency WDL  Content WDL  Delusions WDL  Perception WDL  Hallucination None reported or observed  Judgment Poor  Confusion None  Danger to Self  Current suicidal ideation? Denies  Agreement Not to Harm Self Yes  Description of Agreement verbal  Danger to Others  Danger to Others None reported or observed

## 2022-02-26 NOTE — [facility] Group Notes (Signed)
Adult Psychoeducational Group Note  Date:  02/26/2022 Time:  2:54 PM  Group Topic/Focus:  Wellness Toolbox:   The focus of this group is to discuss various aspects of wellness, balancing those aspects and exploring ways to increase the ability to experience wellness.  Patients will create a wellness toolbox for use upon discharge.  Participation Level:  Active  Participation Quality:  Attentive  Affect:  Appropriate  Cognitive:  Alert  Insight: Appropriate  Engagement in Group:  Engaged  Modes of Intervention:  Activity  Additional Comments:  Patient attended and participated in the therapeutic group activity.  Annie Sable 02/26/2022, 2:54 PM

## 2022-02-26 NOTE — Progress Notes (Signed)
   02/26/22 0600  15 Minute Checks  Location Bathroom/Shower  Visual Appearance Calm  Behavior Composed  Sleep (Behavioral Health Patients Only)  Calculate sleep? (Click Yes once per 24 hr at 0600 safety check) Yes  Documented sleep last 24 hours 7.25

## 2022-02-26 NOTE — Group Note (Signed)
LCSW Group Therapy Note   Group Date: 02/26/2022 Start Time: 1300 End Time: 1400   Type of Therapy and Topic:  Group Therapy: Boundaries  Participation Level:  Did Not Attend  Description of Group: This group will address the use of boundaries in their personal lives. Patients will explore why boundaries are important, the difference between healthy and unhealthy boundaries, and negative and postive outcomes of different boundaries and will look at how boundaries can be crossed.  Patients will be encouraged to identify current boundaries in their own lives and identify what kind of boundary is being set. Facilitators will guide patients in utilizing problem-solving interventions to address and correct types boundaries being used and to address when no boundary is being used. Understanding and applying boundaries will be explored and addressed for obtaining and maintaining a balanced life. Patients will be encouraged to explore ways to assertively make their boundaries and needs known to significant others in their lives, using other group members and facilitator for role play, support, and feedback.  Therapeutic Goals:  1.  Patient will identify areas in their life where setting clear boundaries could be  used to improve their life.  2.  Patient will identify signs/triggers that a boundary is not being respected. 3.  Patient will identify two ways to set boundaries in order to achieve balance in  their lives: 4.  Patient will demonstrate ability to communicate their needs and set boundaries  through discussion and/or role plays  Summary of Patient Progress:  Did Not Attend     Therapeutic Modalities:   Cognitive Behavioral Therapy Solution-Focused Therapy  Laura Thompson, LCSWA 02/26/2022  2:47 PM    

## 2022-02-26 NOTE — Inpatient Diabetes Management (Signed)
Inpatient Diabetes Program Recommendations  AACE/ADA: New Consensus Statement on Inpatient Glycemic Control  Target Ranges:  Prepandial:   less than 140 mg/dL      Peak postprandial:   less than 180 mg/dL (1-2 hours)      Critically ill patients:  140 - 180 mg/dL    Latest Reference Range & Units 11/08/21 09:26  Glucose-Capillary 70 - 99 mg/dL 205 (H)    Latest Reference Range & Units 02/23/22 18:29 02/26/22 06:54  Glucose 70 - 99 mg/dL 298 (H) 279 (H)  Hemoglobin A1C 4.8 - 5.6 % 8.0 (H)    Review of Glycemic Control  Diabetes history: DM2 Outpatient Diabetes medications: Trulicity 6.62 mg Qweek Current orders for Inpatient glycemic control: None  Inpatient Diabetes Program Recommendations:    Inpatient DM medications: Please consider ordering Glipizide 10 mg daily, CBGs AC&HS, and Novolog 0-9 units AC&HS.  NOTE: Noted consult for diabetes coordinator. Chart reviewed. Noted patient has hx of DM2 and seen PCP on 08/15/21 for DM2. Per office note on 08/15/21, patient was noted to be taking Glipizide 10 mg daily and Trulicity 9.47 mg Qweek for DM. Patient is currently not ordered any DM medications as an inpatient.   Thanks, Barnie Alderman, RN, MSN, [place] Diabetes Coordinator Inpatient Diabetes Program [phone] (Team Pager from 8am to [place])

## 2022-02-27 LAB — GLUCOSE, CAPILLARY
Glucose-Capillary: 298 mg/dL — ABNORMAL HIGH (ref 70–99)
Glucose-Capillary: 167 mg/dL — ABNORMAL HIGH (ref 70–99)
Glucose-Capillary: 280 mg/dL — ABNORMAL HIGH (ref 70–99)

## 2022-02-27 MED ORDER — INSULIN ASPART 100 UNIT/ML IJ SOLN: 0.0000 [IU] | Freq: Three times a day (TID) | INTRAMUSCULAR | Status: DC

## 2022-02-27 MED ORDER — ONDANSETRON HCL 4 MG PO TABS
4.0000 mg | ORAL_TABLET | Freq: Once | ORAL | Status: DC
  Filled 2022-02-27: qty 1

## 2022-02-27 MED ORDER — GLIPIZIDE 5 MG PO TABS
10.0000 mg | ORAL_TABLET | Freq: Every day | ORAL | Status: DC
  Administered 2022-02-27 – 2022-02-28 (×2): 10 mg via ORAL
  Filled 2022-02-27 (×2): qty 1
  Filled 2022-02-27: qty 14
  Filled 2022-02-27: qty 1

## 2022-02-27 MED ORDER — ARIPIPRAZOLE ER 400 MG IM SRER
400.0000 mg | INTRAMUSCULAR | Status: DC
  Administered 2022-02-27: 400 mg via INTRAMUSCULAR

## 2022-02-27 NOTE — Progress Notes (Signed)
[place] Group Notes:  (Nursing/MHT/Case Management/Adjunct)  Date:  02/27/2022  Time:  2015  Type of Therapy:   wrap up group  Participation Level:  Active  Participation Quality:  Appropriate, Attentive, Sharing, and Supportive  Affect:  Not Congruent  Cognitive:  Alert  Insight:  Improving  Engagement in Group:  Engaged  Modes of Intervention:  Clarification, Education, and Support  Summary of Progress/Problems: Positive thinking and positive change were discussed.   Shellia Cleverly 02/27/2022, 9:22 PM
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
## 2022-02-27 NOTE — Progress Notes (Signed)
   02/27/22 0618  15 Minute Checks  Location Dayroom  Visual Appearance Calm  Behavior Composed  Sleep (Behavioral Health Patients Only)  Calculate sleep? (Click Yes once per 24 hr at 0600 safety check) Yes  Documented sleep last 24 hours 6

## 2022-02-27 NOTE — Group Note (Unsigned)
LCSW Group Therapy Note   Group Date: 02/27/2022 Start Time: 1100 End Time: 1200   Type of Therapy and Topic:  Group Therapy: Boundaries  Participation Level:  {[facility] PARTICIPATION LEVEL:22264}  Description of Group: This group will address the use of boundaries in their personal lives. Patients will explore why boundaries are important, the difference between healthy and unhealthy boundaries, and negative and postive outcomes of different boundaries and will look at how boundaries can be crossed.  Patients will be encouraged to identify current boundaries in their own lives and identify what kind of boundary is being set. Facilitators will guide patients in utilizing problem-solving interventions to address and correct types boundaries being used and to address when no boundary is being used. Understanding and applying boundaries will be explored and addressed for obtaining and maintaining a balanced life. Patients will be encouraged to explore ways to assertively make their boundaries and needs known to significant others in their lives, using other group members and facilitator for role play, support, and feedback.  Therapeutic Goals:  1.  Patient will identify areas in their life where setting clear boundaries could be  used to improve their life.  2.  Patient will identify signs/triggers that a boundary is not being respected. 3.  Patient will identify two ways to set boundaries in order to achieve balance in  their lives: 4.  Patient will demonstrate ability to communicate their needs and set boundaries  through discussion and/or role plays  Summary of Patient Progress:  *** was ***present/active throughout the session and proved open to feedback from CSW and peers. Patient demonstrated *** insight into the subject matter, was respectful of peers, and was present throughout the entire session.  Therapeutic Modalities:   Cognitive Behavioral Therapy Solution-Focused Therapy  Mister Krahenbuhl S  Yasuko Lapage, LCSWA 02/27/2022  1:22 PM    

## 2022-02-27 NOTE — Plan of Care (Signed)
  Problem: Safety: Goal: Periods of time without injury will increase Outcome: Progressing   Problem: Education: Goal: Emotional status will improve Outcome: Progressing   Problem: Coping: Goal: Ability to demonstrate self-control will improve Outcome: Progressing

## 2022-02-27 NOTE — Group Note (Signed)
Recreation Therapy Group Note   Group Topic:Team Building  Group Date: 02/27/2022 Start Time: 1007 End Time: 1030 Facilitators: Marriah Sanderlin-McCall, LRT,CTRS Location: [address] Dayroom   Goal Area(s) Addresses:  Patient will effectively work with peer towards shared goal.  Patient will identify skills used to make activity successful.  Patient will identify how skills used during activity can be applied to reach post d/c goals.   Group Description: [employer]. In teams of 5-6, patients were given 12 craft pipe cleaners. Using the materials provided, patients were instructed to compete again the opposing team(s) to build the tallest free-standing structure from floor level. The activity was timed; difficulty increased by Probation officer as Pharmacist, hospital continued.  Systematically resources were removed with additional directions for example, placing one arm behind their back, working in silence, and shape stipulations. LRT facilitated post-activity discussion reviewing team processes and necessary communication skills involved in completion. Patients were encouraged to reflect how the skills utilized, or not utilized, in this activity can be incorporated to positively impact support systems post discharge.   Affect/Mood: Appropriate   Participation Level: Engaged   Participation Quality: Independent   Behavior: Appropriate   Speech/Thought Process: Focused   Insight: Good   Judgement: Good   Modes of Intervention: STEM Activity   Patient Response to Interventions:  Engaged   Education Outcome:  Acknowledges education and In group clarification offered    Clinical Observations/Individualized Feedback: Pt was bright and engaged.  Pt was social with peer.  Pt worked on her tower alone but was into the activity and engaging with peer at the same time.  Pt was able to complete tower and get it the tallest compared to peer.    Plan: Continue to engage patient in RT  group sessions 2-3x/week.   Lizett Chowning-McCall, LRT,CTRS  02/27/2022 11:50 AM

## 2022-02-27 NOTE — Group Note (Signed)
LCSW Group Therapy Note   Group Date: 02/27/2022 Start Time: 1100 End Time: 1200  Type of Therapy: Group Therapy: Boundaries   Participation Level: Active   Description of Group: This group will address the use of boundaries in their personal lives. Patients will explore why boundaries are important, the difference between healthy and unhealthy boundaries, and negative and positive outcomes of different boundaries and will look at how boundaries can be crossed.  Patients will be encouraged to identify current boundaries in their own lives and identify what kind of boundary is being set. Facilitators will guide patients in utilizing problem-solving interventions to address and correct types of boundaries being used and to address when no boundary is being used. Understanding and applying boundaries will be explored and addressed for obtaining and maintaining a balanced life. Patients will be encouraged to explore ways to assertively make their boundaries and needs known to significant others in their lives, using other group members and facilitator for role play, support, and feedback.    Therapeutic Goals: 1. Patient will identify areas in their life where setting clear boundaries could be used to improve their life.  2. Patient will identify signs/triggers that a boundary is not being respected. 3. Patient will identify two ways to set boundaries in order to achieve balance in their lives: 4. Patient will demonstrate ability to communicate their needs and set boundaries through discussion and/or role plays   Summary of Progress/Problems: The Pt attended group and remained there the entire time.  The Pt accepted all worksheets and materials that were provided.  The Pt was appropriate with peers and staff.  The Pt was able to identify values that are important to them and ways they can begin to create boundaries around those values.   Darleen Crocker, LCSWA 02/27/2022  1:09 PM

## 2022-02-27 NOTE — BHH Suicide Risk Assessment (Signed)
[name] INPATIENT:  Family/Significant Other Suicide Prevention Education  Suicide Prevention Education:  Education Completed; Laura Thompson (cousin) [phone] ,  (name of family member/significant other) has been identified by the patient as the family member/significant other with whom the patient will be residing, and identified as the person(s) who will aid the patient in the event of a mental health crisis (suicidal ideations/suicide attempt).  With written consent from the patient, the family member/significant other has been provided the following suicide prevention education, prior to the and/or following the discharge of the patient.  The suicide prevention education provided includes the following: Suicide risk factors Suicide prevention and interventions National Suicide Hotline telephone number [facility] assessment telephone number [facility] Emergency Assistance Laura Thompson and/or Residential Mobile Crisis Unit telephone number  Request made of family/significant other to: Remove weapons (e.g., guns, rifles, knives), all items previously/currently identified as safety concern.   Remove drugs/medications (over-the-counter, prescriptions, illicit drugs), all items previously/currently identified as a safety concern.  The family member/significant other verbalizes understanding of the suicide prevention education information provided.  The family member/significant other agrees to remove the items of safety concern listed above.  CSW spoke with patient friend Laura Thompson to complete safety planning. Laura Thompson explained her relationship with patient, patient grew up around her and her two children. Laura Thompson states that patient was always visiting her to vent and talk about what she was going through; " she is honest whether she is right or wrong and always wanted me to pick a side but I do not do that, I give advice and allow for her to make the decision that she wants". Laura Thompson  then states that when patient was in college her parents got a divorce and it was hard for her , later on her dad passed, and then one thing led to another, she never reached out for professional help to get through these hurdles just allowed for everything to build up". However, Laura Thompson mentioned that patient husband and daughter are both supportive, she helped raised patient daughter but believes that patient needs to seek counseling rather just taking medications. 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   Laura Thompson 02/27/2022, 2:40 PM

## 2022-02-27 NOTE — Progress Notes (Signed)
Patient compliant with Abilify injection but refused her insulin x2 stating that she is leaving tomorrow and she does not want to get started on insulin since she normally does not take that at home. Patient compliant with glipizide. Patient denies SI,HI, and A/V/H with no plan/intent. Patient cooperative on unit. Current BS @1725 : 167.

## 2022-02-27 NOTE — BHH Group Notes (Signed)
Adult Psychoeducational Group Note  Date:  02/27/2022 Time:  6:09 PM  Group Topic/Focus:  Managing Feelings:   The focus of this group is to identify what feelings patients have difficulty handling and develop a plan to handle them in a healthier way upon discharge.  Participation Level:  Active  Participation Quality:  Appropriate  Affect:  Defensive  Cognitive:  Alert  Insight: Appropriate  Engagement in Group:  Engaged  Modes of Intervention:  Discussion  Additional Comments:  Pt attended group but made it known that she was not pleased with her treatment here at [facility] and feels that she needs to advocate for other patients.  Zae Kirtz R Moishe Schellenberg 02/27/2022, 6:09 PM

## 2022-02-28 LAB — GLUCOSE, CAPILLARY: Glucose-Capillary: 255 mg/dL — ABNORMAL HIGH (ref 70–99)

## 2022-02-28 MED ORDER — ARIPIPRAZOLE ER 400 MG IM SRER: 400.0000 mg | INTRAMUSCULAR | 0 refills | Status: DC

## 2022-02-28 MED ORDER — GLIPIZIDE 10 MG PO TABS: 10.0000 mg | ORAL_TABLET | Freq: Every day | ORAL | 0 refills | Status: DC

## 2022-02-28 MED ORDER — ARIPIPRAZOLE 10 MG PO TABS: 10.0000 mg | ORAL_TABLET | Freq: Every day | ORAL | 0 refills | Status: DC

## 2022-02-28 MED ORDER — TRAZODONE HCL 50 MG PO TABS: 50.0000 mg | ORAL_TABLET | Freq: Every evening | ORAL | 0 refills | Status: DC | PRN

## 2022-02-28 MED ORDER — LAMOTRIGINE 25 MG PO TABS: 25.0000 mg | ORAL_TABLET | Freq: Two times a day (BID) | ORAL | 0 refills | Status: DC

## 2022-02-28 NOTE — Discharge Summary (Signed)
Physician Discharge Summary Note  Patient:  Laura Thompson is an 47 y.o., female MRN:  [id number] DOB:  11-26-1975 Patient phone:  [phone] (home)  Patient address:   [address] [place] [address],  Total Time spent with patient: 45 minutes  Date of Admission:  02/21/2022 Date of Discharge: 02/28/2022  Reason for Admission:  Laura Thompson is a 48 year old female with a psychiatric history of bipolar 1 disorder and insomnia who presented to [facility] involuntarily from [facility] for concerns for manic behavior.     Principal Problem: Bipolar 1 disorder, manic, moderate (HCC) Discharge Diagnoses: Principal Problem:   Bipolar 1 disorder, manic, moderate (HCC)   Past Psychiatric History:  Previous Psych Diagnoses: Bipolar 1 disorder Prior inpatient treatment:last in August 2022 for mania under IVC, at least total of four admission per chart review  Psychotherapy hx: Denies History of suicide: Reported spinning a revolver and putting it to her head at the age of 28 or 51 History of homicide: Denies Psychiatric medication history: Abilify, lithium 450 mg, Risperdal 2 mg, Depakote, Zyprexa, trazodone Psychiatric medication compliance history: Reported noncompliance Neuromodulation history: Denies Current Psychiatrist: Referred to contact both the psychiatry and counseling on 9/19.  Last documented video visit with Neysa Hotter, MD on 05/11/2021 Current therapist: Denies  Past Medical History:  Past Medical History:  Diagnosis Date   Bipolar 1 disorder (HCC)    Diabetes mellitus without complication (HCC)     Past Surgical History:  Procedure Laterality Date   CERVICAL ABLATION     DENTAL SURGERY     lipoma removal      Family History:  Family History  Problem Relation Age of Onset   Thyroid disease Mother    Hypertension Father    Diabetes Father    Glaucoma Father    Family Psychiatric  History:  Psych: Denies Psych Rx: Denies SA/HA: Denies Substance use  family hx: Denies Social History:  Social History   Substance and Sexual Activity  Alcohol Use No     Social History   Substance and Sexual Activity  Drug Use No    Social History   Socioeconomic History   Marital status: Married    Spouse name: Not on file   Number of children: Not on file   Years of education: Not on file   Highest education level: Not on file  Occupational History   Not on file  Tobacco Use   Smoking status: Never   Smokeless tobacco: Never  Vaping Use   Vaping Use: Never used  Substance and Sexual Activity   Alcohol use: No   Drug use: No   Sexual activity: Not on file  Other Topics Concern   Not on file  Social History Narrative   Not on file   Social Determinants of Health   Financial Resource Strain: Not on file  Food Insecurity: No Food Insecurity (02/21/2022)   Hunger Vital Sign    Worried About Running Out of Food in the Last Year: Never true    Ran Out of Food in the Last Year: Never true  Transportation Needs: No Transportation Needs (02/21/2022)   PRAPARE - Administrator, Civil Service (Medical): No    Lack of Transportation (Non-Medical): No  Physical Activity: Not on file  Stress: Not on file  Social Connections: Not on file   Refer to H&P  Substance Use History:  Refer to H&P  Hospital Course:   During the patient's hospitalization, patient had extensive initial  psychiatric evaluation, and follow-up psychiatric evaluations every day.   Psychiatric diagnoses provided upon initial assessment:  Bipolar 1 disorder, current episode manic    Patient's psychiatric medications were adjusted on admission: Patient primarily was started on Seroquel 50 mg at bedtime for mood stabilization and insomnia as well as Lamictal 25 mg daily for mood stabilization   During the hospitalization, other adjustments were made to the patient's psychiatric medication regimen: Seroquel was discontinued after patient refused it, Abilify was  started for mood stabilization with patient's agreement, Abilify was titrated from 5 to 10 mg daily and then patient was given first Abilify Maintena injection 400 mg during hospital stay to receive injection monthly and to continue Abilify orally for 2 weeks after discharge.  Lamictal was titrated from 25 mg daily to 25 mg twice daily for mood stabilization, crystal was continued 10 mg daily for hyperlipidemia, glipizide 10 mg daily was started after diabetes management consult for management of diabetes with recommendation for patient to follow-up with primary care provider after discharge.  During hospital stay patient refused supplemental insulin coverage for her diabetes.  During hospital stay patient received trazodone 50 mg at bedtime as needed for sleep once and did not require or need any Atarax as needed for anxiety.   Patient's care was discussed during the interdisciplinary team meeting every day during the hospitalization.   The patient denied having side effects to prescribed psychiatric medication.   Gradually, patient started adjusting to milieu. The patient was evaluated each day by a clinical provider to ascertain response to treatment. Improvement was noted by the patient's report of decreasing symptoms, improved sleep and appetite, affect, medication tolerance, behavior, and participation in unit programming.  Patient was asked each day to complete a self inventory noting mood, mental status, pain, new symptoms, anxiety and concerns.     Symptoms were reported as significantly decreased or resolved completely by discharge.    On day of discharge, patient was evaluated on 02/28/2022 the patient reports that their mood is stable. The patient denied having suicidal thoughts for more than 48 hours prior to discharge.  Patient denies having homicidal thoughts.  Patient denies having auditory hallucinations.  Patient denies any visual hallucinations or other symptoms of psychosis. The patient  was motivated to continue taking medication with a goal of continued improvement in mental health.    The patient reports their target psychiatric symptoms of mood instability, racing thoughts, mania and grandiosity responded well to the psychiatric medications, and the patient reports overall benefit other psychiatric hospitalization. Supportive psychotherapy was provided to the patient. The patient also participated in regular group therapy while hospitalized. Coping skills, problem solving as well as relaxation therapies were also part of the unit programming.   Labs were reviewed with the patient, and abnormal results were discussed with the patient.   The patient is able to verbalize their individual safety plan to this provider.   Behavioral Events: None   Restraints: None   Groups: Attended and participated   Medications Changes: As above   D/C Medications: Abilify 10 mg daily for mood stabilization, Abilify maintainer 400 mg IM monthly next injection on 2/8, glipizide 10 mg daily for diabetes, Lamictal 25 mg twice daily for mood stabilization, Crestor 10 mg daily for hyperlipidemia, trazodone 50 mg at bedtime as needed for sleep.   Sleep  Sleep: Fair, improved during hospital stay except for reported slight interrupted sleep mainly related to having uncomfortable bed in the hospital.    Physical Findings:  AIMS: Facial and Oral Movements Muscles of Facial Expression: None, normal Lips and Perioral Area: None, normal Jaw: None, normal Tongue: None, normal,Extremity Movements Upper (arms, wrists, hands, fingers): None, normal Lower (legs, knees, ankles, toes): None, normal, Trunk Movements Neck, shoulders, hips: None, normal, Overall Severity Severity of abnormal movements (highest score from questions above): None, normal Incapacitation due to abnormal movements: None, normal Patient's awareness of abnormal movements (rate only patient's report): No Awareness, Dental  Status Current problems with teeth and/or dentures?: No Does patient usually wear dentures?: No  CIWA:    COWS:     Musculoskeletal: Strength & Muscle Tone: within normal limits Gait & Station: normal Patient leans: N/A   Psychiatric Specialty Exam:  General Appearance: appears at stated age, fairly dressed and groomed  Behavior: pleasant and cooperative  Psychomotor Activity:No psychomotor agitation or retardation noted   Eye Contact: good Speech: normal amount, tone, volume and latency   Mood: euthymic Affect: congruent, pleasant and interactive  Thought Process: linear, goal directed, no circumstantial or tangential thought process noted, no racing thoughts or flight of ideas Descriptions of Associations: intact Thought Content: Hallucinations: denies AH, VH , does not appear responding to stimuli Delusions: No paranoia or other delusions noted Suicidal Thoughts: denies SI, intention, plan  Homicidal Thoughts: denies HI, intention, plan   Alertness/Orientation: alert and fully oriented  Insight: fair, improved Judgment: fair, improved  Memory: intact  Executive Functions  Concentration: intact  Attention Span: Fair Recall: intact Fund of Knowledge: fair   Assets  Assets: Armed forces logistics/support/administrative officer; Desire for Improvement; Resilience     Physical Exam:  Physical Exam Vitals and nursing note reviewed.  Constitutional:      Appearance: Normal appearance.  HENT:     Head: Normocephalic and atraumatic.     Nose: Nose normal.  Eyes:     Pupils: Pupils are equal, round, and reactive to light.  Pulmonary:     Effort: Pulmonary effort is normal.  Musculoskeletal:        General: Normal range of motion.     Cervical back: Normal range of motion.  Neurological:     General: No focal deficit present.     Mental Status: She is alert and oriented to person, place, and time.  Psychiatric:        Mood and Affect: Mood normal.        Behavior: Behavior normal.         Thought Content: Thought content normal.        Judgment: Judgment normal.    Review of Systems  All other systems reviewed and are negative.  Blood pressure 104/87, pulse (!) 128, temperature 98.5 F (36.9 C), temperature source Oral, resp. rate 16, height 5\' 3"  (1.6 m), weight 74.8 kg, SpO2 99 %. Body mass index is 29.23 kg/m.   Social History   Tobacco Use  Smoking Status Never  Smokeless Tobacco Never   Tobacco Cessation:  N/A, patient does not currently use tobacco products   Blood Alcohol level:  Lab Results  Component Value Date   ETH <10 11/07/2021   ETH <10 42/59/5638    Metabolic Disorder Labs:  Lab Results  Component Value Date   HGBA1C 8.0 (H) 02/23/2022   MPG 183 02/23/2022   MPG 186 02/22/2022   No results found for: "PROLACTIN" Lab Results  Component Value Date   CHOL 224 (H) 02/22/2022   TRIG 22 02/22/2022   HDL 83 02/22/2022   CHOLHDL 2.7 02/22/2022   VLDL 4  02/22/2022   LDLCALC 137 (H) 02/22/2022   LDLCALC 153 (H) 03/12/2017    See Psychiatric Specialty Exam and Suicide Risk Assessment completed by Attending Physician prior to discharge.  Discharge destination:  Home  Is patient on multiple antipsychotic therapies at discharge:  No   Has Patient had three or more failed trials of antipsychotic monotherapy by history:  No  Recommended Plan for Multiple Antipsychotic Therapies: NA  Discharge Instructions     Diet - low sodium heart healthy   Complete by: As directed    Increase activity slowly   Complete by: As directed       Allergies as of 02/28/2022       Reactions   Diphenhydramine Hives   Doxycycline Hives   Iodinated Contrast Media Hives   Sulfa Antibiotics Hives   Iodine Hives   Septra [sulfamethoxazole-trimethoprim] Hives   Metformin And Related Diarrhea        Medication List     STOP taking these medications    cyclobenzaprine 10 MG tablet Commonly known as: FLEXERIL   fluticasone 50 MCG/ACT nasal  spray Commonly known as: FLONASE   Ibuprofen 200 MG Caps   methocarbamol 500 MG tablet Commonly known as: ROBAXIN   ODORLESS GARLIC PO   Trulicity 0.75 MG/0.5ML Sopn Generic drug: Dulaglutide   VITAMIN A PO   VITAMIN C PO   VITAMIN D3 PO   zinc gluconate 50 MG tablet   zolpidem 10 MG tablet Commonly known as: AMBIEN       TAKE these medications      Indication  ARIPiprazole 10 MG tablet Commonly known as: ABILIFY Take 1 tablet (10 mg total) by mouth daily.  Indication: Manic Phase of Manic-Depression   ARIPiprazole ER 400 MG Srer injection Commonly known as: ABILIFY MAINTENA Inject 2 mLs (400 mg total) into the muscle every 28 (twenty-eight) days. Start taking on: March 27, 2022  Indication: Manic-Depression   FISH OIL PO Take 1 capsule by mouth daily.  Indication: High Amount of Fats in the Blood   glipiZIDE 10 MG tablet Commonly known as: GLUCOTROL Take 1 tablet (10 mg total) by mouth daily before breakfast.  Indication: Type 2 Diabetes   lamoTRIgine 25 MG tablet Commonly known as: LAMICTAL Take 1 tablet (25 mg total) by mouth 2 (two) times daily.  Indication: Manic-Depression   rosuvastatin 10 MG tablet Commonly known as: CRESTOR Take 10 mg by mouth daily.  Indication: High Amount of Fats in the Blood   traZODone 50 MG tablet Commonly known as: DESYREL Take 1 tablet (50 mg total) by mouth at bedtime as needed for sleep.  Indication: Trouble Sleeping        Follow-up Information     [facility]. Go on 03/07/2022.   Why: You have a hospital follow up appointment for group therapy and medication management services on 03/07/22 at 1:00 pm.    This appointment will be held in person. Contact information: [address] [place] [address] [phone]         [place] Go to.   Specialty: Professional Counselor Why: You may go to this provider for individual therapy services during walk in  hours for new patients:  Monday through Friday from 9 am to 1 pm. Contact information: [address] [place] [address] [phone]                 Discharge recommendations:    Activity: as tolerated  Diet: heart healthy  # It is recommended to the patient to continue psychiatric medications as prescribed, after discharge from the hospital.     # It is recommended to the patient to follow up with your outpatient psychiatric provider and PCP.   # It was discussed with the patient, the impact of alcohol, drugs, tobacco have been there overall psychiatric and medical wellbeing, and total abstinence from substance use was recommended the patient.ed.   # Prescriptions provided or sent directly to preferred pharmacy at discharge. Patient agreeable to plan. Given opportunity to ask questions. Appears to feel comfortable with discharge.    # In the event of worsening symptoms, the patient is instructed to call the crisis hotline, 911 and or go to the nearest ED for appropriate evaluation and treatment of symptoms. To follow-up with primary care provider for other medical issues, concerns and or health care needs   # Patient was discharged home with a plan to follow up as noted above.  -Follow-up with outpatient primary care doctor and other specialists -for management of chronic medical disease, including: Patient was recommended to follow-up with primary care provider after discharge for management of diabetes and hyperlipidemia, she has an appointment on 1/24, she agreed to comply.   Patient agrees with D/C instructions and plan.   The patient received suicide prevention pamphlet:  Yes Belongings returned:  Clothing and Valuables  Total Time Spent in Direct Patient Care:  I personally spent 45 minutes on the unit in direct patient care. The direct patient care time included face-to-face time with the patient, reviewing the patient's chart, communicating with  other professionals, and coordinating care. Greater than 50% of this time was spent in counseling or coordinating care with the patient regarding goals of hospitalization, psycho-education, and discharge planning needs.    SignedSarita Bottom, MD 02/28/2022, 9:52 AM

## 2022-02-28 NOTE — B[facility] Group Notes (Signed)
Adult Psychoeducational Group Note  Date:  02/28/2022 Time:  11:04 AM  Group Topic/Focus:  Goals Group:   The focus of this group is to help patients establish daily goals to achieve during treatment and discuss how the patient can incorporate goal setting into their daily lives to aide in recovery.  Participation Level:  Active  Participation Quality:  Appropriate  Affect:  Appropriate  Cognitive:  Appropriate  Insight: Appropriate  Engagement in Group:  Engaged  Modes of Intervention:  Discussion  Additional Comments:  Patient goal of the day is to go home.   [name] R [name] 02/28/2022, 11:04 AM

## 2022-02-28 NOTE — Plan of Care (Signed)
  Problem: Safety: Goal: Periods of time without injury will increase Outcome: Progressing   Problem: Activity: Goal: Interest or engagement in activities will improve Outcome: Progressing Goal: Sleeping patterns will improve Outcome: Progressing

## 2022-02-28 NOTE — BHH Suicide Risk Assessment (Signed)
[facility] Discharge Suicide Risk Assessment   Principal Problem: Bipolar 1 disorder, manic, moderate (HCC) Discharge Diagnoses: Principal Problem:   Bipolar 1 disorder, manic, moderate (HCC)   Total Time spent with patient: 45 minutes  Reason for admission: Laura Thompson is a 47 year old female with a psychiatric history of bipolar 1 disorder and insomnia who presented to [facility] involuntarily from [facility] for concerns for manic behavior.    PTA Medications:  Medications Prior to Admission  Medication Sig Dispense Refill Last Dose   Ascorbic Acid (VITAMIN C PO) Take 1 tablet by mouth daily. (Patient not taking: Reported on 02/20/2022)         Cholecalciferol (VITAMIN D3 PO) Take 1 capsule by mouth daily. (Patient not taking: Reported on 02/20/2022)         cyclobenzaprine (FLEXERIL) 10 MG tablet Take one tab PO TID PRN muscle spasm (Patient not taking: Reported on 11/07/2021) 20 tablet 0     fluticasone (FLONASE) 50 MCG/ACT nasal spray Place 1-2 sprays into both nostrils daily. (Patient not taking: Reported on 02/20/2022)         Ibuprofen 200 MG CAPS Take 200-400 mg by mouth every 6 (six) hours as needed (for mild pain or headaches). (Patient not taking: Reported on 02/20/2022)         methocarbamol (ROBAXIN) 500 MG tablet Take 500 mg by mouth 4 (four) times daily. (Patient not taking: Reported on 02/20/2022)         ODORLESS GARLIC PO Take 1 capsule by mouth daily. (Patient not taking: Reported on 02/20/2022)         Omega-3 Fatty Acids (FISH OIL PO) Take 1 capsule by mouth daily. (Patient not taking: Reported on 02/20/2022)         rosuvastatin (CRESTOR) 10 MG tablet Take 10 mg by mouth daily. (Patient not taking: Reported on 02/20/2022)         TRULICITY 0.75 MG/0.5ML SOPN Inject 0.75 mg into the skin every Saturday. (Patient not taking: Reported on 02/20/2022)         VITAMIN A PO Take 1 tablet by mouth daily. (Patient not taking: Reported on 02/20/2022)         zinc gluconate 50 MG tablet Take 50 mg by mouth  daily. (Patient not taking: Reported on 02/20/2022)         zolpidem (AMBIEN) 10 MG tablet Take 10 mg by mouth at bedtime as needed for sleep. (Patient not taking: Reported on 02/20/2022)           Hospital Course:   During the patient's hospitalization, patient had extensive initial psychiatric evaluation, and follow-up psychiatric evaluations every day.  Psychiatric diagnoses provided upon initial assessment:  Bipolar 1 disorder, current episode manic   Patient's psychiatric medications were adjusted on admission: Patient primarily was started on Seroquel 50 mg at bedtime for mood stabilization and insomnia as well as Lamictal 25 mg daily for mood stabilization  During the hospitalization, other adjustments were made to the patient's psychiatric medication regimen: Seroquel was discontinued after patient refused it, Abilify was started for mood stabilization with patient's agreement, Abilify was titrated from 5 to 10 mg daily and then patient was given first Abilify Maintena injection 400 mg during hospital stay to receive injection monthly and to continue Abilify orally for 2 weeks after discharge.  Lamictal was titrated from 25 mg daily to 25 mg twice daily for mood stabilization, crystal was continued 10 mg daily for hyperlipidemia, glipizide 10 mg daily was started after diabetes management  consult for management of diabetes with recommendation for patient to follow-up with primary care provider after discharge.  During hospital stay patient refused supplemental insulin coverage for her diabetes.  During hospital stay patient received trazodone 50 mg at bedtime as needed for sleep once and did not require or need any Atarax as needed for anxiety.  Patient's care was discussed during the interdisciplinary team meeting every day during the hospitalization.  The patient denied having side effects to prescribed psychiatric medication.  Gradually, patient started adjusting to milieu. The patient was  evaluated each day by a clinical provider to ascertain response to treatment. Improvement was noted by the patient's report of decreasing symptoms, improved sleep and appetite, affect, medication tolerance, behavior, and participation in unit programming.  Patient was asked each day to complete a self inventory noting mood, mental status, pain, new symptoms, anxiety and concerns.    Symptoms were reported as significantly decreased or resolved completely by discharge.   On day of discharge, patient was evaluated on 02/28/2022 the patient reports that their mood is stable. The patient denied having suicidal thoughts for more than 48 hours prior to discharge.  Patient denies having homicidal thoughts.  Patient denies having auditory hallucinations.  Patient denies any visual hallucinations or other symptoms of psychosis. The patient was motivated to continue taking medication with a goal of continued improvement in mental health.   The patient reports their target psychiatric symptoms of mood instability, racing thoughts, mania and grandiosity responded well to the psychiatric medications, and the patient reports overall benefit other psychiatric hospitalization. Supportive psychotherapy was provided to the patient. The patient also participated in regular group therapy while hospitalized. Coping skills, problem solving as well as relaxation therapies were also part of the unit programming.  Labs were reviewed with the patient, and abnormal results were discussed with the patient.  The patient is able to verbalize their individual safety plan to this provider.  Behavioral Events: None  Restraints: None  Groups: Attended and participated  Medications Changes: As above  D/C Medications: Abilify 10 mg daily for mood stabilization, Abilify maintainer 400 mg IM monthly next injection on 2/8, glipizide 10 mg daily for diabetes, Lamictal 25 mg twice daily for mood stabilization, Crestor 10 mg daily for  hyperlipidemia, trazodone 50 mg at bedtime as needed for sleep.  Sleep  Sleep: Fair, improved during hospital stay except for reported slight interrupted sleep mainly related to having uncomfortable bed in the hospital.  Musculoskeletal: Strength & Muscle Tone: within normal limits Gait & Station: normal Patient leans: N/A  Psychiatric Specialty Exam  General Appearance: appears at stated age, fairly dressed and groomed  Behavior: pleasant and cooperative  Psychomotor Activity:No psychomotor agitation or retardation noted   Eye Contact: good Speech: normal amount, tone, volume and latency   Mood: euthymic Affect: congruent, pleasant and interactive  Thought Process: linear, goal directed, no circumstantial or tangential thought process noted, no racing thoughts or flight of ideas Descriptions of Associations: intact Thought Content: Hallucinations: denies AH, VH , does not appear responding to stimuli Delusions: No paranoia or other delusions noted Suicidal Thoughts: denies SI, intention, plan  Homicidal Thoughts: denies HI, intention, plan   Alertness/Orientation: alert and fully oriented  Insight: fair, improved Judgment: fair, improved  Memory: intact  Executive Functions  Concentration: intact  Attention Span: Fair Recall: intact Fund of Knowledge: fair   Art therapist  Concentration: intact Attention Span: Fair Recall: intact Fund of Knowledge: fair   Assets  Assets: Manufacturing systems engineer;  Desire for Improvement; Resilience   Physical Exam: Physical Exam ROS Blood pressure 104/87, pulse (!) 128, temperature 98.5 F (36.9 C), temperature source Oral, resp. rate 16, height 5\' 3"  (1.6 m), weight 74.8 kg, SpO2 99 %. Body mass index is 29.23 kg/m.  Mental Status Per Nursing Assessment::   On Admission:  NA  Demographic Factors:  NA  Loss Factors: NA  Historical Factors: Impulsivity  Risk Reduction Factors:   Sense of responsibility to  family, Living with another person, especially a relative, Positive social support, Positive therapeutic relationship, and Positive coping skills or problem solving skills  Continued Clinical Symptoms: Symptoms of mania improved significantly during hospital stay Bipolar Disorder:   Mixed State  Cognitive Features That Contribute To Risk:  None    Suicide Risk:  Minimal: No identifiable suicidal ideation.  Patients presenting with no risk factors but with morbid ruminations; may be classified as minimal risk based on the severity of the depressive symptoms   Follow-up Information     [facility] [place]. Go on 03/07/2022.   Why: You have a hospital follow up appointment for group therapy and medication management services on 03/07/22 at 1:00 pm.    This appointment will be held in person. Contact information: [address] [place] [address] [phone]         [place] Go to.   Specialty: Professional Counselor Why: You may go to this provider for individual therapy services during walk in hours for new patients:  Monday through Friday from 9 am to 1 pm. Contact information: [place] [address] [phone]                 Plan Of Care/Follow-up recommendations:    Discharge recommendations:    Activity: as tolerated  Diet: heart healthy  # It is recommended to the patient to continue psychiatric medications as prescribed, after discharge from the hospital.     # It is recommended to the patient to follow up with your outpatient psychiatric provider and PCP.   # It was discussed with the patient, the impact of alcohol, drugs, tobacco have been there overall psychiatric and medical wellbeing, and total abstinence from substance use was recommended the patient.ed.   # Prescriptions provided or sent directly to preferred pharmacy at discharge. Patient agreeable to plan. Given opportunity to ask  questions. Appears to feel comfortable with discharge.    # In the event of worsening symptoms, the patient is instructed to call the crisis hotline, 911 and or go to the nearest ED for appropriate evaluation and treatment of symptoms. To follow-up with primary care provider for other medical issues, concerns and or health care needs   # Patient was discharged home with a plan to follow up as noted above.  -Follow-up with outpatient primary care doctor and other specialists -for management of chronic medical disease, including: Patient was recommended to follow-up with primary care provider for management of diabetes and hyperlipidemia after discharge, she agreed.   Patient agrees with D/C instructions and plan.  The patient received suicide prevention pamphlet:  Yes Belongings returned:  Clothing and Valuables  Total Time Spent in Direct Patient Care:  I personally spent 45 minutes on the unit in direct patient care. The direct patient care time included face-to-face time with the patient, reviewing the patient's chart, communicating with other professionals, and coordinating care. Greater than 50% of this time was spent in counseling or coordinating  care with the patient regarding goals of hospitalization, psycho-education, and discharge planning needs.   Rilyn Scroggs 02/28/2022, 9:46 AM   Jailen Lung Winfred Leeds, MD 02/28/2022, 9:46 AM

## 2022-02-28 NOTE — Progress Notes (Signed)
D/c Instructions-Education: Laura Thompson. Discharge instructions given to patient, and she verbalized understanding of all D/C information given to her.. D/c education completed with patient including follow up instructions, medication list, d/c activities limitations if indicated, with other d/c instructions as indicated by MD - patient able to verbalize understanding, all questions fully answered. Patient denied SI/HI/AVH. Suicide prevention information given and discussed with patient and understanding of D/C information verbalized by patient/family. All questions asked were answered by staff, patient stated they have no questions at time of D/C. Patient received all belongings brought to the unit at discharge. Patient showed appreciation of care given to him at the [facility] unit. Patient was escorted to the lobby and left for home in private auto at 1120.    Laura Carnes, RN

## 2022-02-28 NOTE — Progress Notes (Signed)
  [facility] Adult Case Management Discharge Plan :  Will you be returning to the same living situation after discharge:  Yes,  Home At discharge, do you have transportation home?: Yes,  Daughter is picking patient up  Do you have the ability to pay for your medications: Yes,  Patient was given GoodRx discount cards   Release of information consent forms completed and in the chart;  Patient's signature needed at discharge.  Patient to Follow up at:  Follow-up Information     [facility] [place]. Go on 03/07/2022.   Why: You have a hospital follow up appointment for group therapy and medication management services on 03/07/22 at 1:00 pm.    This appointment will be held in person. Contact information: [address] [place] [address] [phone]         [place] Go to.   Specialty: Professional Counselor Why: You may go to this provider for individual therapy services during walk in hours for new patients:  Monday through Friday from 9 am to 1 pm. Contact information: [place] [address] [phone]                 Next level of care provider has access to [place] and Suicide Prevention discussed: Yes,  Dierdre Forth (cousin) [phone]       Has patient been referred to the Quitline?: N/A patient is not a smoker  Patient has been referred for addiction treatment: N/A  Sherre Lain, LCSWA 02/28/2022, 10:27 AM

## 2022-02-28 NOTE — Progress Notes (Signed)
   02/28/22 0100  Psych Admission Type (Psych Patients Only)  Admission Status Involuntary  Psychosocial Assessment  Patient Complaints None  Eye Contact Fair  Facial Expression Animated  Affect Appropriate to circumstance  Speech Logical/coherent  Interaction Assertive  Motor Activity Slow  Appearance/Hygiene Unremarkable  Behavior Characteristics Appropriate to situation;Cooperative;Calm  Mood Pleasant  Thought Process  Coherency WDL  Content WDL  Delusions None reported or observed  Perception WDL  Hallucination None reported or observed  Judgment Poor  Confusion None  Danger to Self  Current suicidal ideation? Denies  Agreement Not to Harm Self Yes  Description of Agreement verbal  Danger to Others  Danger to Others None reported or observed

## 2022-03-10 DIAGNOSIS — E1169 Type 2 diabetes mellitus with other specified complication: Secondary | ICD-10-CM | POA: Diagnosis not present

## 2022-03-10 DIAGNOSIS — I152 Hypertension secondary to endocrine disorders: Secondary | ICD-10-CM | POA: Diagnosis not present

## 2022-03-10 DIAGNOSIS — E785 Hyperlipidemia, unspecified: Secondary | ICD-10-CM | POA: Diagnosis not present

## 2022-03-10 DIAGNOSIS — E119 Type 2 diabetes mellitus without complications: Secondary | ICD-10-CM | POA: Diagnosis not present

## 2022-03-10 DIAGNOSIS — F3110 Bipolar disorder, current episode manic without psychotic features, unspecified: Secondary | ICD-10-CM | POA: Diagnosis not present

## 2022-03-24 DIAGNOSIS — Z124 Encounter for screening for malignant neoplasm of cervix: Secondary | ICD-10-CM | POA: Diagnosis not present

## 2022-03-24 DIAGNOSIS — Z1151 Encounter for screening for human papillomavirus (HPV): Secondary | ICD-10-CM | POA: Diagnosis not present

## 2022-03-24 DIAGNOSIS — Z1211 Encounter for screening for malignant neoplasm of colon: Secondary | ICD-10-CM | POA: Diagnosis not present

## 2022-03-24 DIAGNOSIS — Z01419 Encounter for gynecological examination (general) (routine) without abnormal findings: Secondary | ICD-10-CM | POA: Diagnosis not present

## 2022-04-03 ENCOUNTER — Ambulatory Visit
Admission: EM | Admit: 2022-04-03 | Discharge: 2022-04-03 | Disposition: A | Discharge status: Home / Self Care | Payer: BC Managed Care – PPO

## 2022-04-03 DIAGNOSIS — R059 Cough, unspecified: Secondary | ICD-10-CM

## 2022-04-03 DIAGNOSIS — J029 Acute pharyngitis, unspecified: Secondary | ICD-10-CM | POA: Diagnosis not present

## 2022-04-03 DIAGNOSIS — U071 COVID-19: Secondary | ICD-10-CM | POA: Diagnosis not present

## 2022-04-03 LAB — POCT INFLUENZA A/B
Influenza A, POC: NEGATIVE
Influenza B, POC: NEGATIVE

## 2022-04-03 MED ORDER — FLUTICASONE PROPIONATE 50 MCG/ACT NA SUSP: 1.0000 | Freq: Every day | NASAL | 0 refills | Status: DC

## 2022-04-03 NOTE — Discharge Instructions (Addendum)
Your flu test is negative. Your symptoms are consistent with COVID-19. There is an antiviral treatment available, however as we discussed you do not meet treatment criteria. Therefore, stay at home for a total of 5 days. Rest. Increase your water intake. OTC oscillococcinum can help with fatigue and body aches. I have called in a nasal spray to help with your nasal congestion. If you develop high fevers, severe shortness of breath or chest pain, please head to the ER.

## 2022-04-03 NOTE — ED Triage Notes (Signed)
Pt presents to Urgent Care with c/o sore throat and cough x "a couple days." Reports positive home COVID test.

## 2022-04-03 NOTE — ED Provider Notes (Signed)
[facility]    CSN: [id number] Arrival date & time: 04/03/22  1814      History   Chief Complaint Chief Complaint  Patient presents with   Cough   Sore Throat   Covid Positive    HPI Laura Thompson is a 47 y.o. female.   Pleasant 47 year old female presents today due to concern of a rhinorrhea, sore throat, cough, and bodyaches.  Patient states the sore throat cough and nasal congestion started 1 to 2 days ago.  She woke up today with the body aches.  She denies a fever.  She states that her husband had symptoms last week.  Her husband works at the post office and had 12 possible COVID exposures.  Patient took a home COVID test today, which was positive.  Patient is a Education officer, museum with kindergarten students, and is concerned that she may also have the flu given her body aches.  She did get the flu vaccine this year.  She has not taken any over-the-counter medications for symptoms.   Cough Sore Throat    Past Medical History:  Diagnosis Date   Bipolar 1 disorder ([place])    Diabetes mellitus without complication [facility])     Patient Active Problem List   Diagnosis Date Noted   Bipolar 1 disorder, manic, moderate ([place]) 02/21/2022   Diabetes mellitus without complication ([place]) [id number]   Bipolar disorder, current episode manic severe with psychotic features ([place]) 03/10/2017   Bipolar affective disorder, current episode manic ([place]) 03/10/2017    Past Surgical History:  Procedure Laterality Date   CERVICAL ABLATION     DENTAL SURGERY     lipoma removal      OB History   No obstetric history on file.      Home Medications    Prior to Admission medications   Medication Sig Start Date End Date Taking? Authorizing Provider  dextromethorphan-guaiFENesin (MUCINEX DM) 30-600 MG 12hr tablet Take 1 tablet by mouth 2 (two) times daily.   Yes [provider]  fluticasone (FLONASE) 50 MCG/ACT nasal spray Place 1 spray into both nostrils daily. 04/03/22   Yes Caileb Rhue L, PA  Omega-3 Fatty Acids (FISH OIL PO) Take 1 capsule by mouth daily. Patient not taking: Reported on 02/20/2022    [provider]    Family History Family History  Problem Relation Age of Onset   Thyroid disease Mother    Hypertension Father    Diabetes Father    Glaucoma Father     Social History Social History   Tobacco Use   Smoking status: Never   Smokeless tobacco: Never  Vaping Use   Vaping Use: Never used  Substance Use Topics   Alcohol use: No   Drug use: No     Allergies   Diphenhydramine, Doxycycline, Iodinated contrast media, Sulfa antibiotics, Iodine, Septra [sulfamethoxazole-trimethoprim], and Metformin and related   Review of Systems Review of Systems  Respiratory:  Positive for cough.   As per HPI   Physical Exam Triage Vital Signs ED Triage Vitals  Enc Vitals Group     BP 04/03/22 1835 126/87     Pulse Rate 04/03/22 1835 94     Resp 04/03/22 1835 20     Temp 04/03/22 1835 98 F (36.7 C)     Temp Source 04/03/22 1835 Oral     SpO2 04/03/22 1835 100 %     Weight 04/03/22 1828 170 lb (77.1 kg)     Height 04/03/22 1828 5' 3"$  (  1.6 m)     Head Circumference --      Peak Flow --      Pain Score 04/03/22 1827 0     Pain Loc --      Pain Edu? --      Excl. in [place]? --    No data found.  Updated Vital Signs BP 126/87 (BP Location: Right Arm)   Pulse 94   Temp 98 F (36.7 C) (Oral)   Resp 20   Ht 5' 3"$  (1.6 m)   Wt 170 lb (77.1 kg)   LMP 03/03/2022 (Approximate) Comment: "It would be a miracle if I was pregnant, but I'm not on anything and I'm married."  SpO2 100%   BMI 30.11 kg/m   Visual Acuity Right Eye Distance:   Left Eye Distance:   Bilateral Distance:    Right Eye Near:   Left Eye Near:    Bilateral Near:     Physical Exam Vitals and nursing note reviewed.  Constitutional:      General: She is not in acute distress.    Appearance: She is well-developed. She is obese. She is ill-appearing.  She is not toxic-appearing or diaphoretic.  HENT:     Head: Normocephalic and atraumatic.     Right Ear: Tympanic membrane, ear canal and external ear normal. No drainage, swelling or tenderness. No middle ear effusion. There is no impacted cerumen. Tympanic membrane is not erythematous.     Left Ear: Tympanic membrane, ear canal and external ear normal. No drainage, swelling or tenderness.  No middle ear effusion. There is no impacted cerumen. Tympanic membrane is not erythematous.     Nose: Congestion and rhinorrhea present. Rhinorrhea is clear.     Right Turbinates: Enlarged and swollen.     Left Turbinates: Enlarged and swollen.     Right Sinus: No maxillary sinus tenderness or frontal sinus tenderness.     Left Sinus: No maxillary sinus tenderness or frontal sinus tenderness.     Mouth/Throat:     Lips: Pink.     Mouth: Mucous membranes are moist.     Pharynx: Oropharynx is clear. Uvula midline. No pharyngeal swelling, oropharyngeal exudate, posterior oropharyngeal erythema or uvula swelling.     Tonsils: No tonsillar exudate or tonsillar abscesses.  Eyes:     General: No scleral icterus.       Right eye: No discharge.        Left eye: No discharge.     Extraocular Movements: Extraocular movements intact.     Conjunctiva/sclera: Conjunctivae normal.     Pupils: Pupils are equal, round, and reactive to light.  Cardiovascular:     Rate and Rhythm: Normal rate and regular rhythm.     Pulses: Normal pulses.     Heart sounds: Normal heart sounds. No murmur heard.    No gallop.  Pulmonary:     Effort: Pulmonary effort is normal. No respiratory distress.     Breath sounds: Normal breath sounds. No stridor. No wheezing (L sided posterior lung fields only), rhonchi or rales.  Chest:     Chest wall: No tenderness.  Abdominal:     Palpations: Abdomen is soft.     Tenderness: There is no abdominal tenderness.  Musculoskeletal:        General: No swelling or tenderness. Normal range of  motion.     Cervical back: Normal range of motion and neck supple. No tenderness.     Right lower leg: No edema.  Left lower leg: No edema.  Lymphadenopathy:     Cervical: No cervical adenopathy.  Skin:    General: Skin is warm and dry.     Capillary Refill: Capillary refill takes less than 2 seconds.  Neurological:     Mental Status: She is alert.  Psychiatric:        Mood and Affect: Mood normal.      UC Treatments / Results  Labs (all labs ordered are listed, but only abnormal results are displayed) Labs Reviewed  POCT INFLUENZA A/B    EKG   Radiology No results found.  Procedures Procedures (including critical care time)  Medications Ordered in UC Medications - No data to display  Initial Impression / Assessment and Plan / UC Course  I have reviewed the triage vital signs and the nursing notes.  Pertinent labs & imaging results that were available during my care of the patient were reviewed by me and considered in my medical decision making (see chart for details).     Covid 19 -flu test is negative.  Symptoms are consistent with COVID-19.  Patient is on day 2-3 of symptoms.  She denies DM, hypertension, hyperlipidemia, or pulmonary issues.  We did discuss Paxlovid, patient opts to treat with over-the-counter medications and avoid antiviral treatment at this time.  I feel that this is appropriate.  ER precautions discussed.  Patient to be out of work until next week. Will start flonase to help with nasal congestion.   Final Clinical Impressions(s) / UC Diagnoses   Final diagnoses:  [id number]     Discharge Instructions      Your flu test is negative. Your symptoms are consistent with COVID-19. There is an antiviral treatment available, however as we discussed you do not meet treatment criteria. Therefore, stay at home for a total of 5 days. Rest. Increase your water intake. OTC oscillococcinum can help with fatigue and body aches. I have called in a  nasal spray to help with your nasal congestion. If you develop high fevers, severe shortness of breath or chest pain, please head to the ER.     ED Prescriptions     Medication Sig Dispense Auth. Provider   fluticasone (FLONASE) 50 MCG/ACT nasal spray Place 1 spray into both nostrils daily. 16 mL Yalissa Fink L, PA      PDMP not reviewed this encounter.   Chaney Malling, [place] 04/03/22 1926

## 2022-05-23 DIAGNOSIS — Z1231 Encounter for screening mammogram for malignant neoplasm of breast: Secondary | ICD-10-CM | POA: Diagnosis not present

## 2022-09-15 DIAGNOSIS — E1169 Type 2 diabetes mellitus with other specified complication: Secondary | ICD-10-CM | POA: Diagnosis not present

## 2022-09-15 DIAGNOSIS — R5383 Other fatigue: Secondary | ICD-10-CM | POA: Diagnosis not present

## 2022-09-15 DIAGNOSIS — E119 Type 2 diabetes mellitus without complications: Secondary | ICD-10-CM | POA: Diagnosis not present

## 2022-09-15 DIAGNOSIS — F5101 Primary insomnia: Secondary | ICD-10-CM | POA: Diagnosis not present

## 2022-09-15 DIAGNOSIS — E785 Hyperlipidemia, unspecified: Secondary | ICD-10-CM | POA: Diagnosis not present

## 2022-09-15 DIAGNOSIS — F3110 Bipolar disorder, current episode manic without psychotic features, unspecified: Secondary | ICD-10-CM | POA: Diagnosis not present

## 2022-09-15 LAB — COMPREHENSIVE METABOLIC PANEL: Albumin: 6.9 — AB (ref 3.5–5.0)

## 2022-09-25 DIAGNOSIS — F3110 Bipolar disorder, current episode manic without psychotic features, unspecified: Secondary | ICD-10-CM | POA: Diagnosis not present

## 2022-09-25 DIAGNOSIS — E1165 Type 2 diabetes mellitus with hyperglycemia: Secondary | ICD-10-CM | POA: Diagnosis not present

## 2022-09-25 DIAGNOSIS — F5101 Primary insomnia: Secondary | ICD-10-CM | POA: Diagnosis not present

## 2022-11-07 ENCOUNTER — Ambulatory Visit (HOSPITAL_COMMUNITY): Payer: BC Managed Care – PPO

## 2022-11-07 ENCOUNTER — Ambulatory Visit: Payer: BC Managed Care – PPO

## 2022-11-07 DIAGNOSIS — J208 Acute bronchitis due to other specified organisms: Secondary | ICD-10-CM | POA: Diagnosis not present

## 2022-11-07 DIAGNOSIS — R051 Acute cough: Secondary | ICD-10-CM | POA: Diagnosis not present

## 2022-12-02 ENCOUNTER — Ambulatory Visit: Payer: BC Managed Care – PPO | Admitting: Family Medicine

## 2022-12-02 ENCOUNTER — Encounter: Payer: Self-pay | Admitting: Family Medicine

## 2022-12-02 VITALS — BP 102/72 | HR 82 | Temp 98.2°F | Ht 63.0 in | Wt 164.3 lb

## 2022-12-02 DIAGNOSIS — Z7985 Long-term (current) use of injectable non-insulin antidiabetic drugs: Secondary | ICD-10-CM

## 2022-12-02 DIAGNOSIS — G4709 Other insomnia: Secondary | ICD-10-CM

## 2022-12-02 DIAGNOSIS — E119 Type 2 diabetes mellitus without complications: Secondary | ICD-10-CM

## 2022-12-02 DIAGNOSIS — Z1211 Encounter for screening for malignant neoplasm of colon: Secondary | ICD-10-CM

## 2022-12-02 LAB — POCT GLYCOSYLATED HEMOGLOBIN (HGB A1C): Hemoglobin A1C: 7 % — AB (ref 4.0–5.6)

## 2022-12-02 MED ORDER — AMITRIPTYLINE HCL 10 MG PO TABS: 10.0000 mg | ORAL_TABLET | Freq: Every day | ORAL | 3 refills | Status: DC

## 2022-12-02 MED ORDER — TRULICITY 0.75 MG/0.5ML ~~LOC~~ SOAJ: 0.7500 mg | SUBCUTANEOUS | 5 refills | Status: DC

## 2022-12-02 NOTE — Patient Instructions (Signed)
Send me message in about 1 month to let me know how the insomnia is.
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
## 2022-12-15 DIAGNOSIS — G4709 Other insomnia: Secondary | ICD-10-CM | POA: Insufficient documentation

## 2022-12-15 NOTE — Assessment & Plan Note (Signed)
We discussed that [place] long term can increase the risk of dependency and dementia, I recommended we try amitriptyline at bedtime for her insomnia.

## 2022-12-15 NOTE — Assessment & Plan Note (Signed)
A1C is actually well controlled, at goal today with her dietary control, I will refill her trulicity today and she will also continue her glucotrol at the 10 mg daily dose.

## 2022-12-17 DIAGNOSIS — N951 Menopausal and female climacteric states: Secondary | ICD-10-CM | POA: Diagnosis not present

## 2022-12-17 DIAGNOSIS — Z7689 Persons encountering health services in other specified circumstances: Secondary | ICD-10-CM | POA: Diagnosis not present

## 2023-02-15 ENCOUNTER — Emergency Department (HOSPITAL_COMMUNITY)
Admission: EM | Admit: 2023-02-15 | Discharge: 2023-02-16 | Disposition: A | Discharge status: Other Acute Inpatient Hospital | Payer: BC Managed Care – PPO | Attending: Emergency Medicine | Admitting: Emergency Medicine

## 2023-02-15 ENCOUNTER — Other Ambulatory Visit: Payer: Self-pay

## 2023-02-15 ENCOUNTER — Encounter (HOSPITAL_COMMUNITY): Payer: Self-pay

## 2023-02-15 DIAGNOSIS — F29 Unspecified psychosis not due to a substance or known physiological condition: Secondary | ICD-10-CM | POA: Diagnosis not present

## 2023-02-15 DIAGNOSIS — F3110 Bipolar disorder, current episode manic without psychotic features, unspecified: Secondary | ICD-10-CM | POA: Diagnosis present

## 2023-02-15 DIAGNOSIS — B977 Papillomavirus as the cause of diseases classified elsewhere: Secondary | ICD-10-CM | POA: Insufficient documentation

## 2023-02-15 DIAGNOSIS — F312 Bipolar disorder, current episode manic severe with psychotic features: Secondary | ICD-10-CM | POA: Diagnosis present

## 2023-02-15 DIAGNOSIS — E119 Type 2 diabetes mellitus without complications: Secondary | ICD-10-CM | POA: Diagnosis not present

## 2023-02-15 LAB — CBC
HCT: 37.8 % (ref 36.0–46.0)
Hemoglobin: 12.9 g/dL (ref 12.0–15.0)
MCH: 28.9 pg (ref 26.0–34.0)
MCHC: 34.1 g/dL (ref 30.0–36.0)
MCV: 84.8 fL (ref 80.0–100.0)
Platelets: 288 10*3/uL (ref 150–400)
RBC: 4.46 MIL/uL (ref 3.87–5.11)
RDW: 12.9 % (ref 11.5–15.5)
WBC: 6.5 10*3/uL (ref 4.0–10.5)
nRBC: 0 % (ref 0.0–0.2)

## 2023-02-15 LAB — COMPREHENSIVE METABOLIC PANEL
ALT: 18 U/L (ref 0–44)
AST: 20 U/L (ref 15–41)
Albumin: 4.3 g/dL (ref 3.5–5.0)
Alkaline Phosphatase: 46 U/L (ref 38–126)
Anion gap: 9 (ref 5–15)
BUN: 15 mg/dL (ref 6–20)
CO2: 21 mmol/L — ABNORMAL LOW (ref 22–32)
Calcium: 8.8 mg/dL — ABNORMAL LOW (ref 8.9–10.3)
Chloride: 103 mmol/L (ref 98–111)
Creatinine, Ser: 0.66 mg/dL (ref 0.44–1.00)
GFR, Estimated: >60 mL/min (ref >60)
Glucose, Bld: 196 mg/dL — ABNORMAL HIGH (ref 70–99)
Potassium: 3.3 mmol/L — ABNORMAL LOW (ref 3.5–5.1)
Sodium: 133 mmol/L — ABNORMAL LOW (ref 135–145)
Total Bilirubin: 1.4 mg/dL — ABNORMAL HIGH (ref <1.2)
Total Protein: 7.6 g/dL (ref 6.5–8.1)

## 2023-02-15 LAB — ETHANOL: Alcohol, Ethyl (B): <10 mg/dL (ref <10)

## 2023-02-15 LAB — ACETAMINOPHEN LEVEL: Acetaminophen (Tylenol), Serum: <10 ug/mL — ABNORMAL LOW (ref 10–30)

## 2023-02-15 LAB — SALICYLATE LEVEL: Salicylate Lvl: <7 mg/dL — ABNORMAL LOW (ref 7.0–30.0)

## 2023-02-15 MED ORDER — RISPERIDONE 2 MG PO TABS
2.0000 mg | ORAL_TABLET | Freq: Every day | ORAL | Status: DC
  Administered 2023-02-15: 2 mg via ORAL
  Filled 2023-02-15: qty 1

## 2023-02-15 MED ORDER — DIVALPROEX SODIUM ER 500 MG PO TB24
500.0000 mg | ORAL_TABLET | Freq: Every day | ORAL | Status: DC
  Administered 2023-02-15: 500 mg via ORAL
  Filled 2023-02-15: qty 1

## 2023-02-15 MED ORDER — LORAZEPAM 2 MG/ML IJ SOLN
2.0000 mg | Freq: Once | INTRAMUSCULAR | Status: AC
  Administered 2023-02-15: 2 mg via INTRAMUSCULAR
  Filled 2023-02-15: qty 1

## 2023-02-15 MED ORDER — HALOPERIDOL LACTATE 5 MG/ML IJ SOLN
2.5000 mg | Freq: Once | INTRAMUSCULAR | Status: AC
  Administered 2023-02-15: 2.5 mg via INTRAMUSCULAR
  Filled 2023-02-15: qty 1

## 2023-02-15 NOTE — ED Notes (Signed)
Pt refuses to dress out in purple scrubs pt not IVC.

## 2023-02-15 NOTE — Progress Notes (Signed)
Patient has been denied by [facility] due to no appropriate beds available. Patient meets BH inpatient criteria per Dahlia Byes, NP. Patient has been faxed out to the following facilities:    [facility] [address] [place] [place] [address] [phone]  [facility] [address] [place] [address] [phone]  [facility] [address] [place] [address] [phone]  [facility] [address], [place] [address] [phone]  [facility] Patient Placement [facility], [place] [phone]  [facility] [address] [place] [address] [phone]  [facility] EFAX [address] [place], [place] [phone]  [facility] [address], [place] [address] [phone]  [facility] [address] [place] [address] [phone]  [facility] [address] [place] [address] [phone]  [facility] [address] [place] [address] [phone]  [facility] [address] [place] [address] [phone]  [facility] [place] [address] [phone]  [facility] [address], [place] [address] [phone]  [facility] [address] [place], [place] [address] [phone]  [facility] [address] [place] [address] [phone]  [facility] [address], [place] [address] [phone]  [facility] [address],  [place] [address] [phone]   Damita Dunnings, MSW, LCSW-A  5:44 PM 02/15/2023

## 2023-02-15 NOTE — ED Notes (Addendum)
Pt is awake and requesting to make a phone call. Staff assisted her with that. Asked to provide urine sample.

## 2023-02-15 NOTE — ED Notes (Signed)
Patient sleeping in bed after med injections
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
## 2023-02-15 NOTE — ED Notes (Signed)
Patient resting in bed - calm and cooperative

## 2023-02-15 NOTE — Consult Note (Cosign Needed Addendum)
[facility] Psychiatric Consult Initial  Patient Name: .Laura Thompson  MRN: [id number]  DOB: 1975/04/09  Consult Order details:  Orders (From admission, onward)     Start     Ordered   02/15/23 1311  CONSULT TO CALL ACT TEAM       Ordering Provider: Glyn Ade, MD  Provider:  (Not yet assigned)  Question:  Reason for Consult?  Answer:  Psychosis   02/15/23 1311             Mode of Visit: In person    Psychiatry Consult Evaluation  Service Date: February 15, 2023 LOS:  LOS: 0 days  Chief Complaint Manic, dancing, Paranoid  Primary Psychiatric Diagnoses  Bipolar Affective disorder, manic Severe with Psychotic features.  Assessment  Laura Thompson is a 47 y.o. female admitted: Presented to the EDfor 02/15/2023 12:28 PM for Manic symptoms, Paranoia and AH. She carries the psychiatric diagnoses of Bipolar disorder, Manic and has a past medical history of  DM.   Her current presentation of Manic symptoms  is most consistent with Bipolar disorder. She meets criteria for Inpatient Psychiatry hospitalization based on Medication non compliance,no outpatient Psychiatrist and she is a danger to self and others due to cognitive impairment..  Current outpatient psychotropic medications include NONE.   and historically she has had  no response to these medications. She was not compliant with medications prior to admission as evidenced by report from spouse and her Manic symptoms. On initial examination, patient was erratic, dancing in the room and not paying attention to anybody around her.. Please see plan below for detailed recommendations.   Diagnoses:  Active Hospital problems: Principal Problem:   Bipolar disorder, current episode manic severe with psychotic features (HCC)    Plan   ## Psychiatric Medication Recommendations:  Risperidone 2 mg po at bed time for Psychosis/Manic Symptoms Depakote  500 mg ER 24 HRS orally at bed time. Amitriptyline 10 mg po at bed time  for sleep. ## Medical Decision Making Capacity:  Patient is her own legal guardian and can make her own decision when stable.  ## Further Work-up:   U/A or UDS, EKG -- most recent EKG Last January 2024.  We will obtain one today -- Pertinent labwork reviewed earlier this admission includes: CMP, CBC, Ha1c, Tylenol level, Alcohol level   ## Disposition:-- We recommend inpatient psychiatric hospitalization after medical hospitalization. Patient has been involuntarily committed on 02/15/2023.   ## Behavioral / Environmental: -Recommend using specific terminology regarding PNES, i.e. call the episodes "non-epileptic seizures" rather than "pseudoseizures" as the latter insinuates "fake" or "feigned" symptoms, when the events are a very real experience to the patient and are a physical, non-volitional, manifestation of fear, pain and anxiety.  or To minimize splitting of staff, assign one staff person to communicate all information from the team when feasible.    ## Safety and Observation Level:  - Based on my clinical evaluation, I estimate the patient to be at low risk of self harm in the current setting. - At this time, we recommend  routine. This decision is based on my review of the chart including patient's history and current presentation, interview of the patient, mental status examination, and consideration of suicide risk including evaluating suicidal ideation, plan, intent, suicidal or self-harm behaviors, risk factors, and protective factors. This judgment is based on our ability to directly address suicide risk, implement suicide prevention strategies, and develop a safety plan while the patient is in the clinical setting. Please  contact our team if there is a concern that risk level has changed.  CSSR Risk Category:C-SSRS RISK CATEGORY: No Risk  Suicide Risk Assessment: Patient has following modifiable risk factors for suicide: untreated depression, under treated depression , medication  noncompliance, and lack of access to outpatient mental health resources, which we are addressing by recommending inpatient Psychiatry hospitalization. Patient has following non-modifiable or demographic risk factors for suicide: psychiatric hospitalization Patient has the following protective factors against suicide: Supportive family, Cultural, spiritual, or religious beliefs that discourage suicide, Minor children in the home, and no history of suicide attempts  Thank you for this consult request. Recommendations have been communicated to the primary team.  We will seek bed placement. at this time.   Earney Navy, NP-PMHNP-BC       History of Present Illness  Relevant Aspects of Hospital ED Course:  Admitted on 02/15/2023 for Manic Symptoms brought in by her husband for erratic behavior, paranoia and Hallucination.  Patient was not able to engage in any meaningful conversation with provider.    Husband, Mr Rhonna Drugan brought the patient to the ER and  reports that  patient ever since she left [facility] last January has not been taking her Medications and  she also has no outpatient Psychiatrist.  He reports that things started changing three weeks ago when patient was worried that people are out to get her and she requested change of her phone.  Husband said he allowed her change phone.,.  But after changing phone she remains Paranoid that people are are still monitoring her through the new phone.  Lately she has been talking to herself and to unseen people.  For three to four day she has not slept more than two hours even after he gave her Ambien. Mr Felipa Eth reports that patient started dancing, singing and that recently she was driving with him in the car and was dancing and singing not paying attention.  He got concerned yesterday and stayed home with her to monitor her.  He said today thing got out of control  in the house and he brought her to be seen.  The EDP IVC patient due to his  findings.  Spouse denies any alcohol or drug use.  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  From the time she was discharged till now no Medication taken and she has not seen a Psychiatry provider. Patient Meets criteria for inpatient Psychiatry hospitalization.  We will seek placement at any facility with available beds.  Psych ROS:  Depression: NA Anxiety:  YES Mania (lifetime and current): YES Psychosis: (lifetime and current): YES  Collateral information:  Copy  at home  on 02/15/2023.  See information above from him.  Review of Systems  Constitutional: Negative.   HENT: Negative.    Eyes: Negative.   Respiratory: Negative.    Cardiovascular: Negative.   Gastrointestinal: Negative.   Genitourinary: Negative.   Musculoskeletal: Negative.   Skin: Negative.   Neurological: Negative.   Endo/Heme/Allergies: Negative.   Psychiatric/Behavioral:  The patient is nervous/anxious and has insomnia.      Psychiatric and Social History  Psychiatric History:  Information collected from Courtland Terrence/Spouse  Prev Dx/Sx: Bipolar Affective disorder Current Psych Provider: no Home Meds (current): none Previous Med Trials: Abilify, lithium 450 mg, Risperdal 2 mg, Depakote, Zyprexa, trazodone  Therapy: none  Prior Psych Hospitalization: [facility], January 2024  Prior Self  Harm: no per Husband Prior Violence: no per husband  Family Psych History: denies by husband Family Hx suicide: denies by husband  Social History:  Developmental Hx: Normal Educational Hx: PhD , Psychology Occupational Hx: Psychologist, forensic Hx: Denies Living Situation: Lives at home with family-husband and daughter Spiritual Hx: yes,  Access to weapons/lethal means: no   Substance History Alcohol: no  Type of alcohol na Last Drink na Number of drinks per day na History of alcohol withdrawal seizures na History  of DT's na Tobacco: na Illicit drugs: na Prescription drug abuse: na Rehab hx: na  Exam Findings  Physical Exam: Vital Signs:  Temp:  [98.5 F (36.9 C)] 98.5 F (36.9 C) (12/29 1231) Pulse Rate:  [96] 96 (12/29 1231) Resp:  [16] 16 (12/29 1231) BP: (126)/(80) 126/80 (12/29 1231) SpO2:  [97 %] 97 % (12/29 1231) Weight:  [77.1 kg] 77.1 kg (12/29 1235) Blood pressure 126/80, pulse 96, temperature 98.5 F (36.9 C), temperature source Oral, resp. rate 16, height 5\' 3"  (1.6 m), weight 77.1 kg, SpO2 97%. Body mass index is 30.11 kg/m.  Physical Exam Vitals and nursing note reviewed.  Constitutional:      Appearance: Normal appearance.  HENT:     Nose: Nose normal.  Pulmonary:     Effort: Pulmonary effort is normal.  Skin:    General: Skin is dry.  Neurological:     Mental Status: She is alert and oriented to person, place, and time.  Psychiatric:        Attention and Perception: Perception normal. She is inattentive.        Mood and Affect: Mood is anxious and elated.        Speech: Speech is rapid and pressured and tangential.        Behavior: Behavior is uncooperative and hyperactive.        Thought Content: Thought content is paranoid and delusional.     Mental Status Exam: General Appearance: Casual and Neat  Orientation:  Other:  oriented to self and name  Memory:   Unable to obtain, patient is Manic , dancing singing  Concentration:  Concentration: Poor and Attention Span: Poor  Recall:   unable to engage, dancing, laughing  Attention  Poor  Eye Contact:  Minimal  Speech:   dancing, singing  Language:  Fair  Volume:  Normal  Mood: Manic, laughing, dancing  Affect:  Congruent and hyperactive, dancing, happy  Thought Process:  Disorganized and Irrelevant  Thought Content:  Illogical, Delusions, Paranoid Ideation, and inappropriate smiling  Suicidal Thoughts:   unable to obtain, unable to engage, Manic  Homicidal Thoughts:   unable to obtain, inattentive, dancing   Judgement:  Impaired  Insight:  Lacking  Psychomotor Activity:  Increased, Restlessness, and dancing  Akathisia:  NA  Fund of Knowledge:  Fair      Assets:  Biomedical engineer Intimacy Physical Health Talents/Skills Vocational/Educational  Cognition:  Impaired,  Moderate  ADL's:  Intact  AIMS (if indicated):        Other History   These have been pulled in through the EMR, reviewed, and updated if appropriate.  Family History:  The patient's family history includes Diabetes in her father; Glaucoma in her father; Hypertension in her father; Thyroid disease in her mother.  Medical History: Past Medical History:  Diagnosis Date   Bipolar 1 disorder (HCC)    Diabetes mellitus without complication (HCC)     Surgical History: Past Surgical History:  Procedure Laterality Date  CERVICAL ABLATION     DENTAL SURGERY     lipoma removal       Medications:   Current Facility-Administered Medications:    divalproex (DEPAKOTE ER) 24 hr tablet 500 mg, 500 mg, Oral, QHS, Leida Luton C, NP   risperiDONE (RISPERDAL) tablet 2 mg, 2 mg, Oral, QHS, Payson Evrard C, NP  Current Outpatient Medications:    amitriptyline (ELAVIL) 10 MG tablet, Take 1 tablet (10 mg total) by mouth at bedtime., Disp: 30 tablet, Rfl: 3   Ascorbic Acid (VITAMIN C PO), Take 500 mg by mouth daily., Disp: , Rfl:    cholecalciferol (VITAMIN D3) 25 MCG (1000 UNIT) tablet, Take 1,000 Units by mouth daily., Disp: , Rfl:    Cyanocobalamin (B-12 PO), Take 2,500 mg by mouth daily., Disp: , Rfl:    glipiZIDE (GLUCOTROL) 10 MG tablet, Take 10 mg by mouth daily., Disp: , Rfl:    OVER THE COUNTER MEDICATION, SSS Tonic, Disp: , Rfl:    rosuvastatin (CRESTOR) 10 MG tablet, Take 10 mg by mouth every other day., Disp: , Rfl:    TRULICITY 0.75 MG/0.5ML SOPN, Inject 0.75 mg into the skin once a week., Disp: 2 mL, Rfl: 5   zinc gluconate 50 MG tablet, Take 50 mg by mouth daily., Disp: , Rfl:     zolpidem (AMBIEN) 10 MG tablet, Take 10 mg by mouth at bedtime as needed., Disp: , Rfl:   Allergies: Allergies  Allergen Reactions   Aripiprazole Other (See Comments)    Manic-like Sx happened in March 2019   Diphenhydramine Hives   Doxycycline Hives   Iodinated Contrast Media Hives   Iodine Hives   Sulfa Antibiotics Hives   Septra [Sulfamethoxazole-Trimethoprim] Hives   Metformin And Related Diarrhea    Earney Navy, NP-PMHNP-BC

## 2023-02-15 NOTE — ED Triage Notes (Signed)
Pt BIB her husband and he states pt has not been taking her medications, not sleeping, and is very manic. Her reports pt is "dancing while driving," erratic behavior. Husband states that 3 weeks prior pt started becoming paranoid of electronic devices. Pt denies SI/HI, hallucinations. Pt's husband states he noted her responding to internal stimuli at times.

## 2023-02-15 NOTE — ED Notes (Signed)
Pt is awake and cooperative. Requesting medicine to help her sleep. Education provided regarding current meds given.

## 2023-02-15 NOTE — ED Notes (Signed)
Pt aware she is IVC'd and needs to change. Pt refused, stating she has to go to the bathroom. She goes to the bathroom and refused to come out. Writer went into bathroom and was able to talk pt out to stretcher. Pt continues to refuse to change clothes, Medications ordered by Dr Doran Durand.

## 2023-02-15 NOTE — ED Notes (Signed)
Patient remains sleep in the bed at this time.

## 2023-02-16 LAB — CBG MONITORING, ED
Glucose-Capillary: 271 mg/dL — ABNORMAL HIGH (ref 70–99)
Glucose-Capillary: 125 mg/dL — ABNORMAL HIGH (ref 70–99)

## 2023-02-16 MED ORDER — GLIPIZIDE 10 MG PO TABS
10.0000 mg | ORAL_TABLET | Freq: Every day | ORAL | Status: DC
  Administered 2023-02-16: 10 mg via ORAL
  Filled 2023-02-16: qty 1

## 2023-02-16 NOTE — Progress Notes (Signed)
02/16/2023  1315  Report given to Darl Pikes at [facility]. Will fax IVC [phone].

## 2023-02-16 NOTE — Progress Notes (Signed)
Pt has been accepted to [facility] TOMORROW 02/17/2023 Bed assignment: Main campus  Pt meets inpatient criteria per: Dahlia Byes NP  Attending Physician will be Loni Beckwith, MD  Report can be called to: [phone] (this is a pager, please leave call-back number when giving report)  Pt can arrive after 8 AM  Care Team Notified: Dahlia Byes NP, [facility] Paramedic   [place] LCSW-A   02/16/2023 12:28 PM

## 2023-02-16 NOTE — Progress Notes (Signed)
02/16/2023  1255  Called sheriff to transport patient to [facility] 02/17/2023. Sheriff Penrose asked if she could go today d/t they will already be transporting one later to [facility]. Called [facility] spoke with Lurena Joiner [phone] and she states it will be fine for patient to transport today 02/16/2023 to [facility].

## 2023-02-17 DIAGNOSIS — Z794 Long term (current) use of insulin: Secondary | ICD-10-CM | POA: Diagnosis not present

## 2023-02-17 DIAGNOSIS — Z7984 Long term (current) use of oral hypoglycemic drugs: Secondary | ICD-10-CM | POA: Diagnosis not present

## 2023-02-17 DIAGNOSIS — E559 Vitamin D deficiency, unspecified: Secondary | ICD-10-CM | POA: Diagnosis not present

## 2023-02-17 DIAGNOSIS — Z62810 Personal history of physical and sexual abuse in childhood: Secondary | ICD-10-CM | POA: Diagnosis not present

## 2023-02-17 DIAGNOSIS — G4700 Insomnia, unspecified: Secondary | ICD-10-CM | POA: Diagnosis not present

## 2023-02-17 DIAGNOSIS — E782 Mixed hyperlipidemia: Secondary | ICD-10-CM | POA: Diagnosis not present

## 2023-02-17 DIAGNOSIS — Z7985 Long-term (current) use of injectable non-insulin antidiabetic drugs: Secondary | ICD-10-CM | POA: Diagnosis not present

## 2023-02-17 DIAGNOSIS — E785 Hyperlipidemia, unspecified: Secondary | ICD-10-CM | POA: Diagnosis not present

## 2023-02-17 DIAGNOSIS — Z91410 Personal history of adult physical and sexual abuse: Secondary | ICD-10-CM | POA: Diagnosis not present

## 2023-02-17 DIAGNOSIS — F312 Bipolar disorder, current episode manic severe with psychotic features: Secondary | ICD-10-CM | POA: Diagnosis not present

## 2023-02-17 DIAGNOSIS — E119 Type 2 diabetes mellitus without complications: Secondary | ICD-10-CM | POA: Diagnosis not present

## 2023-05-29 ENCOUNTER — Other Ambulatory Visit: Payer: Self-pay | Admitting: Family Medicine

## 2023-05-29 DIAGNOSIS — E119 Type 2 diabetes mellitus without complications: Secondary | ICD-10-CM

## 2023-05-31 ENCOUNTER — Other Ambulatory Visit: Payer: Self-pay

## 2023-05-31 ENCOUNTER — Encounter (HOSPITAL_COMMUNITY): Payer: Self-pay | Admitting: Emergency Medicine

## 2023-05-31 ENCOUNTER — Emergency Department (HOSPITAL_COMMUNITY)
Admission: EM | Admit: 2023-05-31 | Discharge: 2023-06-01 | Disposition: A | Discharge status: Other Acute Inpatient Hospital | Attending: Emergency Medicine | Admitting: Emergency Medicine

## 2023-05-31 DIAGNOSIS — F22 Delusional disorders: Secondary | ICD-10-CM | POA: Diagnosis not present

## 2023-05-31 DIAGNOSIS — F312 Bipolar disorder, current episode manic severe with psychotic features: Secondary | ICD-10-CM | POA: Diagnosis not present

## 2023-05-31 DIAGNOSIS — F29 Unspecified psychosis not due to a substance or known physiological condition: Secondary | ICD-10-CM | POA: Diagnosis not present

## 2023-05-31 DIAGNOSIS — F411 Generalized anxiety disorder: Secondary | ICD-10-CM | POA: Insufficient documentation

## 2023-05-31 DIAGNOSIS — E119 Type 2 diabetes mellitus without complications: Secondary | ICD-10-CM | POA: Insufficient documentation

## 2023-05-31 DIAGNOSIS — R9431 Abnormal electrocardiogram [ECG] [EKG]: Secondary | ICD-10-CM | POA: Diagnosis not present

## 2023-05-31 LAB — CBC WITH DIFFERENTIAL/PLATELET
Abs Immature Granulocytes: 0.02 10*3/uL (ref 0.00–0.07)
Basophils Absolute: 0 10*3/uL (ref 0.0–0.1)
Basophils Relative: 1 %
Eosinophils Absolute: 0.1 10*3/uL (ref 0.0–0.5)
Eosinophils Relative: 2 %
HCT: 40.6 % (ref 36.0–46.0)
Hemoglobin: 12.9 g/dL (ref 12.0–15.0)
Immature Granulocytes: 0 %
Lymphocytes Relative: 31 %
Lymphs Abs: 1.8 10*3/uL (ref 0.7–4.0)
MCH: 27.6 pg (ref 26.0–34.0)
MCHC: 31.8 g/dL (ref 30.0–36.0)
MCV: 86.9 fL (ref 80.0–100.0)
Monocytes Absolute: 0.5 10*3/uL (ref 0.1–1.0)
Monocytes Relative: 9 %
Neutro Abs: 3.4 10*3/uL (ref 1.7–7.7)
Neutrophils Relative %: 57 %
Platelets: 317 10*3/uL (ref 150–400)
RBC: 4.67 MIL/uL (ref 3.87–5.11)
RDW: 13 % (ref 11.5–15.5)
WBC: 5.9 10*3/uL (ref 4.0–10.5)
nRBC: 0 % (ref 0.0–0.2)

## 2023-05-31 LAB — ETHANOL: Alcohol, Ethyl (B): <10 mg/dL (ref <10)

## 2023-05-31 LAB — COMPREHENSIVE METABOLIC PANEL WITH GFR
ALT: 17 U/L (ref 0–44)
AST: 18 U/L (ref 15–41)
Albumin: 3.8 g/dL (ref 3.5–5.0)
Alkaline Phosphatase: 43 U/L (ref 38–126)
Anion gap: 9 (ref 5–15)
BUN: 12 mg/dL (ref 6–20)
CO2: 21 mmol/L — ABNORMAL LOW (ref 22–32)
Calcium: 8.8 mg/dL — ABNORMAL LOW (ref 8.9–10.3)
Chloride: 102 mmol/L (ref 98–111)
Creatinine, Ser: 0.33 mg/dL — ABNORMAL LOW (ref 0.44–1.00)
GFR, Estimated: >60 mL/min (ref >60)
Glucose, Bld: 165 mg/dL — ABNORMAL HIGH (ref 70–99)
Potassium: 3.3 mmol/L — ABNORMAL LOW (ref 3.5–5.1)
Sodium: 132 mmol/L — ABNORMAL LOW (ref 135–145)
Total Bilirubin: 0.9 mg/dL (ref 0.0–1.2)
Total Protein: 7.3 g/dL (ref 6.5–8.1)

## 2023-05-31 LAB — RAPID URINE DRUG SCREEN, HOSP PERFORMED
Amphetamines: NOT DETECTED
Barbiturates: NOT DETECTED
Benzodiazepines: NOT DETECTED
Cocaine: NOT DETECTED
Opiates: NOT DETECTED
Tetrahydrocannabinol: NOT DETECTED

## 2023-05-31 LAB — CBG MONITORING, ED
Glucose-Capillary: 157 mg/dL — ABNORMAL HIGH (ref 70–99)
Glucose-Capillary: 153 mg/dL — ABNORMAL HIGH (ref 70–99)
Glucose-Capillary: 127 mg/dL — ABNORMAL HIGH (ref 70–99)

## 2023-05-31 LAB — HCG, SERUM, QUALITATIVE: Preg, Serum: NEGATIVE

## 2023-05-31 MED ORDER — AMITRIPTYLINE HCL 10 MG PO TABS
10.0000 mg | ORAL_TABLET | Freq: Every day | ORAL | Status: DC
  Filled 2023-05-31: qty 1

## 2023-05-31 MED ORDER — ELDERBERRY-VITAMIN C-ZINC 50-45-3.8 MG PO CHEW: 1.0000 | CHEWABLE_TABLET | Freq: Every day | ORAL | Status: DC

## 2023-05-31 MED ORDER — LORAZEPAM 2 MG/ML IJ SOLN: 2.0000 mg | Freq: Four times a day (QID) | INTRAMUSCULAR | Status: DC | PRN

## 2023-05-31 MED ORDER — ACETAMINOPHEN 325 MG PO TABS: 650.0000 mg | ORAL_TABLET | ORAL | Status: DC | PRN

## 2023-05-31 MED ORDER — POTASSIUM CHLORIDE CRYS ER 20 MEQ PO TBCR
40.0000 meq | EXTENDED_RELEASE_TABLET | Freq: Once | ORAL | Status: AC
  Administered 2023-05-31: 40 meq via ORAL
  Filled 2023-05-31: qty 2

## 2023-05-31 MED ORDER — HALOPERIDOL LACTATE 5 MG/ML IJ SOLN
5.0000 mg | Freq: Four times a day (QID) | INTRAMUSCULAR | Status: DC | PRN
Start: 2023-05-31 — End: 2023-06-01

## 2023-05-31 MED ORDER — DIVALPROEX SODIUM ER 250 MG PO TB24
250.0000 mg | ORAL_TABLET | Freq: Three times a day (TID) | ORAL | Status: DC
Start: 2023-05-31 — End: 2023-06-01
  Administered 2023-05-31 – 2023-06-01 (×2): 250 mg via ORAL
  Filled 2023-05-31 (×4): qty 1

## 2023-05-31 MED ORDER — VITAMIN D 25 MCG (1000 UNIT) PO TABS
5000.0000 [IU] | ORAL_TABLET | Freq: Every day | ORAL | Status: DC
  Administered 2023-06-01: 5000 [IU] via ORAL
  Filled 2023-05-31 (×2): qty 5

## 2023-05-31 MED ORDER — RISPERIDONE 0.5 MG PO TBDP
1.0000 mg | ORAL_TABLET | Freq: Two times a day (BID) | ORAL | Status: DC
  Administered 2023-05-31 – 2023-06-01 (×2): 1 mg via ORAL
  Filled 2023-05-31 (×3): qty 2

## 2023-05-31 MED ORDER — HYDROXYZINE HCL 25 MG PO TABS: 25.0000 mg | ORAL_TABLET | Freq: Three times a day (TID) | ORAL | Status: DC | PRN

## 2023-05-31 NOTE — ED Provider Notes (Signed)
 [place] EMERGENCY DEPARTMENT AT [facility] Provider Note   CSN: [id number] Arrival date & time: 05/31/23  4132     History  No chief complaint on file.   Laura Thompson is a 48 y.o. female.  Patient is a 48 year old female with a past medical history of bipolar disorder and diabetes presenting to the emergency department under IVC.  Patient's has been filed IVC for acute psychosis.  He reports that she has a history of bipolar disorder and has not been taking her meds.  She has been increasingly paranoid, disabling all electrical items in the home saying electronics are spying on and watching her, accusing neighbors of watching her.  She was asking for a firearm to protect herself and was engaging in conversations with herself.  The patient does endorse to me that she has not been taking her medications and states that she only takes supplements.  She denies any SI or HI to me.  She denies any other complaints.  The history is provided by the patient.       Home Medications Prior to Admission medications   Medication Sig Start Date End Date Taking? Authorizing Provider  amitriptyline (ELAVIL) 10 MG tablet Take 1 tablet (10 mg total) by mouth at bedtime. Patient not taking: Reported on 02/15/2023 12/02/22   Karie Georges, MD  ascorbic acid (VITAMIN C) 500 MG tablet Take 500 mg by mouth daily.    [provider]  Cholecalciferol (VITAMIN D3) 125 MCG (5000 UT) CAPS Take 5,000 Units by mouth daily.    [provider]  ELDERBERRY IMMUNE HEALTH GUMMY 50-45-3.8 MG CHEW Chew 1 tablet by mouth daily.    [provider]  GINKGO BILOBA PO Take 1 capsule by mouth daily.    [provider]  glipiZIDE (GLUCOTROL) 10 MG tablet Take 10 mg by mouth daily. 11/05/22   [provider]  TRULICITY 0.75 MG/0.5ML SOAJ ADMINISTER 0.75 MG UNDER THE SKIN 1 TIME A WEEK 05/29/23   Karie Georges, MD  Turmeric 500 MG CAPS Take 500 mg by mouth  daily.    [provider]  vitamin B-12 (CYANOCOBALAMIN) 500 MCG tablet Take 500 mcg by mouth daily.    [provider]  zinc gluconate 50 MG tablet Take 50 mg by mouth daily.    [provider]  zolpidem (AMBIEN) 10 MG tablet Take 10 mg by mouth at bedtime as needed for sleep.    [provider]      Allergies    Aripiprazole, Diphenhydramine, Doxycycline, Iodinated contrast media, Iodine, Sulfa antibiotics, and Metformin and related    Review of Systems   Review of Systems  Physical Exam Updated Vital Signs BP (!) 141/99 (BP Location: Right Arm)   Pulse 76   Temp 98.2 F (36.8 C) (Oral)   Resp 17   Ht 5' 3.5" (1.613 m)   Wt 72.6 kg   LMP 05/16/2023 (Approximate)   SpO2 100%   BMI 27.90 kg/m  Physical Exam Vitals and nursing note reviewed.  Constitutional:      General: She is not in acute distress.    Appearance: Normal appearance.  HENT:     Head: Normocephalic and atraumatic.     Nose: Nose normal.     Mouth/Throat:     Mouth: Mucous membranes are moist.  Eyes:     Extraocular Movements: Extraocular movements intact.     Conjunctiva/sclera: Conjunctivae normal.  Cardiovascular:     Rate and Rhythm:  Normal rate and regular rhythm.     Heart sounds: Normal heart sounds.  Pulmonary:     Effort: Pulmonary effort is normal.     Breath sounds: Normal breath sounds.  Abdominal:     General: Abdomen is flat.     Palpations: Abdomen is soft.     Tenderness: There is no abdominal tenderness.  Musculoskeletal:        General: Normal range of motion.     Cervical back: Normal range of motion.  Skin:    General: Skin is warm and dry.  Neurological:     General: No focal deficit present.     Mental Status: She is alert and oriented to person, place, and time.  Psychiatric:     Comments: Somewhat pressured speech, flight of ideas but easily redirectable. Cooperative.     ED Results / Procedures / Treatments   Labs (all labs  ordered are listed, but only abnormal results are displayed) Labs Reviewed  COMPREHENSIVE METABOLIC PANEL WITH GFR - Abnormal; Notable for the following components:      Result Value   Sodium 132 (*)    Potassium 3.3 (*)    CO2 21 (*)    Glucose, Bld 165 (*)    Creatinine, Ser 0.33 (*)    Calcium 8.8 (*)    All other components within normal limits  CBG MONITORING, ED - Abnormal; Notable for the following components:   Glucose-Capillary 157 (*)    All other components within normal limits  ETHANOL  CBC WITH DIFFERENTIAL/PLATELET  RAPID URINE DRUG SCREEN, HOSP PERFORMED  HCG, SERUM, QUALITATIVE    EKG EKG Interpretation Date/Time:  Sunday May 31 2023 08:34:35 EDT Ventricular Rate:  64 PR Interval:  144 QRS Duration:  98 QT Interval:  418 QTC Calculation: 432 R Axis:   -1  Text Interpretation: Sinus rhythm No significant change since last tracing Confirmed by Celesta Coke ([id number]) on 05/31/2023 8:47:40 AM  Radiology No results found.  Procedures Procedures    Medications Ordered in ED Medications  potassium chloride SA (KLOR-CON M) CR tablet 40 mEq (has no administration in time range)  acetaminophen (TYLENOL) tablet 650 mg (has no administration in time range)    ED Course/ Medical Decision Making/ A&P Clinical Course as of 05/31/23 0923  Sun May 31, 2023  0920 Mild hypokalemia, will be repleted. Otherwise no significant abnormality. She is medically cleared for psych eval. [VK]    Clinical Course User Index [VK] Kingsley, Amore Grater K, DO                                 Medical Decision Making This patient presents to the ED with chief complaint(s) of IVC, paranoia with pertinent past medical history of bipolar disorder, diabetes which further complicates the presenting complaint. The complaint involves an extensive differential diagnosis and also carries with it a high risk of complications and morbidity.    The differential diagnosis includes  mania/decompensated bipolar disorder, psychosis, hyperglycemic, hyperglycemic crisis  Additional history obtained: Additional history obtained from family Records reviewed Care Everywhere/External Records  ED Course and Reassessment: On patient's arrival she is hemodynamically stable in no acute distress.  Was placed under IVC by husband.  Will be placed on one-to-one observation.  Will have labs performed for medical clearance prior to psychiatry evaluation.  He has no physical complaints at this time.  Independent labs interpretation:  The following labs were independently  interpreted: mild hypokalemia, otherwise within normal range  Independent visualization of imaging: - N/A  Consultation: - Consulted or discussed management/test interpretation w/ external professional: TTS    Amount and/or Complexity of Data Reviewed Labs: ordered.  Risk OTC drugs. Prescription drug management.          Final Clinical Impression(s) / ED Diagnoses Final diagnoses:  Paranoia (psychosis) [facility])    Rx / DC Orders ED Discharge Orders     None         Kingsley, Jeffrie Stander K, DO 05/31/23 [phone]

## 2023-05-31 NOTE — Progress Notes (Signed)
 [facility] LCSW Progress Note   05/31/2023    12:10 PM  Laura Thompson   [id number]   Type of Contact and Topic:  Psychiatric Bed Placement   Pt accepted to [facility]     Patient meets inpatient criteria per [name], [facility]  The attending provider will be Dr. Allan Ishihara  Call report to [phone]  Neldon Baltimore, Paramedic @ [facility] ED notified.     Pt scheduled  to arrive at [facility] for TOMORROW after 0900.    Kriss Perleberg, MSW, LCSW-A  12:11 PM 05/31/2023

## 2023-05-31 NOTE — ED Triage Notes (Signed)
 IVC. Denies pain N/V/D.

## 2023-05-31 NOTE — ED Notes (Signed)
 Pt belongings is in locker 42 ( 2 bags shirt, pant, shoes ,coat)

## 2023-05-31 NOTE — ED Notes (Addendum)
 The patient has been accepted to [facility] tomorrow after 09:00  - Accepting: Dr Allan Ishihara - Report can be called to [phone]

## 2023-05-31 NOTE — ED Notes (Signed)
 [address] called and was asking if pt needs placement

## 2023-05-31 NOTE — Progress Notes (Signed)
 Patient has been denied by [facility] and has been faxed out. Patient meets BH inpatient criteria per [facility], PHMNP. Patient has been faxed out to the following facilities:   [facility] CenterPending - Request [address] [place] [place] [phone]--[facility]ZDG[facility]LOV[facility] - Request Sent--[address], [place] [phone]--TFTDD-UKGURKYH DunesPending - Request [address], [place] [phone]--RSWNI-OEVOJJKK [facility] RidgePending - Request Sent--[address] [place], [place] [phone]--WCHEN-IDPOEU Health-Behavioral Health Patient PlacementPending - Request Sent--[facility], [phone]--[facility] HealthPending - Request Sent--[address] [place] [phone]--IPJAS-NKNLZJ [place] PointPending - Request Sent--[place] [phone]--STMHD-QQIWLN [facility] ForestPending - Request Sent--[facility] Josephina Nicks [place] [phone]--FWYOV-ZCH [facility] HealthPending - Request Sent--[address] [place] [phone]--GEZMO-QHU Ellan Gunner - Request Sent--[address], [phone]--[facility] HealthPending - Request [address], [place] [phone]--BSWHQ-PRFFM [facility] - Request Sent--[address] [place] [phone]--RAQTM-AUQJFHL [facility] - Request [address] [place], [place] [phone]--MBTDH-RCBULAGT SpringsPending - Request [address] [place] [phone]--VQXIH-WTUUEKC [facility] HealthPending - Request [address], [place] [phone]--SMOLM-BEML [facility] - Request Sent--[address], [place] [phone]--MEBRA-XENMMHW [facility] - Request Sent--[address] [place]illia Cogan [facility] [phone]--[facility] HealthcarePending - Request [address] [place] [phone]--  Phares Brasher, MSW, LCSW-A  11:50 AM 05/31/2023

## 2023-05-31 NOTE — ED Notes (Signed)
 Pt given supplies to shower with.  Pt currently showering

## 2023-05-31 NOTE — Consult Note (Signed)
 [facility] Psychiatric Consult Initial  Patient Name: .Laura Thompson  MRN: [id number]  DOB: Sep 20, 1975  Consult Order details:  Orders (From admission, onward)     Start     Ordered   05/31/23 0922  CONSULT TO CALL ACT TEAM       Ordering Provider: Nolberto Batty, DO  Provider:  (Not yet assigned)  Question:  Reason for Consult?  Answer:  Psych consult   05/31/23 0922             Mode of Visit: In person    Psychiatry Consult Evaluation  Service Date: May 31, 2023 LOS:  LOS: 0 days  Chief Complaint Manic, Paranoid   Primary Psychiatric Diagnoses  Bipolar Affective disorder, manic Severe with Psychotic features 2.   GAD  Assessment  Laura Thompson is a 48 y.o. female admitted: Presented to the ED on 05/31/2023  7:14 AM for "manic behavior". She carries the psychiatric diagnoses of Bipolar disorder and has a past medical history of DM.   Her current presentation of Manic symptoms  is most consistent with Bipolar disorder. She meets criteria for Inpatient Psychiatry hospitalization based on Medication non compliance,no outpatient Psychiatrist and she is a danger to self and others due to cognitive impairment..  Current outpatient psychotropic medications include NONE.   and historically she has had  no response to these medications. She was not compliant with medications prior to admission as evidenced by report from spouse and her Manic symptoms. On initial examination, patient is paranoid, uncooperative. Please see plan below for detailed recommendations.    Diagnoses:  Active Hospital problems: Principal Problem:   Bipolar disorder, current episode manic severe with psychotic features (HCC) Active Problems:   GAD (generalized anxiety disorder)    Plan   ## Psychiatric Medication Recommendations:  Risperidone 2 mg po at bed time for Psychosis/Manic Symptoms Depakote  500 mg ER 24 HRS orally at bed time. Amitriptyline 10 mg po at bed time for sleep. ##  Medical Decision Making Capacity:  Patient is her own legal guardian and can make her own decision when stable.   ## Further Work-up:    U/A or UDS, EKG -- most recent EKG Qtc 432 on 05/31/2023 -- Pertinent labwork reviewed earlier this admission includes: CMP, CBC, Ha1c, Tylenol level, Alcohol level     ## Disposition:-- We recommend inpatient psychiatric hospitalization after medical hospitalization. Patient has been involuntarily committed on 05/31/2023.    ## Behavioral / Environmental: -Recommend using specific terminology regarding PNES, i.e. call the episodes "non-epileptic seizures" rather than "pseudoseizures" as the latter insinuates "fake" or "feigned" symptoms, when the events are a very real experience to the patient and are a physical, non-volitional, manifestation of fear, pain and anxiety.  or To minimize splitting of staff, assign one staff person to communicate all information from the team when feasible.                ## Safety and Observation Level:  - Based on my clinical evaluation, I estimate the patient to be at low risk of self harm in the current setting. - At this time, we recommend  routine. This decision is based on my review of the chart including patient's history and current presentation, interview of the patient, mental status examination, and consideration of suicide risk including evaluating suicidal ideation, plan, intent, suicidal or self-harm behaviors, risk factors, and protective factors. This judgment is based on our ability to directly address suicide risk, implement suicide prevention strategies, and  develop a safety plan while the patient is in the clinical setting. Please contact our team if there is a concern that risk level has changed.   CSSR Risk Category:C-SSRS RISK CATEGORY: No Risk   Suicide Risk Assessment: Patient has following modifiable risk factors for suicide: untreated depression, under treated depression , medication noncompliance, and  lack of access to outpatient mental health resources, which we are addressing by recommending inpatient Psychiatry hospitalization. Patient has following non-modifiable or demographic risk factors for suicide: psychiatric hospitalization Patient has the following protective factors against suicide: Supportive family, Cultural, spiritual, or religious beliefs that discourage suicide, Minor children in the home, and no history of suicide attempts   Thank you for this consult request. Recommendations have been communicated to the primary team.  We will seek bed placement. at this time.   [name], PMHNP       History of Present Illness  Relevant Aspects of Hospital ED Course:  Admitted on 05/31/2023 for  Manic Symptoms brought in by her husband for erratic behavior, paranoia and Hallucination.  Patient was not able to engage in any meaningful conversation with provider.      Laura Thompson, 48 y.o., female patient seen face to face by this provider, consulted with Dr. Deborah Falling; and chart reviewed on 05/31/23.  On evaluation Laura Thompson is sitting up in bed, questioning provider and providers title, and asked who I report to, then asked if the psychiatrist I report to is seeing new patients, " because in order for me to take medications that you all will prescribe me, I have to follow up with a psychiatrist" she then began asking about the psychiatrist credentials and if he had a practice that she could make an appointment to go to.  Patient conversation was all over the place, speech very tangential.  She is alert and oriented to self, environment, and time.  She states she is very upset about missing church this morning, does not feel like she needs to be here, states she will not take any medications, she will only take vitamins. There is evidence of mania, as patient jumps up from the conversation, states he has to use the bathroom, and begins to leave the room.  Tell provider she does  not want to talk anymore.  Psych ROS:  Depression: NA Anxiety:  YES Mania (lifetime and current): YES Psychosis: (lifetime and current): YES    Collateral information:  Contacted Aylanie Cubillos, he reports that things started changing about 2 weeks ago, but begin to get worse about 4 days ago.  He states what he believes triggered this episode, was patient was planning an elegant birthday party for her mother, and he feels that the stress of planning a party led to her having a manic episode.  She states what makes it even worse this time and that patient has been asking about obtaining a gun, states that she has been going to gun shops and trying to purchase a gun.  He states that they do have guns in their home, but they are locked up, states patient has been trying to get access to his locked cabinet where the guns are. She is was worried that people are out to get her.  She has been talking to herself and to unseen people.  For three to four day she has not slept more than one to two hours. Mr Malcom Scriver reports she has even been promiscuous on her job, states that she is a Runner, broadcasting/film/video in [place] , has been promiscuous, impulsive, and he feels that she has possibly lost her job due to being manic episodes.  States that he and patient have been married for 22 years, so they have a daughter who is a Holiday representative at [employer], says that patient has been dealing with mental illness since their daughter was 68 years old.  He states that patient has been spontaneous, and impulsive with her spending, has also been very mean and aggressive. Spouse denies any alcohol or drug use.  He reports patient does not drink alcohol as she is a Runner, broadcasting/film/video Patient was last hospitalized January and December 2024.  From the time she was discharged till now no Medication taken and she has not seen a Psychiatry provider. Patient Meets criteria for inpatient Psychiatry hospitalization.  We will seek placement at any facility with  available beds.    Review of Systems  Psychiatric/Behavioral:  Positive for hallucinations. The patient has insomnia.      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 Hx: Denies Living Situation: Lives with spouse Spiritual Hx: Yes Access to weapons/lethal means: Denies  Substance History Alcohol: Occasionally Type of alcohol wine Last Drink unknown Number of drinks per day N/A History of alcohol withdrawal seizures denies History of DT's denies Tobacco: Denies Illicit drugs: Past history of THC Prescription drug abuse: Denies Rehab hx: Denied  Exam Findings  Physical Exam:  Vital Signs:  Temp:  [98.2 F (36.8 C)] 98.2 F (36.8 C) (04/13 0732) Pulse Rate:  [76] 76 (04/13 0732) Resp:  [17] 17 (04/13 0732) BP: (141)/(99) 141/99 (04/13 0732) SpO2:  [100 %] 100 % (04/13 0732) Weight:  [72.6 kg] 72.6 kg (04/13 0726) Blood pressure (!) 141/99, pulse 76, temperature 98.2 F (36.8 C), temperature source Oral, resp. rate 17, height 5' 3.5" (1.613 m), weight 72.6 kg, last menstrual period 05/16/2023, SpO2 100%. Body mass index is 27.9 kg/m.  Physical Exam Vitals and nursing note reviewed. Exam conducted with a chaperone present.  Psychiatric:        Attention and Perception: She is inattentive.        Mood and Affect: Mood is anxious. Affect is blunt.        Speech: Speech is rapid and pressured.        Behavior: Behavior is agitated and withdrawn.        Cognition and Memory: Cognition is impaired.         Judgment: Judgment is impulsive.     Mental Status Exam: General Appearance: Casual and Neat  Orientation:  Other:  oriented to self and name, time  Memory:   Unable to obtain, patient is Manic   Concentration:  Concentration: Poor and Attention Span: Poor  Recall:   unable to engage, laughing  Attention  Poor  Eye Contact:  Minimal  Speech:   Rapid and pressured  Language:  Fair  Volume:  Normal  Mood: Manic, laughing  Affect:  Congruent and hyperactive, labile  Thought Process:  Disorganized and Irrelevant  Thought Content:  Illogical, Paranoid Ideation, and inappropriate smiling  Suicidal Thoughts:   No  Homicidal Thoughts:  No  Judgement:  Impaired  Insight:  Lacking  Psychomotor Activity:  Increased, Restlessness  Akathisia:  NA  Fund of Knowledge:  Fair       Assets:  Biomedical engineer Intimacy Physical Health Talents/Skills Vocational/Educational  Cognition:  Impaired,  Moderate  ADL's:  Intact  AIMS (if indicated):        Other History   These have been pulled in through the EMR, reviewed, and updated if appropriate.  Family History:  The patient's family history includes Diabetes in her father; Glaucoma in her father; Hypertension in her father; Thyroid disease in her mother.  Medical History: Past Medical History:  Diagnosis Date   Bipolar 1 disorder (HCC)    Diabetes mellitus without complication (HCC)     Surgical History: Past Surgical History:  Procedure Laterality Date   CERVICAL ABLATION     DENTAL SURGERY     lipoma removal       Medications:   Current Facility-Administered Medications:    acetaminophen (TYLENOL) tablet 650 mg, 650 mg, Oral, Q4H PRN, Kingsley, Victoria K, DO   amitriptyline (ELAVIL) tablet 10 mg, 10 mg, Oral, QHS, Motley-Mangrum, Hanson Medeiros A, PMHNP   divalproex (DEPAKOTE ER) 24 hr tablet 250 mg, 250 mg, Oral, TID, Motley-Mangrum, Sukanya Goldblatt A, PMHNP   haloperidol lactate (HALDOL) injection 5 mg, 5  mg, Intramuscular, Q6H PRN, Motley-Mangrum, Daaron Dimarco A, PMHNP   hydrOXYzine (ATARAX) tablet 25 mg, 25 mg, Oral, TID PRN, Motley-Mangrum, Ayva Veilleux A, PMHNP   LORazepam (ATIVAN) injection 2 mg, 2 mg, Intramuscular, Q6H PRN, Motley-Mangrum, Haeli Gerlich A, PMHNP   risperiDONE (RISPERDAL M-TABS) disintegrating tablet 1 mg, 1 mg, Oral, BID, Motley-Mangrum, Issabelle Mcraney A, PMHNP  Current Outpatient Medications:    amitriptyline (ELAVIL) 10 MG tablet, Take 1 tablet (10 mg total) by mouth at bedtime. (Patient not taking: Reported on 02/15/2023), Disp: 30 tablet, Rfl: 3   ascorbic acid (VITAMIN C) 500 MG tablet, Take 500 mg by mouth daily., Disp: , Rfl:    Cholecalciferol (VITAMIN D3) 125 MCG (5000 UT) CAPS, Take 5,000 Units by mouth daily., Disp: , Rfl:    ELDERBERRY IMMUNE HEALTH GUMMY 50-45-3.8 MG CHEW, Chew 1 tablet by mouth daily., Disp: , Rfl:    GINKGO BILOBA PO, Take 1 capsule by mouth daily., Disp: , Rfl:    glipiZIDE (GLUCOTROL) 10 MG tablet, Take 10 mg by mouth daily., Disp: , Rfl:    TRULICITY 0.75 MG/0.5ML SOAJ, ADMINISTER 0.75 MG UNDER THE SKIN 1 TIME A WEEK, Disp: 2 mL, Rfl: 0   Turmeric 500 MG CAPS, Take 500 mg by mouth daily., Disp: , Rfl:    vitamin B-12 (CYANOCOBALAMIN) 500 MCG tablet, Take 500 mcg by mouth daily., Disp: , Rfl:    zinc gluconate 50 MG tablet, Take 50 mg by mouth daily., Disp: , Rfl:    zolpidem (AMBIEN) 10 MG tablet, Take 10 mg by mouth at bedtime as needed for sleep., Disp: , Rfl:   Allergies: Allergies  Allergen Reactions   Aripiprazole Other (See Comments)    Manic-like Sx happened in March 2019   Diphenhydramine Hives   Doxycycline Hives   Iodinated Contrast Media Hives   Iodine Hives   Sulfa Antibiotics Hives   Metformin And Related Diarrhea    Porscha Axley MOTLEY-MANGRUM, PMHNP

## 2023-06-01 DIAGNOSIS — F319 Bipolar disorder, unspecified: Secondary | ICD-10-CM | POA: Diagnosis not present

## 2023-06-01 DIAGNOSIS — F312 Bipolar disorder, current episode manic severe with psychotic features: Secondary | ICD-10-CM | POA: Diagnosis not present

## 2023-06-01 DIAGNOSIS — F3113 Bipolar disorder, current episode manic without psychotic features, severe: Secondary | ICD-10-CM | POA: Diagnosis not present

## 2023-06-01 DIAGNOSIS — F411 Generalized anxiety disorder: Secondary | ICD-10-CM | POA: Diagnosis not present

## 2023-06-01 DIAGNOSIS — E119 Type 2 diabetes mellitus without complications: Secondary | ICD-10-CM | POA: Diagnosis not present

## 2023-06-01 DIAGNOSIS — Z7985 Long-term (current) use of injectable non-insulin antidiabetic drugs: Secondary | ICD-10-CM | POA: Diagnosis not present

## 2023-06-01 DIAGNOSIS — Z638 Other specified problems related to primary support group: Secondary | ICD-10-CM | POA: Diagnosis not present

## 2023-06-01 NOTE — ED Notes (Signed)
 [facility]'s Office contacted for transport. Will call back with an eta.

## 2023-06-01 NOTE — ED Notes (Signed)
 Report given to Jenette Mitchell at [facility].

## 2023-06-01 NOTE — ED Notes (Signed)
 Laura Thompson    [id number]    Type of Contact and Topic:  Psychiatric Bed Placement    Pt accepted to [facility]      Patient meets inpatient criteria per [name], [facility]   The attending provider will be Dr. Allan Ishihara   Call report to [phone]   Neldon Baltimore, Paramedic @ [facility] ED notified.

## 2023-06-01 NOTE — ED Provider Notes (Signed)
 Emergency Medicine Observation Re-evaluation Note  Laura Thompson is a 48 y.o. female, seen on rounds today.  Pt initially presented to the ED for complaints of No chief complaint on file. Currently, the patient is excepted to [facility] facility.  Patient has no complaints at this time.  She is up and ambulating about and brushing her teeth getting ready to go.  Physical Exam  BP 110/69 (BP Location: Right Arm)   Pulse 89   Temp 98.2 F (36.8 C) (Oral)   Resp 16   Ht 5' 3.5" (1.613 m)   Wt 72.6 kg   LMP 05/16/2023 (Approximate)   SpO2 100%   BMI 27.90 kg/m  Physical Exam General: Alert ambulating no distress Cardiac: Regular Lungs: No respiratory distress Psych: Alert and following directions.  Oriented.  ED Course / MDM  EKG:EKG Interpretation Date/Time:  Sunday May 31 2023 08:34:35 EDT Ventricular Rate:  64 PR Interval:  144 QRS Duration:  98 QT Interval:  418 QTC Calculation: 432 R Axis:   -1  Text Interpretation: Sinus rhythm No significant change since last tracing Confirmed by Celesta Coke ([id number]) on 05/31/2023 8:47:40 AM  I have reviewed the labs performed to date as well as medications administered while in observation.  Recent changes in the last 24 hours include accepted to [facility] facility.  Plan  Current plan is for transfer for inpatient psychiatric treatment.  EMTALA completed.    Wynetta Heckle, MD 06/01/23 1114

## 2023-06-22 DIAGNOSIS — E1169 Type 2 diabetes mellitus with other specified complication: Secondary | ICD-10-CM | POA: Diagnosis not present

## 2023-07-09 ENCOUNTER — Other Ambulatory Visit: Payer: Self-pay

## 2023-07-09 ENCOUNTER — Emergency Department (HOSPITAL_COMMUNITY)
Admission: EM | Admit: 2023-07-09 | Discharge: 2023-07-10 | Disposition: A | Discharge status: Other Acute Inpatient Hospital | Attending: Emergency Medicine | Admitting: Emergency Medicine

## 2023-07-09 ENCOUNTER — Encounter (HOSPITAL_COMMUNITY): Payer: Self-pay

## 2023-07-09 DIAGNOSIS — Z7984 Long term (current) use of oral hypoglycemic drugs: Secondary | ICD-10-CM | POA: Diagnosis not present

## 2023-07-09 DIAGNOSIS — Z833 Family history of diabetes mellitus: Secondary | ICD-10-CM | POA: Diagnosis not present

## 2023-07-09 DIAGNOSIS — Z888 Allergy status to other drugs, medicaments and biological substances status: Secondary | ICD-10-CM | POA: Diagnosis not present

## 2023-07-09 DIAGNOSIS — R001 Bradycardia, unspecified: Secondary | ICD-10-CM | POA: Diagnosis not present

## 2023-07-09 DIAGNOSIS — F312 Bipolar disorder, current episode manic severe with psychotic features: Secondary | ICD-10-CM | POA: Diagnosis not present

## 2023-07-09 DIAGNOSIS — F3112 Bipolar disorder, current episode manic without psychotic features, moderate: Secondary | ICD-10-CM | POA: Diagnosis not present

## 2023-07-09 DIAGNOSIS — F319 Bipolar disorder, unspecified: Secondary | ICD-10-CM | POA: Diagnosis not present

## 2023-07-09 DIAGNOSIS — F411 Generalized anxiety disorder: Secondary | ICD-10-CM | POA: Diagnosis not present

## 2023-07-09 DIAGNOSIS — Z91041 Radiographic dye allergy status: Secondary | ICD-10-CM | POA: Diagnosis not present

## 2023-07-09 DIAGNOSIS — F3010 Manic episode without psychotic symptoms, unspecified: Secondary | ICD-10-CM

## 2023-07-09 DIAGNOSIS — E119 Type 2 diabetes mellitus without complications: Secondary | ICD-10-CM | POA: Insufficient documentation

## 2023-07-09 DIAGNOSIS — Z79899 Other long term (current) drug therapy: Secondary | ICD-10-CM | POA: Diagnosis not present

## 2023-07-09 DIAGNOSIS — F309 Manic episode, unspecified: Secondary | ICD-10-CM | POA: Diagnosis not present

## 2023-07-09 DIAGNOSIS — Z91148 Patient's other noncompliance with medication regimen for other reason: Secondary | ICD-10-CM | POA: Diagnosis not present

## 2023-07-09 DIAGNOSIS — F29 Unspecified psychosis not due to a substance or known physiological condition: Secondary | ICD-10-CM

## 2023-07-09 DIAGNOSIS — Z882 Allergy status to sulfonamides status: Secondary | ICD-10-CM | POA: Diagnosis not present

## 2023-07-09 DIAGNOSIS — Z881 Allergy status to other antibiotic agents status: Secondary | ICD-10-CM | POA: Diagnosis not present

## 2023-07-09 DIAGNOSIS — Z7985 Long-term (current) use of injectable non-insulin antidiabetic drugs: Secondary | ICD-10-CM | POA: Diagnosis not present

## 2023-07-09 LAB — CBC
HCT: 38 % (ref 36.0–46.0)
Hemoglobin: 12.5 g/dL (ref 12.0–15.0)
MCH: 28.2 pg (ref 26.0–34.0)
MCHC: 32.9 g/dL (ref 30.0–36.0)
MCV: 85.6 fL (ref 80.0–100.0)
Platelets: 275 10*3/uL (ref 150–400)
RBC: 4.44 MIL/uL (ref 3.87–5.11)
RDW: 13.2 % (ref 11.5–15.5)
WBC: 6.3 10*3/uL (ref 4.0–10.5)
nRBC: 0 % (ref 0.0–0.2)

## 2023-07-09 LAB — ETHANOL: Alcohol, Ethyl (B): <15 mg/dL (ref <15)

## 2023-07-09 LAB — COMPREHENSIVE METABOLIC PANEL WITH GFR
ALT: 14 U/L (ref 0–44)
AST: 17 U/L (ref 15–41)
Albumin: 3.6 g/dL (ref 3.5–5.0)
Alkaline Phosphatase: 46 U/L (ref 38–126)
Anion gap: 10 (ref 5–15)
BUN: 16 mg/dL (ref 6–20)
CO2: 22 mmol/L (ref 22–32)
Calcium: 8.8 mg/dL — ABNORMAL LOW (ref 8.9–10.3)
Chloride: 102 mmol/L (ref 98–111)
Creatinine, Ser: 0.6 mg/dL (ref 0.44–1.00)
GFR, Estimated: >60 mL/min (ref >60)
Glucose, Bld: 254 mg/dL — ABNORMAL HIGH (ref 70–99)
Potassium: 3.2 mmol/L — ABNORMAL LOW (ref 3.5–5.1)
Sodium: 134 mmol/L — ABNORMAL LOW (ref 135–145)
Total Bilirubin: 0.8 mg/dL (ref 0.0–1.2)
Total Protein: 7.1 g/dL (ref 6.5–8.1)

## 2023-07-09 LAB — HCG, SERUM, QUALITATIVE: Preg, Serum: NEGATIVE

## 2023-07-09 MED ORDER — STERILE WATER FOR INJECTION IJ SOLN
INTRAMUSCULAR | Status: AC
  Filled 2023-07-09: qty 10

## 2023-07-09 MED ORDER — ZIPRASIDONE MESYLATE 20 MG IM SOLR
20.0000 mg | Freq: Once | INTRAMUSCULAR | Status: AC
  Administered 2023-07-09: 20 mg via INTRAMUSCULAR
  Filled 2023-07-09: qty 20

## 2023-07-09 MED ORDER — HYDROMORPHONE HCL 1 MG/ML IJ SOLN: 1.0000 mg | Freq: Once | INTRAMUSCULAR | Status: DC

## 2023-07-09 NOTE — ED Notes (Signed)
 Refused labs.

## 2023-07-09 NOTE — ED Notes (Signed)
 Patient resting at this time after forced meds

## 2023-07-09 NOTE — ED Provider Notes (Cosign Needed Addendum)
 [place] EMERGENCY DEPARTMENT AT [facility] Provider Note   CSN: [id number] Arrival date & time: 07/09/23  1607     History  Chief Complaint  Patient presents with   Manic Behavior    Laura Thompson is a 48 y.o. female.  Patient brought in by her husband for manic behavior.  Patient's husband reports that patient has a history of bipolar disorder psychosis and mania.  She has not been taking her medications.  He reports that she has these episodes when she does not take her medications.  He reports that she has been paranoid she destroyed the doorbell and camera.  He states that she has been hypersexual.  He was trying to get her to get a copy of her birth certificate to go on a trip and she began threatening people at the office.  Patient is pacing in room, threatening staff.  Pt had 2 knives removed by security.   Pt has a history of bipolar disorder with mania and psychosis.  Patient has had previous hospitalizations.  She has a past medical history of diabetes.  Patient's husband states she is not currently taking any medications.  The history is provided by the patient and the spouse. No language interpreter was used.       Home Medications Prior to Admission medications   Medication Sig Start Date End Date Taking? Authorizing Provider  Cholecalciferol  (VITAMIN D3) 125 MCG (5000 UT) CAPS Take 5,000 Units by mouth daily.    [provider]  ELDERBERRY IMMUNE HEALTH GUMMY 50-45-3.8 MG CHEW Chew 1 tablet by mouth daily.    [provider]  GINKGO BILOBA PO Take 1 capsule by mouth daily.    [provider]  glipiZIDE  (GLUCOTROL ) 10 MG tablet Take 10 mg by mouth daily. Patient not taking: Reported on 05/31/2023 11/05/22   [provider]  rosuvastatin  (CRESTOR ) 10 MG tablet Take 10 mg by mouth daily. Patient not taking: Reported on 05/31/2023    [provider]  TRULICITY  0.75 MG/0.5ML SOAJ ADMINISTER 0.75 MG UNDER THE SKIN  1 TIME A WEEK Patient taking differently: Inject 0.75 mg into the skin once a week. 05/29/23   Aida House, MD  zolpidem  (AMBIEN ) 10 MG tablet Take 10 mg by mouth at bedtime as needed for sleep. Patient not taking: Reported on 05/31/2023    [provider]      Allergies    Aripiprazole , Diphenhydramine, Doxycycline, Iodinated contrast media, Iodine, Sulfa antibiotics, and Metformin and related    Review of Systems   Review of Systems  All other systems reviewed and are negative.   Physical Exam Updated Vital Signs BP 120/88 (BP Location: Left Arm)   Pulse 87   Temp 97.7 F (36.5 C) (Oral)   Resp 18   SpO2 100%  Physical Exam Vitals and nursing note reviewed.  Constitutional:      Appearance: She is well-developed.  HENT:     Head: Normocephalic.  Cardiovascular:     Rate and Rhythm: Normal rate.  Pulmonary:     Effort: Pulmonary effort is normal.  Abdominal:     General: There is no distension.  Musculoskeletal:        General: Normal range of motion.  Skin:    General: Skin is warm.  Neurological:     General: No focal deficit present.     Mental Status: She is alert and oriented to person, place, and time.  Psychiatric:     Comments: Pacing,  threatening staff and husband.     ED Results / Procedures / Treatments   Labs (all labs ordered are listed, but only abnormal results are displayed) Labs Reviewed  COMPREHENSIVE METABOLIC PANEL WITH GFR  ETHANOL  CBC  RAPID URINE DRUG SCREEN, HOSP PERFORMED  HCG, SERUM, QUALITATIVE    EKG None  Radiology No results found.  Procedures Procedures    Medications Ordered in ED Medications  ziprasidone  (GEODON ) injection 20 mg (20 mg Intramuscular Given 07/09/23 1720)  sterile water  (preservative free) injection (  Given 07/09/23 1721)    ED Course/ Medical Decision Making/ A&P                                 Medical Decision Making Patient brought in by her husband due to a acute psychotic  episode.  Amount and/or Complexity of Data Reviewed Labs: ordered. Decision-making details documented in ED Course.    Details: Labs ordered and reviewed.  Risk Prescription drug management. Risk Details: Pt seen and evaluated by me and Dr. Florie Husband. IVC placed.  Security to Market researcher moved pt to Psych hold  TTS will be consulted Pt given Geodon  20mg  IM.            Final Clinical Impression(s) / ED Diagnoses Final diagnoses:  Manic behavior (HCC)  Psychosis, unspecified psychosis type [facility])    Rx / DC Orders ED Discharge Orders     None         Evelyn Hire 07/09/23 1922    Evelyn Hire 07/09/23 2152    Afton Horse T, DO 07/13/23 [phone]

## 2023-07-09 NOTE — ED Notes (Signed)
 Husband took jewelry home - necklace and earrings.

## 2023-07-09 NOTE — BH Assessment (Signed)
 TTS attempted to assess pt. Allayne Arabian, RN reports pt was given 20 mg Geodon  earlier and is too somnolent for an assessment.

## 2023-07-09 NOTE — ED Notes (Addendum)
 Pt refused blood work stating, "I'm not giving you my blood".

## 2023-07-09 NOTE — ED Triage Notes (Signed)
 Husband states wife is manic. States over the past few days has been getting worse, today she threatened the neighbors, speaking to her self, being belligerent. Pt is diagnosed bipolar.  Pt states she has not taken her meds since January. Husband requesting help for his wife. Pt denies wanting to hurt herself or anyone else.

## 2023-07-09 NOTE — ED Notes (Signed)
 Pt husband said he wanted to grab his items that were taken from him by security and put in locker 28 upon his arrival. I went and got the items out of a locker that were in an unmarked bag and he said he was only grabbing his cigarettes and keys. Pt husband showed me the keys and cigarettes. I noticed the bag was now empty and said that. Husband denied taking jewelry and wife's phone. Pt husband stated he didn't take them and then finally admitted to taking her jewelery that she was "fine with it." Security and nurses/techs in the TCU aware. Charge made aware.

## 2023-07-10 ENCOUNTER — Inpatient Hospital Stay (HOSPITAL_COMMUNITY)
Admission: AD | Admit: 2023-07-10 | Discharge: 2023-07-15 | DRG: 885 | Disposition: A | Discharge status: Home / Self Care | Source: Intra-hospital | Attending: Psychiatry | Admitting: Psychiatry

## 2023-07-10 ENCOUNTER — Encounter (HOSPITAL_COMMUNITY): Payer: Self-pay | Admitting: Psychiatry

## 2023-07-10 DIAGNOSIS — F319 Bipolar disorder, unspecified: Principal | ICD-10-CM | POA: Diagnosis present

## 2023-07-10 DIAGNOSIS — F3112 Bipolar disorder, current episode manic without psychotic features, moderate: Secondary | ICD-10-CM

## 2023-07-10 DIAGNOSIS — F312 Bipolar disorder, current episode manic severe with psychotic features: Secondary | ICD-10-CM | POA: Diagnosis present

## 2023-07-10 DIAGNOSIS — Z91148 Patient's other noncompliance with medication regimen for other reason: Secondary | ICD-10-CM

## 2023-07-10 DIAGNOSIS — Z7984 Long term (current) use of oral hypoglycemic drugs: Secondary | ICD-10-CM | POA: Diagnosis not present

## 2023-07-10 DIAGNOSIS — Z79899 Other long term (current) drug therapy: Secondary | ICD-10-CM | POA: Diagnosis not present

## 2023-07-10 DIAGNOSIS — Z91041 Radiographic dye allergy status: Secondary | ICD-10-CM | POA: Diagnosis not present

## 2023-07-10 DIAGNOSIS — Z882 Allergy status to sulfonamides status: Secondary | ICD-10-CM | POA: Diagnosis not present

## 2023-07-10 DIAGNOSIS — Z7985 Long-term (current) use of injectable non-insulin antidiabetic drugs: Secondary | ICD-10-CM

## 2023-07-10 DIAGNOSIS — F411 Generalized anxiety disorder: Secondary | ICD-10-CM | POA: Diagnosis present

## 2023-07-10 DIAGNOSIS — E119 Type 2 diabetes mellitus without complications: Secondary | ICD-10-CM | POA: Diagnosis present

## 2023-07-10 DIAGNOSIS — Z881 Allergy status to other antibiotic agents status: Secondary | ICD-10-CM | POA: Diagnosis not present

## 2023-07-10 DIAGNOSIS — Z833 Family history of diabetes mellitus: Secondary | ICD-10-CM | POA: Diagnosis not present

## 2023-07-10 DIAGNOSIS — Z888 Allergy status to other drugs, medicaments and biological substances status: Secondary | ICD-10-CM

## 2023-07-10 LAB — RAPID URINE DRUG SCREEN, HOSP PERFORMED
Amphetamines: NOT DETECTED
Barbiturates: NOT DETECTED
Benzodiazepines: NOT DETECTED
Cocaine: NOT DETECTED
Opiates: NOT DETECTED
Tetrahydrocannabinol: NOT DETECTED

## 2023-07-10 MED ORDER — ACETAMINOPHEN 325 MG PO TABS
650.0000 mg | ORAL_TABLET | Freq: Four times a day (QID) | ORAL | Status: DC | PRN
  Administered 2023-07-10: 650 mg via ORAL
  Filled 2023-07-10: qty 2

## 2023-07-10 MED ORDER — RISPERIDONE 0.5 MG PO TBDP: 1.0000 mg | ORAL_TABLET | Freq: Every day | ORAL | Status: DC

## 2023-07-10 MED ORDER — RISPERIDONE 1 MG PO TBDP
1.0000 mg | ORAL_TABLET | Freq: Every day | ORAL | Status: DC
  Administered 2023-07-10: 1 mg via ORAL
  Filled 2023-07-10: qty 1

## 2023-07-10 MED ORDER — HYDROXYZINE HCL 25 MG PO TABS
25.0000 mg | ORAL_TABLET | Freq: Three times a day (TID) | ORAL | Status: DC | PRN
  Filled 2023-07-10: qty 1

## 2023-07-10 MED ORDER — OLANZAPINE 5 MG PO TBDP: 5.0000 mg | ORAL_TABLET | Freq: Two times a day (BID) | ORAL | Status: DC | PRN

## 2023-07-10 MED ORDER — ALUM & MAG HYDROXIDE-SIMETH 200-200-20 MG/5ML PO SUSP: 30.0000 mL | ORAL | Status: DC | PRN

## 2023-07-10 MED ORDER — MAGNESIUM HYDROXIDE 400 MG/5ML PO SUSP: 30.0000 mL | Freq: Every day | ORAL | Status: DC | PRN

## 2023-07-10 MED ORDER — OLANZAPINE 10 MG IM SOLR: 10.0000 mg | Freq: Three times a day (TID) | INTRAMUSCULAR | Status: DC | PRN

## 2023-07-10 MED ORDER — OLANZAPINE 10 MG IM SOLR: 5.0000 mg | Freq: Three times a day (TID) | INTRAMUSCULAR | Status: DC | PRN

## 2023-07-10 MED ORDER — DIVALPROEX SODIUM ER 500 MG PO TB24
500.0000 mg | ORAL_TABLET | Freq: Every day | ORAL | Status: DC
  Administered 2023-07-10: 500 mg via ORAL
  Filled 2023-07-10: qty 1

## 2023-07-10 MED ORDER — DIVALPROEX SODIUM ER 500 MG PO TB24
500.0000 mg | ORAL_TABLET | Freq: Every day | ORAL | Status: DC
  Administered 2023-07-11: 500 mg via ORAL
  Filled 2023-07-10: qty 1

## 2023-07-10 MED ORDER — AMITRIPTYLINE HCL 10 MG PO TABS
10.0000 mg | ORAL_TABLET | Freq: Every day | ORAL | Status: DC
Start: 2023-07-10 — End: 2023-07-10

## 2023-07-10 MED ORDER — AMITRIPTYLINE HCL 10 MG PO TABS
10.0000 mg | ORAL_TABLET | Freq: Every day | ORAL | Status: DC
Start: 2023-07-10 — End: 2023-07-11
  Administered 2023-07-10: 10 mg via ORAL
  Filled 2023-07-10: qty 1

## 2023-07-10 MED ORDER — OLANZAPINE 5 MG PO TBDP: 5.0000 mg | ORAL_TABLET | Freq: Three times a day (TID) | ORAL | Status: DC | PRN

## 2023-07-10 MED ORDER — TRAZODONE HCL 50 MG PO TABS: 50.0000 mg | ORAL_TABLET | Freq: Every evening | ORAL | Status: DC | PRN
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
## 2023-07-10 NOTE — ED Notes (Signed)
Report given to [facility] at [facility].

## 2023-07-10 NOTE — Plan of Care (Signed)
  Problem: Safety: Goal: Periods of time without injury will increase Outcome: Progressing   Problem: Nutritional: Goal: Ability to achieve adequate nutritional intake will improve Outcome: Progressing

## 2023-07-10 NOTE — ED Provider Notes (Signed)
 Emergency Medicine Observation Re-evaluation Note  Laura Thompson is a 48 y.o. female, seen on rounds today.  Pt initially presented to the ED for complaints of Manic Behavior Currently, the patient is asleep.  Physical Exam  BP 124/71 (BP Location: Right Arm)   Pulse 97   Temp 97.6 F (36.4 C) (Oral)   Resp 18   SpO2 100%  Physical Exam General: No acute distress Cardiac: Regular rate Lungs: No respiratory distress Psych: Currently calm  ED Course / MDM  EKG:   I have reviewed the labs performed to date as well as medications administered while in observation.  Recent changes in the last 24 hours include -no new changes.  Patient is pending psychiatry evaluation and recommendations.  Plan  Current plan is for holding patient for psychiatric stabilization. Patient arrived to the ER yesterday with what appears to be manic episodes possible psychosis.   Deatra Face, MD 07/10/23 1020

## 2023-07-10 NOTE — Group Note (Signed)
 Date:  07/10/2023 Time:  9:09 PM  Group Topic/Focus:  Wrap-Up Group:   The focus of this group is to help patients review their daily goal of treatment and discuss progress on daily workbooks.    Participation Level:  Did Not Attend   Laura Thompson 07/10/2023, 9:09 PM
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
## 2023-07-10 NOTE — Progress Notes (Signed)
 Pt has been accepted to [facility] ON 07/10/2023 . Bed assignment: 503-1  Pt meets inpatient criteria per [name], PMHNP   Attending Physician will be Dr. Zouev   Report can be called to: Adult unit: [phone]  Pt can arrive after 3pm   Care Team Notified: [facility]  Lonn Roads, Jadeka Motley-Mangrum, PMHNP, Stacie Dys, RN

## 2023-07-10 NOTE — Tx Team (Signed)
 Initial Treatment Plan 07/10/2023 4:25 PM Laura Thompson ZOX:[id number]    PATIENT STRESSORS: Medication change or noncompliance   Occupational concerns     PATIENT STRENGTHS: Capable of independent living  Education administrator  Supportive family/friends  Work skills    PATIENT IDENTIFIED PROBLEMS: Acute psychosis (Paranoia)    Alterations in mood (labile-anger / agitation, threats)    Medication noncompliance "I stopped taking my medications in January of 2025"    Alteration in sleep pattern "My sleep is sporadic, I sleep 4-6 hours but I take naps".         DISCHARGE CRITERIA:  Improved stabilization in mood, thinking, and/or behavior Verbal commitment to aftercare and medication compliance  PRELIMINARY DISCHARGE PLAN: Outpatient therapy Return to previous work or school arrangements  PATIENT/FAMILY INVOLVEMENT: This treatment plan has been presented to and reviewed with the patient, Laura Thompson. The patient have been given the opportunity to ask questions and make suggestions.  Laura Auker, RN 07/10/2023, 4:25 PM

## 2023-07-11 MED ORDER — DIPHENHYDRAMINE HCL 50 MG/ML IJ SOLN: 25.0000 mg | Freq: Four times a day (QID) | INTRAMUSCULAR | Status: DC | PRN

## 2023-07-11 MED ORDER — POTASSIUM CHLORIDE CRYS ER 20 MEQ PO TBCR
40.0000 meq | EXTENDED_RELEASE_TABLET | Freq: Once | ORAL | Status: AC
  Administered 2023-07-11: 40 meq via ORAL
  Filled 2023-07-11: qty 2

## 2023-07-11 MED ORDER — RISPERIDONE 2 MG PO TBDP
2.0000 mg | ORAL_TABLET | Freq: Every day | ORAL | Status: DC
  Administered 2023-07-11 – 2023-07-14 (×4): 2 mg via ORAL
  Filled 2023-07-11 (×4): qty 1

## 2023-07-11 MED ORDER — POTASSIUM CHLORIDE CRYS ER 10 MEQ PO TBCR: 10.0000 meq | EXTENDED_RELEASE_TABLET | Freq: Two times a day (BID) | ORAL | Status: DC

## 2023-07-11 MED ORDER — RAMELTEON 8 MG PO TABS
8.0000 mg | ORAL_TABLET | Freq: Every day | ORAL | Status: DC
Start: 2023-07-11 — End: 2023-07-15
  Administered 2023-07-11 – 2023-07-14 (×4): 8 mg via ORAL
  Filled 2023-07-11 (×4): qty 1

## 2023-07-11 MED ORDER — DIPHENHYDRAMINE HCL 25 MG PO CAPS: 50.0000 mg | ORAL_CAPSULE | Freq: Four times a day (QID) | ORAL | Status: DC | PRN

## 2023-07-11 NOTE — BHH Counselor (Signed)
 Adult Comprehensive Assessment  Patient ID: Laura Thompson, female   DOB: 02-28-1975, 48 y.o.   MRN: [id number]  Information Source: Information source: Patient  Current Stressors:  Patient states their primary concerns and needs for treatment are:: I have bipolar disorder and I was manic Patient states their goals for this hospitilization and ongoing recovery are:: I want to go back to [facility] and get my shot once per month Educational / Learning stressors: denies Employment / Job issues: denies Family Relationships: denies Surveyor, quantity / Lack of resources (include bankruptcy): denies Housing / Lack of housing: denies Physical health (include injuries & life threatening diseases): denies Social relationships: denies Substance abuse: denies Bereavement / Loss: denies  Living/Environment/Situation:  Living Arrangements: Spouse/significant other Living conditions (as described by patient or guardian): good Who else lives in the home?: lives w/spouse How long has patient lived in current situation?: over 10 years What is atmosphere in current home: Loving  Family History:  Marital status: Married Number of Years Married: 21 What types of issues is patient dealing with in the relationship?: Patient states none What is your sexual orientation?: Heterosexual  Does patient have children?: Yes How many children?: 1 How is patient's relationship with their children?: excellent  Childhood History:  By whom was/is the patient raised?: Both parents Additional childhood history information: Parents divorced when pt was 38 years old, pt continued to live with mother until 27  Description of patient's relationship with caregiver when they were a child: Good with both parents Patient's description of current relationship with people who raised him/her: Currently patient has a good relationship with her mother. Father deceased. How were you disciplined when you got in trouble as a  child/adolescent?: they would talk to me Does patient have siblings?: No Did patient suffer any verbal/emotional/physical/sexual abuse as a child?: No Did patient suffer from severe childhood neglect?: No Has patient ever been sexually abused/assaulted/raped as an adolescent or adult?: No Was the patient ever a victim of a crime or a disaster?: No Witnessed domestic violence?: No Has patient been affected by domestic violence as an adult?: No  Education:  Highest grade of school patient has completed: Chief Operating Officer in Paramedic Relations Currently a student?: No Learning disability?: No  Employment/Work Situation:   Employment Situation: (P) Employed Where is Patient Currently Employed?: (P) [employer], [place] How Long has Patient Been Employed?: (P) At the current school for 2 years, 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?: (P) No  Financial Resources:   Surveyor, quantity resources: (P) Income from employment  Alcohol/Substance Abuse:   What has been your use of drugs/alcohol within the last 12 months?: (P) patient denies Alcohol/Substance Abuse Treatment Hx: (P) Denies past history  Social Support System:   Forensic psychologist System: (P) Good Describe Community Support System: (P) Husband, daughter, and mother Type of faith/religion: (P) Christian How does patient's faith help to cope with current illness?: (P) Because I read the word and I pray  Leisure/Recreation:   Do You Have Hobbies?: (P) Yes Leisure and Hobbies: (P) Shopping, going to the park, traveling  Strengths/Needs:   What is the patient's  perception of their  strengths?: (P) My communication skills Patient states they can use these personal strengths during their treatment to contribute to their recovery: (P) Because I have the ability to express myself and my needs and wants Patient states these barriers may affect/interfere with their treatment: (P) Patient denies any barriers Patient states these barriers may affect their return to the community: (P) Patient denies any barriers  Discharge Plan:   Currently receiving community mental health services: (P) No Patient states concerns and preferences for aftercare planning are: (P) Patient requests appointment with [facility] Patient states they will know when they are safe and ready for discharge when: (P) I feel like I am safe to go home now. I know what I need. Does patient have access to transportation?: (P) Yes (daughter) Does patient have financial barriers related to discharge medications?: (P) No Will patient be returning to same living situation after discharge?: (P) Yes  Summary/Recommendations:   Patient is a 48 year old married [place] [facility] She presents to the hospital Patient brought in by her husband for manic behavior. Patient's husband reports that patient has a history of bipolar disorder psychosis and mania. She has not been taking her medications. Recommendations include: crisis stabilization, therapeutic milieu, encourage group attendance and participation, medication management for detox/mood stabilization and development of comprehensive mental wellness/sobriety plan.     Darris Emery Khylan Sawyer. 07/11/2023

## 2023-07-11 NOTE — H&P (Addendum)
 Psychiatric Admission Assessment Adult  Patient Identification: Laura Thompson MRN:  [id number] Date of Evaluation:  07/11/2023 Chief Complaint:  Bipolar 1 disorder (HCC) [F31.9] Principal Diagnosis: Bipolar 1 disorder (HCC) Diagnosis:  Principal Problem:   Bipolar 1 disorder (HCC)  History of Present Illness: Laura Thompson is a 48 yo patient w/ a PPH of Bipolar d/o who presented to [facility] via her husband w/ concern for mania and IVC'd by husband. During ED time pt received IM Geodon  20mg  and Depakote  500mg . Pt transferred to [facility].   On assessment this AM pt is irritable. Pt was Aox4 and aware that she was admitted for manic episode; however pt endorsed that nothing was wrong with her currently. 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 Pt reports that she "needs" to be there for the children. Pt later endorsed that she feels like she is speaking calmly and therefore should be released and that "my husband is abusing the system" but also endorses that she feels supported by her husband. Pt goes on to report that she has not been working a few days in the last week " I just needed some time off." Pt reports that she was sleeping less and likely eating less over the last 2-3 days. Pt reports that she was also cursing at people. Pt endorses that she has been more irritable over the last few days. Pt denies financial extravagance and substance use. Pt reports she has not taken medication for her bipolar since 02/2023 because she felt like she did not need it.   Patient denies that she has been feeling dysphoric or hopeless. Pt reports that her energy level was great prior to 2-3 days ago and that she had good concentration at work. Pt denies SI, HI, and AVH.   Patient denies feeling nervous, anxious, worried, or overwhelmed.   Pt denies trauma hx.   Update: Pt seen again in the afternoon, pt continues to be resistant to  hospitalization and treatment. Pt tearful and labile.   Spoke w/ pt husband Laura Thompson,  Husband reports that he cannot recall the exact medications patient has done well on. He is aware that patient has not been taking her medications. Husband reports the the couple are still interested in having children if possible and she would likely not prefer risk associated with depakote  at this time. Husband reports that he has been talking with patient, and patient is ok with Risperdal  and is interested in LAI. Patient has told husband that she does not like PO but is open to a monthly injection. Husband reports that patient was not sleeping for 3-4 days, he endorses she would average 2 hours of sleep. Husband reports at baseline pt is very pleasant. Husband reports that the patient becomes irritable, talking to herself, and appeared paranoid. Husband denies that patient had VH. Husband reports that he was not concerned that she would hurt herself, but she was becoming more aggressive to others. Husband also reports that patient was suddenly hypersexual. Husband reports that patient speech was louder and pressured. Husband reports patient was listening to music she doesn't normally listen to and was upset if he asked her to remove her headphones, and liked to have them in constantly (which is abnormal).  Associated Signs/Symptoms: Depression Symptoms:  tearful at the thought of not going to work (Hypo) Manic Symptoms:  Grandiosity, Hallucinations, Impulsivity, Irritable Mood, Labiality of Mood, Sexually Inapproprite Behavior,  Anxiety Symptoms:  Excessive Worry, Psychotic Symptoms:  Hallucinations: Auditory per husband PTSD Symptoms: NA Total Time spent with patient: 1 hour  Past Psychiatric History:  Previous Psych Diagnoses: Bipolar 1 disorder Prior inpatient treatment:last in 02/2022  for mania under IVC, at least total of five admission per chart review. Pr endorses 6 in her life Current  Psychiatrist:  Last documented video visit with Todd Fossa, MD on 05/11/2021  History of suicide: Reported spinning a revolver and putting it to her head at the age of 61 or 37 History of homicide: Denies Psychiatric medication history: Abilify , lithium  450 mg, Risperdal  2 mg, Depakote , Zyprexa , trazodone , lamictal , seroquel , haldol , zyprexa  Pt reports that she did not think Abilify , Lithium , trazodone , not seroqel were helpful and would not take these medications. Pt endorsed feeling risperdal  as helpful and was willing to take this. Pt not really able to give true adverse reactions to these medications.  Psychiatric medication compliance history: Reported noncompliance  Is the patient at risk to self? No.  Has the patient been a risk to self in the past 6 months? No.  Has the patient been a risk to self within the distant past? Yes.    Is the patient a risk to others? Yes.    Has the patient been a risk to others in the past 6 months? No.  Has the patient been a risk to others within the distant past? Yes.     [place] Scale:  Flowsheet Row [facility] ([facility]) [facility] from 07/09/2023 [facility] Department at [facility] ED from 05/31/2023 in [facility] Emergency Department at [facility]  C-SSRS RISK CATEGORY No Risk No Risk No Risk        Prior Inpatient Therapy: Yes.     Prior Outpatient Therapy:Unknown  Alcohol Screening: 1. How often do you have a drink containing alcohol?: Never 2. How many drinks containing alcohol do you have on a typical day when you are drinking?: 1 or 2 3. How often do you have six or more drinks on one occasion?: Never AUDIT-C Score: 0 4. How often during the last year have you found that you were not able to stop drinking once you had started?: Never 5. How often during the last year have you failed to do what was normally expected from you because of drinking?:  Never 6. How often during the last year have you needed a first drink in the morning to get yourself going after a heavy drinking session?: Never 7. How often during the last year have you had a feeling of guilt of remorse after drinking?: Never 8. How often during the last year have you been unable to remember what happened the night before because you had been drinking?: Never 9. Have you or someone else been injured as a result of your drinking?: No 10. Has a relative or friend or a doctor or another health worker been concerned about your drinking or suggested you cut down?: No Alcohol Use Disorder Identification Test Final Score (AUDIT): 0 Alcohol Brief Interventions/Follow-up: Patient Refused Substance Abuse History in the last 12 months:  No. Consequences of Substance Abuse: NA Previous Psychotropic Medications: Yes  Psychological Evaluations: unknown Past Medical History:  Past Medical History:  Diagnosis Date   Bipolar 1 disorder (HCC)    Diabetes mellitus without complication (HCC)     Past Surgical History:  Procedure Laterality Date   CERVICAL ABLATION  DENTAL SURGERY     lipoma removal     Family History:  Family History  Problem Relation Age of Onset   Thyroid disease Mother    Hypertension Father    Diabetes Father    Glaucoma Father    Family Psychiatric  History: unknown Tobacco Screening:  Social History   Tobacco Use  Smoking Status Never  Smokeless Tobacco Never    BH Tobacco Counseling     Are you interested in Tobacco Cessation Medications?  N/A, patient does not use tobacco products Counseled patient on smoking cessation:  N/A, patient does not use tobacco products Reason Tobacco Screening Not Completed: No value filed.       Social History:  Social History   Substance and Sexual Activity  Alcohol Use No     Social History   Substance and Sexual Activity  Drug Use No    Additional Social History: Marital status: Married Number  of Years Married: 21 What types of issues is patient dealing with in the relationship?: Patient states none What is your sexual orientation?: Heterosexual  Does patient have children?: Yes How many children?: 1 How is patient's relationship with their children?: excellent                         Allergies:   Allergies  Allergen Reactions   Aripiprazole  Other (See Comments)    Manic-like Sx happened in March 2019   Diphenhydramine Hives   Doxycycline Hives   Iodinated Contrast Media Hives   Iodine Hives   Sulfa Antibiotics Hives   Metformin And Related Diarrhea   Lab Results:  Results for orders placed or performed during the hospital encounter of 07/09/23 (from the past 48 hours)  Comprehensive metabolic panel     Status: Abnormal   Collection Time: 07/09/23  9:17 PM  Result Value Ref Range   Sodium 134 (L) 135 - 145 mmol/L   Potassium 3.2 (L) 3.5 - 5.1 mmol/L   Chloride 102 98 - 111 mmol/L   CO2 22 22 - 32 mmol/L   Glucose, Bld 254 (H) 70 - 99 mg/dL    Comment: Glucose reference range applies only to samples taken after fasting for at least 8 hours.   BUN 16 6 - 20 mg/dL   Creatinine, Ser 9.60 0.44 - 1.00 mg/dL   Calcium  8.8 (L) 8.9 - 10.3 mg/dL   Total Protein 7.1 6.5 - 8.1 g/dL   Albumin 3.6 3.5 - 5.0 g/dL   AST 17 15 - 41 U/L   ALT 14 0 - 44 U/L   Alkaline Phosphatase 46 38 - 126 U/L   Total Bilirubin 0.8 0.0 - 1.2 mg/dL   GFR, Estimated >45 >40 mL/min    Comment: (NOTE) Calculated using the CKD-EPI Creatinine Equation (2021)    Anion gap 10 5 - 15    Comment: Performed at [facility], [address], [place], [place] [address]  Ethanol     Status: None   Collection Time: 07/09/23  9:17 PM  Result Value Ref Range   Alcohol, Ethyl (B) <15 <15 mg/dL    Comment: (NOTE) For medical purposes only. Performed at [facility], [address] [place], [place] [address]   cbc     Status: None   Collection Time:  07/09/23  9:17 PM  Result Value Ref Range   WBC 6.3 4.0 - 10.5 K/uL   RBC 4.44 3.87 - 5.11 MIL/uL  Hemoglobin 12.5 12.0 - 15.0 g/dL   HCT 56.4 33.2 - 95.1 %   MCV 85.6 80.0 - 100.0 fL   MCH 28.2 26.0 - 34.0 pg   MCHC 32.9 30.0 - 36.0 g/dL   RDW 88.4 16.6 - 06.3 %   Platelets 275 150 - 400 K/uL   nRBC 0.0 0.0 - 0.2 %    Comment: Performed at [facility], [address], [place], [place] [address]  hCG, serum, qualitative     Status: None   Collection Time: 07/09/23  9:17 PM  Result Value Ref Range   Preg, Serum NEGATIVE NEGATIVE    Comment:        THE SENSITIVITY OF THIS METHODOLOGY IS >10 mIU/mL. Performed at [facility], [address], [place], [place] [address]   Rapid urine drug screen (hospital performed)     Status: None   Collection Time: 07/10/23  1:46 AM  Result Value Ref Range   Opiates NONE DETECTED NONE DETECTED   Cocaine NONE DETECTED NONE DETECTED   Benzodiazepines NONE DETECTED NONE DETECTED   Amphetamines NONE DETECTED NONE DETECTED   Tetrahydrocannabinol NONE DETECTED NONE DETECTED   Barbiturates NONE DETECTED NONE DETECTED    Comment: (NOTE) DRUG SCREEN FOR MEDICAL PURPOSES ONLY.  IF CONFIRMATION IS NEEDED FOR ANY PURPOSE, NOTIFY LAB WITHIN 5 DAYS.  LOWEST DETECTABLE LIMITS FOR URINE DRUG SCREEN Drug Class                     Cutoff (ng/mL) Amphetamine and metabolites    1000 Barbiturate and metabolites    200 Benzodiazepine                 200 Opiates and metabolites        300 Cocaine and metabolites        300 THC                            50 Performed at [facility], [address], [place], [place] [address]     Blood Alcohol level:  Lab Results  Component Value Date   [facility] <15 07/09/2023   ETH <10 05/31/2023    Metabolic Disorder Labs:  Lab Results  Component Value Date   HGBA1C 7.0 (A) 12/02/2022   MPG 183 02/23/2022   MPG 186 02/22/2022   No results found for:  "PROLACTIN" Lab Results  Component Value Date   CHOL 224 (H) 02/22/2022   TRIG 22 02/22/2022   HDL 83 02/22/2022   CHOLHDL 2.7 02/22/2022   VLDL 4 02/22/2022   LDLCALC 137 (H) 02/22/2022   LDLCALC 153 (H) 03/12/2017    Current Medications: Current Facility-Administered Medications  Medication Dose Route Frequency Provider Last Rate Last Admin   acetaminophen  (TYLENOL ) tablet 650 mg  650 mg Oral Q6H PRN Motley-Mangrum, Jadeka A, PMHNP   650 mg at 07/10/23 2113   alum & mag hydroxide-simeth (MAALOX/MYLANTA) 200-200-20 MG/5ML suspension 30 mL  30 mL Oral Q4H PRN Motley-Mangrum, Jadeka A, PMHNP       amitriptyline  (ELAVIL ) tablet 10 mg  10 mg Oral QHS Motley-Mangrum, Jadeka A, PMHNP   10 mg at 07/10/23 2113   hydrOXYzine  (ATARAX ) tablet 25 mg  25 mg Oral TID PRN Motley-Mangrum, Jadeka A, PMHNP       magnesium  hydroxide (MILK OF MAGNESIA) suspension 30 mL  30 mL Oral Daily PRN Motley-Mangrum, Jadeka A, PMHNP  OLANZapine  (ZYPREXA ) injection 10 mg  10 mg Intramuscular TID PRN Motley-Mangrum, Jadeka A, PMHNP       OLANZapine  (ZYPREXA ) injection 5 mg  5 mg Intramuscular TID PRN Motley-Mangrum, Jadeka A, PMHNP       OLANZapine  zydis (ZYPREXA ) disintegrating tablet 5 mg  5 mg Oral BID PRN Motley-Mangrum, Jadeka A, PMHNP       OLANZapine  zydis (ZYPREXA ) disintegrating tablet 5 mg  5 mg Oral TID PRN Motley-Mangrum, Jadeka A, PMHNP       potassium chloride  SA (KLOR-CON  M) CR tablet 40 mEq  40 mEq Oral Once Kapil Petropoulos B, MD       ramelteon (ROZEREM) tablet 8 mg  8 mg Oral QHS Zaela Graley B, MD       risperiDONE  (RISPERDAL  M-TABS) disintegrating tablet 1 mg  1 mg Oral QHS Motley-Mangrum, Jadeka A, PMHNP   1 mg at 07/10/23 2113   PTA Medications: Medications Prior to Admission  Medication Sig Dispense Refill Last Dose/Taking   ascorbic acid (VITAMIN C ) 500 MG tablet Take 500 mg by mouth daily.   Past Week   Cholecalciferol  62.5 MCG (2500 UT) CAPS Take 5,000 Units by mouth daily.   Past Week    TURMERIC CURCUMIN PO Take by mouth.   Taking   zinc  gluconate 50 MG tablet Take 50 mg by mouth daily.   Past Week   TRULICITY  0.75 MG/0.5ML SOAJ ADMINISTER 0.75 MG UNDER THE SKIN 1 TIME A WEEK (Patient taking differently: Inject 0.75 mg into the skin every 14 (fourteen) days. Pt stated does not take weekly, usually every 2 wk) 2 mL 0     Musculoskeletal: Strength & Muscle Tone: within normal limits Gait & Station: remains seated on bed Patient leans: N/A            Psychiatric Specialty Exam:  Presentation  General Appearance:  Disheveled  Eye Contact: Fair  Speech: Pressured; Clear and Coherent  Speech Volume: Normal  Handedness:No data recorded  Mood and Affect  Mood: Irritable; Anxious  Affect: Labile   Thought Process  Thought Processes: Goal Directed   Past Diagnosis of Schizophrenia or Psychoactive disorder: No data recorded Descriptions of Associations:Intact  Orientation:Full (Time, Place and Person)  Thought Content:Illogical; Rumination  Hallucinations:Hallucinations: None  Ideas of Reference:None  Suicidal Thoughts:Suicidal Thoughts: No  Homicidal Thoughts:Homicidal Thoughts: No   Sensorium  Memory: Immediate Fair; Recent Good  Judgment: Impaired  Insight: None   Executive Functions  Concentration: Poor  Attention Span: Poor  Recall: Fair  Fund of Knowledge: Fair  Language: Good   Psychomotor Activity  Psychomotor Activity: Psychomotor Activity: Normal   Assets  Assets: Desire for Improvement; Resilience; Social Support; Housing; Health and safety inspector; Communication Skills   Sleep  Sleep: Sleep: Poor    Physical Exam: Physical Exam HENT:     Head: Normocephalic and atraumatic.  Pulmonary:     Effort: Pulmonary effort is normal.  Neurological:     Mental Status: She is alert and oriented to person, place, and time.    Review of Systems  Psychiatric/Behavioral:  Negative for  substance abuse and suicidal ideas. The patient is nervous/anxious and has insomnia.    Blood pressure 109/77, pulse 99, temperature 99 F (37.2 C), temperature source Oral, resp. rate 18, height 5\' 6"  (1.676 m), weight 75.3 kg, SpO2 100%. Body mass index is 26.79 kg/m.  Treatment Plan Summary: Daily contact with patient to assess and evaluate symptoms and progress in treatment and Medication management  Observation Level/Precautions:  15  minute checks  Laboratory:  CBC: WNL, Uds: (-), Etoh: (-), hcG: (-), Etoh: (-) CMP Na 134 and K+ 3.2  Psychotherapy:    Medications:    Consultations:    Discharge Concerns:    Estimated LOS:  Other:     Pt continues to be irritable but responded positively to IM Geodon  and Depakote  from ED. Currently pt is ruminating on discharge and not really open to medications or discussion, but pt is willing to be compliant with risperdal . Pt is also labile. Pt endorses failing multiple medication and not wanting to take them. Pt continues to agitated with staff. Husband advocated that patient would like not want to be on depakote  at baseline. Discussed with husband will dc for now, but if pt does not have improvement and is not able willing to take Lithium  or Lamictal  may have to restart depakote . Husband endorsed understanding. No evidence to suggest that all of the medications pt listed were true failures; however giving hx of poor compliance LAI would be best option. SGA is also needed to address reported AH and paranoia.  Bipolar 1 d/o, current episode manic - Start Risperdal  2mg  at bedtime - D'c Depakote  500mg  daily from ED - D'c elavil  from ED - ramelteon 8mg  at bedtime  PRN -Tylenol  650mg  q6h, pain -Maalox 30ml q4h, indigestion -Atarax  25mg  TID, anxiety -Milk of Mag 30mL, constipation -Trazodone  50mg  QHS, insomnia   Agitation Protocol Zyprexa  5mg  TID PO PRN  OR Zyprexa  5mg  IM TID PRN AND/OR     Hypokalemia - 40mEq K+ - repeat CMP   Safety  and Monitoring: Involuntary admission to inpatient psychiatric unit for safety, stabilization and treatment Daily contact with patient to assess and evaluate symptoms and progress in treatment Patient's case to be discussed in multi-disciplinary team meeting Observation Level : q15 minute checks Vital signs: q12 hours Precautions: suicide, but pt currently verbally contracts for safety on unit    Discharge Planning: Social work and case management to assist with discharge planning and identification of hospital follow-up needs prior to discharge Estimated LOS: 5-7 days Discharge Concerns: Need to establish a safety plan; Medication compliance and effectiveness Discharge Goals: Return home with outpatient referrals for mental health follow-up including medication management/psychotherapy.     Physician Treatment Plan for Primary Diagnosis: Bipolar 1 disorder (HCC) Long Term Goal(s): Improvement in symptoms so as ready for discharge  Short Term Goals: Ability to identify changes in lifestyle to reduce recurrence of condition will improve, Ability to verbalize feelings will improve, Ability to disclose and discuss suicidal ideas, Ability to demonstrate self-control will improve, Ability to identify and develop effective coping behaviors will improve, Ability to maintain clinical measurements within normal limits will improve, Compliance with prescribed medications will improve, and Ability to identify triggers associated with substance abuse/mental health issues will improve  Physician Treatment Plan for Secondary Diagnosis: Principal Problem:   Bipolar 1 disorder (HCC)  Long Term Goal(s): Improvement in symptoms so as ready for discharge  Short Term Goals: Ability to identify changes in lifestyle to reduce recurrence of condition will improve, Ability to demonstrate self-control will improve, Ability to maintain clinical measurements within normal limits will improve, and Compliance with  prescribed medications will improve    I certify that inpatient services furnished can reasonably be expected to improve the patient's condition.    Brent Noto B Hilery Wintle, MD 5/24/20251:54 PM

## 2023-07-11 NOTE — Plan of Care (Signed)
  Problem: Safety: Goal: Ability to demonstrate self-control will improve Outcome: Progressing   

## 2023-07-11 NOTE — [facility] Group Notes (Signed)
 Adult Psychoeducational Group Note  Date:  07/11/2023 Time:  6:45 PM  Group Topic/Focus:  Goals Group:   The focus of this group is to help patients establish daily goals to achieve during treatment and discuss how the patient can incorporate goal setting into their daily lives to aide in recovery. Orientation:   The focus of this group is to educate the patient on the purpose and policies of crisis stabilization and provide a format to answer questions about their admission.  The group details unit policies and expectations of patients while admitted.  Participation Level:  Did Not Attend  Participation Quality:    Affect:    Cognitive:    Insight:   Engagement in Group:    Modes of Intervention:    Additional Comments:    Ancel Baltimore 07/11/2023, 6:45 PM

## 2023-07-11 NOTE — Progress Notes (Signed)
   07/10/23 2115  Psych Admission Type (Psych Patients Only)  Admission Status Involuntary  Psychosocial Assessment  Patient Complaints None  Eye Contact Fair  Facial Expression Anxious  Affect Anxious  Speech Logical/coherent  Interaction Assertive  Motor Activity Restless  Appearance/Hygiene Meticulous  Behavior Characteristics Appropriate to situation  Mood Anxious;Pleasant  Thought Process  Coherency Circumstantial  Content WDL  Delusions None reported or observed  Perception WDL  Hallucination None reported or observed  Judgment Poor  Confusion None  Danger to Self  Current suicidal ideation? Denies

## 2023-07-11 NOTE — Plan of Care (Addendum)
 Pt noted in bed on initial contact. Presents with blunted affect, irritable mood, suspicious, labile and oppositional, demanding on interactions. Denies SI, HI, AVH and pain when assessed "No", very dismissive with staff. Required multiple prompts to come take her potassium "Well, I forgot. Did you clean the counter? Why you not waring gloves. What will happen if I don't take it any ways. I need to be d/c now. I need to talk to your supervisor and your doctor. I need to go home now". Pt continues to require multiple redirections to comply with care on unit. Pt did not attend scheduled groups despite multiple prompts. Off unit for meals. Tolerates meals and fluids well. Safety checks maintained at Q 15 minutes intervals without incident.  Problem: Activity: Goal: Sleeping patterns will improve Outcome: Progressing   Problem: Safety: Goal: Periods of time without injury will increase Outcome: Progressing   Problem: Education: Goal: Emotional status will improve Outcome: Not Progressing   Problem: Activity: Goal: Interest or engagement in activities will improve Outcome: Not Progressing

## 2023-07-11 NOTE — [facility] Suicide Risk Assessment (Signed)
 [facility] INPATIENT:  Family/Significant Other Suicide Prevention Education  Suicide Prevention Education:  Education Completed; 07/11/2023 Laura Thompson, husband) has been identified by the patient as the family member/significant other with whom the patient will be residing, and identified as the person(s) who will aid the patient in the event of a mental health crisis (suicidal ideations/suicide attempt).  With written consent from the patient, the family member/significant other has been provided the following suicide prevention education, prior to the and/or following the discharge of the patient.  The suicide prevention education provided includes the following: Suicide risk factors Suicide prevention and interventions National Suicide Hotline telephone number [facility] assessment telephone number [facility] Emergency Assistance 911 [facility] and/or Residential Mobile Crisis Unit telephone number  Request made of family/significant other to: Remove weapons (e.g., guns, rifles, knives), all items previously/currently identified as safety concern.   Remove drugs/medications (over-the-counter, prescriptions, illicit drugs), all items previously/currently identified as a safety concern.  The family member/significant other verbalizes understanding of the suicide prevention education information provided.  The family member/significant other agrees to remove the items of safety concern listed above.  Laura Thompson 07/11/2023, 2:28 PM

## 2023-07-11 NOTE — Group Note (Signed)
 Date:  07/11/2023 Time:  8:57 PM  Group Topic/Focus:  Wrap-Up Group:   The focus of this group is to help patients review their daily goal of treatment and discuss progress on daily workbooks.    Participation Level:  Did Not Attend  Taliah Porche Dacosta 07/11/2023, 8:57 PM

## 2023-07-11 NOTE — BHH Suicide Risk Assessment (Addendum)
 [facility] Admission Suicide Risk Assessment   Nursing information obtained from:  Patient Demographic factors:  Low socioeconomic status Current Mental Status:  NA Loss Factors:  NA Historical Factors:  NA Risk Reduction Factors:  Living with another person, especially a relative  Total Time spent with patient: 45 minutes Principal Problem: Bipolar 1 disorder (HCC) Diagnosis:  Principal Problem:   Bipolar 1 disorder (HCC)  Subjective Data:  Laura Thompson is a 48 yo patient w/ a PPH of Bipolar d/o who presented to [facility] via her husband w/ concern for mania and IVC'd by husband. During ED time pt received IM Geodon  20mg  and Depakote  500mg . Pt transferred to [facility].   On assessment this AM pt is irritable. Pt was Aox4 and aware that she was admitted for manic episode; however pt endorsed that nothing was wrong with her currently. 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 Pt reports that she "needs" to be there for the children. Pt later endorsed that she feels like she is speaking calmly and therefore should be released and that "my husband is abusing the system" but also endorses that she feels supported by her husband. Pt goes on to report that she has not been working a few days in the last week " I just needed some time off." Pt reports that she was sleeping less and likely eating less over the last 2-3 days. Pt reports that she was also cursing at people. Pt endorses that she has been more irritable over the last few days. Pt denies financial extravagance and substance use. Pt reports she has not taken medication for her bipolar since 02/2023 because she felt like she did not need it.   Patient denies that she has been feeling dysphoric or hopeless. Pt reports that her energy level was great prior to 2-3 days ago and that she had good concentration at work. Pt denies SI, HI, and AVH.   Patient denies feeling nervous, anxious,  worried, or overwhelmed.   Pt denies trauma hx.   Spoke w/ pt husband Sherley Mckenney,  Husband reports that he cannot recall the exact medications patient has done well on. He is aware that patient has not been taking her medications. Husband reports the the couple are still interested in having children if possible and she would likely not prefer risk associated with depakote  at this time. Husband reports that he has been talking with patient, and patient is ok with Risperdal  and is interested in LAI. Patient has told husband that she does not like PO but is open to a monthly injection. Husband reports that patient was not sleeping for 3-4 days, he endorses she would average 2 hours of sleep. Husband reports at baseline pt is very pleasant. Husband reports that the patient becomes irritable, talking to herself, and appeared paranoid. Husband denies that patient had VH. Husband reports that he was not concerned that she would hurt herself, but she was becoming more aggressive to others. Husband also reports that patient was suddenly hypersexual. Husband reports that patient speech was louder and pressured. Husband reports patient was listening to music she doesn't normally listen to and was upset if he asked her to remove her headphones, and liked to have them in constantly (which is abnormal).  Continued Clinical Symptoms:  Alcohol Use Disorder Identification Test Final Score (AUDIT): 0 The "Alcohol Use Disorders Identification Test", Guidelines for Use in Primary Care, Second  Edition.  World Science writer [facility]). Score between 0-7:  no or low risk or alcohol related problems. Score between 8-15:  moderate risk of alcohol related problems. Score between 16-19:  high risk of alcohol related problems. Score 20 or above:  warrants further diagnostic evaluation for alcohol dependence and treatment.   CLINICAL FACTORS:   Bipolar d/o: manic   Musculoskeletal: Strength & Muscle Tone: within  normal limits Gait & Station: remains sitting in bed Patient leans: N/A  Psychiatric Specialty Exam:  Presentation  General Appearance:  Disheveled  Eye Contact: Fair  Speech: Pressured; Clear and Coherent  Speech Volume: Normal  Handedness:No data recorded  Mood and Affect  Mood: Irritable; Anxious  Affect: Labile   Thought Process  Thought Processes: Goal Directed  Descriptions of Associations:Intact  Orientation:Full (Time, Place and Person)  Thought Content:Illogical; Rumination  History of Schizophrenia/Schizoaffective disorder:No data recorded Duration of Psychotic Symptoms:No data recorded Hallucinations:Hallucinations: None  Ideas of Reference:None  Suicidal Thoughts:Suicidal Thoughts: No  Homicidal Thoughts:Homicidal Thoughts: No   Sensorium  Memory: Immediate Fair; Recent Good  Judgment: Impaired  Insight: None   Executive Functions  Concentration: Poor  Attention Span: Poor  Recall: Fair  Fund of Knowledge: Fair  Language: Good   Psychomotor Activity  Psychomotor Activity: Psychomotor Activity: Normal   Assets  Assets: Desire for Improvement; Resilience; Social Support; Housing; Health and safety inspector; Communication Skills   Sleep  Sleep: Sleep: Poor    Physical Exam: Physical Exam HENT:     Head: Normocephalic and atraumatic.  Pulmonary:     Effort: Pulmonary effort is normal.  Neurological:     Mental Status: She is alert and oriented to person, place, and time.    ROS Blood pressure 109/77, pulse 99, temperature 99 F (37.2 C), temperature source Oral, resp. rate 18, height 5\' 6"  (1.676 m), weight 75.3 kg, SpO2 100%. Body mass index is 26.79 kg/m.   COGNITIVE FEATURES THAT CONTRIBUTE TO RISK:  Closed-mindedness    SUICIDE RISK:   Mild:  Suicidal ideation of limited frequency, intensity, duration, and specificity.  There are no identifiable plans, no associated intent, mild dysphoria  and related symptoms, good self-control (both objective and subjective assessment), few other risk factors, and identifiable protective factors, including available and accessible social support.  PLAN OF CARE:  Bipolar 1 d/o, current episode manic - Start Risperdal  2mg  at bedtime - D'c Depakote  500mg  daily from ED - D'c elavil  from ED - ramelteon 8mg  at bedtime - continue IVC   PRN -Tylenol  650mg  q6h, pain -Maalox 30ml q4h, indigestion -Atarax  25mg  TID, anxiety -Milk of Mag 30mL, constipation -Trazodone  50mg  QHS, insomnia    Agitation Protocol Zyprexa  5mg  TID PO PRN  OR Zyprexa  5mg  IM TID PRN AND/OR        Hypokalemia - 40mEq K+ - repeat CMP     Safety and Monitoring: Involuntary admission to inpatient psychiatric unit for safety, stabilization and treatment Daily contact with patient to assess and evaluate symptoms and progress in treatment Patient's case to be discussed in multi-disciplinary team meeting Observation Level : q15 minute checks Vital signs: q12 hours Precautions: suicide, but pt currently verbally contracts for safety on unit    Discharge Planning: Social work and case management to assist with discharge planning and identification of hospital follow-up needs prior to discharge Estimated LOS: 5-7 days Discharge Concerns: Need to establish a safety plan; Medication compliance and effectiveness Discharge Goals: Return home with outpatient referrals for mental health follow-up including medication management/psychotherapy.  Physician Treatment Plan for Primary Diagnosis: Bipolar 1 disorder (HCC) Long Term Goal(s): Improvement in symptoms so as ready for discharge   Short Term Goals: Ability to identify changes in lifestyle to reduce recurrence of condition will improve, Ability to verbalize feelings will improve, Ability to disclose and discuss suicidal ideas, Ability to demonstrate self-control will improve, Ability to identify and develop effective  coping behaviors will improve, Ability to maintain clinical measurements within normal limits will improve, Compliance with prescribed medications will improve, and Ability to identify triggers associated with substance abuse/mental health issues will improve   Physician Treatment Plan for Secondary Diagnosis: Principal Problem:   Bipolar 1 disorder (HCC)   Long Term Goal(s): Improvement in symptoms so as ready for discharge   Short Term Goals: Ability to identify changes in lifestyle to reduce recurrence of condition will improve, Ability to demonstrate self-control will improve, Ability to maintain clinical measurements within normal limits will improve, and Compliance with prescribed medications will improve   I certify that inpatient services furnished can reasonably be expected to improve the patient's condition.   Ved Martos B Precious Gilchrest, MD 07/11/2023, 1:53 PM

## 2023-07-11 NOTE — Hospital Course (Addendum)
 Laura Thompson

## 2023-07-11 NOTE — BHH Counselor (Signed)
 Adult Comprehensive Assessment  Patient ID: Laura Thompson, female   DOB: Mar 08, 1975, 48 y.o.   MRN: [id number]  Information Source: Information source: Patient  Current Stressors:  Patient states their primary concerns and needs for treatment are:: I have bipolar disorder and I was manic Patient states their goals for this hospitilization and ongoing recovery are:: I [facility] and get my shot once per month Educational / Learning stressors: denies Employment / Job issues: denies Family Relationships: denies Surveyor, quantity / Lack of resources (include bankruptcy): denies Housing / Lack of housing: denies Physical health (include injuries & life threatening diseases): denies Social relationships: denies Substance abuse: denies Bereavement / Loss: denies  Living/Environment/Situation:  Living Arrangements: Spouse/significant other Living conditions (as described by patient or guardian): good Who else lives in the home?: lives w/spouse How long has patient lived in current situation?: over 10 years What is atmosphere in current home: Loving  Family History:  Marital status: Married Number of Years Married: 21 What types of issues is patient dealing with in the relationship?: Patient states none What is your sexual orientation?: Heterosexual  Does patient have children?: Yes How many children?: 1 How is patient's relationship with their children?: excellent  Childhood History:  By whom was/is the patient raised?: Both parents Additional childhood history information: Parents divorced when pt was 19 years old, pt continued to live with mother until 34  Description of patient's relationship with caregiver when they were a child: Good with both parents Patient's description of current relationship with people who raised him/her: Currently patient has a good relationship with her mother. Father deceased. How were you disciplined when you got in trouble as a  child/adolescent?: they would talk to me Does patient have siblings?: No Did patient suffer any verbal/emotional/physical/sexual abuse as a child?: No Did patient suffer from severe childhood neglect?: No Has patient ever been sexually abused/assaulted/raped as an adolescent or adult?: No Was the patient ever a victim of a crime or a disaster?: No Witnessed domestic violence?: No Has patient been affected by domestic violence as an adult?: No  Education:  Highest grade of school patient has completed: Chief Operating Officer in Paramedic Relations Currently a student?: No Learning disability?: No  Employment/Work Situation:   Employment Situation: (P) Employed Where is Patient Currently Employed?: (P) [employer], [place] How Long has Patient Been Employed?: (P) At the current school for 2 years, 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?: (P) No  Financial Resources:   Surveyor, quantity resources: (P) Income from employment  Alcohol/Substance Abuse:   What has been your use of drugs/alcohol within the last 12 months?: (P) patient denies Alcohol/Substance Abuse Treatment Hx: (P) Denies past history  Social Support System:   Forensic psychologist System: (P) Good Describe Community Support System: (P) Husband, daughter, and mother Type of faith/religion: (P) Christian How does patient's faith help to cope with current illness?: (P) Because I read the word and I pray  Leisure/Recreation:   Do You Have Hobbies?: (P) Yes Leisure and Hobbies: (P) Shopping, going to the park, traveling  Strengths/Needs:   What is the patient's  perception of their  strengths?: (P) My communication skills Patient states they can use these personal strengths during their treatment to contribute to their recovery: (P) Because I have the ability to express myself and my needs and wants Patient states these barriers may affect/interfere with their treatment: (P) Patient denies any barriers Patient states these barriers may affect their return to the community: (P) Patient denies any barriers  Discharge Plan:   Currently receiving community mental health services: (P) No Patient states concerns and preferences for aftercare planning are: (P) Patient requests appointment with [facility] Patient states they will know when they are safe and ready for discharge when: (P) I feel like I am safe to go home now. I know what I need. Does patient have access to transportation?: (P) Yes (daughter) Does patient have financial barriers related to discharge medications?: (P) No Will patient be returning to same living situation after discharge?: (P) Yes  Summary/Recommendations:   Summary and Recommendations (to be completed by the evaluator): (P) . Patient is a 49 year old  married female  [place]   She was brought in by her husband for manic behavior. Patient's husband reports that patient has a history of bipolar disorder psychosis and mania. She has not been taking her medications. He reports that she has these episodes when she does not take her medications. Pt has a history of bipolar disorder with mania and psychosis. Patient has had previous hospitalizations. She is requesting follow-up with [facility] and to begin medication by injection 1x per month post discharge.   Recommendations include: crisis stabilization, therapeutic milieu, encourage group attendance and participation, medication management for detox/mood stabilization and development of comprehensive mental wellness/sobriety plan.     Darris Emery Foye Haggart. 07/11/2023

## 2023-07-12 LAB — COMPREHENSIVE METABOLIC PANEL WITH GFR
ALT: 14 U/L (ref 0–44)
AST: 16 U/L (ref 15–41)
Albumin: 3.4 g/dL — ABNORMAL LOW (ref 3.5–5.0)
Alkaline Phosphatase: 42 U/L (ref 38–126)
Anion gap: 6 (ref 5–15)
BUN: 13 mg/dL (ref 6–20)
CO2: 25 mmol/L (ref 22–32)
Calcium: 8.4 mg/dL — ABNORMAL LOW (ref 8.9–10.3)
Chloride: 103 mmol/L (ref 98–111)
Creatinine, Ser: 0.58 mg/dL (ref 0.44–1.00)
GFR, Estimated: >60 mL/min (ref >60)
Glucose, Bld: 195 mg/dL — ABNORMAL HIGH (ref 70–99)
Potassium: 4.3 mmol/L (ref 3.5–5.1)
Sodium: 134 mmol/L — ABNORMAL LOW (ref 135–145)
Total Bilirubin: 0.5 mg/dL (ref 0.0–1.2)
Total Protein: 6.7 g/dL (ref 6.5–8.1)

## 2023-07-12 MED ORDER — CYCLOBENZAPRINE HCL 10 MG PO TABS
10.0000 mg | ORAL_TABLET | Freq: Three times a day (TID) | ORAL | Status: DC | PRN
Start: 2023-07-12 — End: 2023-07-15
  Administered 2023-07-12: 10 mg via ORAL
  Filled 2023-07-12: qty 2

## 2023-07-12 MED ORDER — RISPERIDONE 1 MG PO TBDP
1.0000 mg | ORAL_TABLET | Freq: Every day | ORAL | Status: DC
  Administered 2023-07-12 – 2023-07-15 (×4): 1 mg via ORAL
  Filled 2023-07-12 (×4): qty 1

## 2023-07-12 NOTE — Progress Notes (Signed)
 [facility] MD Progress Note  07/12/2023 9:28 AM Laura Thompson  MRN:  [id number] Subjective:  Laura Thompson is a 48 yo patient w/ a PPH of Bipolar d/o who presented to [facility] via her husband w/ concern for mania and IVC'd by husband. During ED time pt received IM Geodon  20mg  and Depakote  500mg . Pt transferred to [facility].   RN update- Pt continues to be irritable and resistant to treatment. Pt is compliant with medications. Pt not attending groups.   Upon assessment this AM pt was noted to be attending a group. Pt endorsed that she slept well last night. Pt reports that she wants to leave today or tomorrow and continues to endorse that she must attend Field Day. Pt reports that she is angry with her hospitalization and reports that she does not need to be hospitalized. Pt reports that she does not have Bipolar disorder. Pt reports she only said she had this yesterday to get out of the hospital. Pt was noted to have na open bible in her room. When asked what she read and what it was about pt reports that she cannot understand and goes off on a tangent about different types of bibles and endorses that she is having some difficulty understanding things right now "because I can't get out of my head and where I am now." Pt reports that she has been having some issues with focus. Pt reports that she is very upset and wants provider to call her daughter. Pt denies SI, Hi, and AVH. Pt denies feeling paranoid for her safety. Pt endorses that he appetite is good and denies feeling anxious.   There was a long discussion with pt about benefits of a mood stabilizer. Discussed with pt BB warning for depakote . Pt perseverated on discharging tom. Pt refused and perseverated on LAI form of risperdal  and endorsed that she would not take medications that had doses higher than 10mg . Pt was willing to take more risperdal .  Daughter- Laura Thompson [phone]  Daughter reports that she feels her mother is doing worse than  previous episodes. Daughter reports that pt was in the hospital in 05/2023. Daughter reports that her mother's behavior has been more extreme and agitated to others. Daughter reports that patient paranoia and AH that dad reported are new to her mania presentation. Daughter reports in relation to pt being upset about missing Field Day, she has talked to pt about accepting hospitalization and understands the importance of the event. Daughter reports she is having difficulty getting pt to accept hospitalization.   Principal Problem: Bipolar 1 disorder (HCC) Diagnosis: Principal Problem:   Bipolar 1 disorder (HCC)  Total Time spent with patient: 30 minutes  Past Psychiatric History:  Previous Psych Diagnoses: Bipolar 1 disorder Prior inpatient treatment:last in 02/2022  for mania under IVC, at least total of five admission per chart review. Pr endorses 6 in her life Current Psychiatrist:  Last documented video visit with Todd Fossa, MD on 05/11/2021  History of suicide: Reported spinning a revolver and putting it to her head at the age of 87 or 60 History of homicide: Denies Psychiatric medication history: Abilify , lithium  450 mg, Risperdal  2 mg, Depakote , Zyprexa , trazodone , lamictal , seroquel , haldol , zyprexa  Pt reports that she did not think Abilify , Lithium , trazodone , not seroqel were helpful and would not take these medications. Pt endorsed feeling risperdal  as helpful and was willing to take this. Pt not really able to give true adverse reactions to these medications.   Psychiatric medication compliance history: Reported  noncompliance    Past Medical History:  Past Medical History:  Diagnosis Date   Bipolar 1 disorder (HCC)    Diabetes mellitus without complication (HCC)     Past Surgical History:  Procedure Laterality Date   CERVICAL ABLATION     DENTAL SURGERY     lipoma removal     Family History:  Family History  Problem Relation Age of Onset   Thyroid disease Mother     Hypertension Father    Diabetes Father    Glaucoma Father    Family Psychiatric  History: unknown  Social History:  Social History   Substance and Sexual Activity  Alcohol Use No     Social History   Substance and Sexual Activity  Drug Use No    Social History   Socioeconomic History   Marital status: Married    Spouse name: Not on file   Number of children: Not on file   Years of education: Not on file   Highest education level: Not on file  Occupational History   Not on file  Tobacco Use   Smoking status: Never   Smokeless tobacco: Never  Vaping Use   Vaping status: Never Used  Substance and Sexual Activity   Alcohol use: No   Drug use: No   Sexual activity: Yes  Other Topics Concern   Not on file  Social History Narrative   Not on file   Social Drivers of Health   Financial Resource Strain: Low Risk  (05/30/2022)   Received from [facility]   Overall Financial Resource Strain (CARDIA)    Difficulty of Paying Living Expenses: Not very hard  Food Insecurity: No Food Insecurity (07/10/2023)   Hunger Vital Sign    Worried About Running Out of Food in the Last Year: Never true    Ran Out of Food in the Last Year: Never true  Transportation Needs: No Transportation Needs (07/10/2023)   PRAPARE - Administrator, Civil Service (Medical): No    Lack of Transportation (Non-Medical): No  Physical Activity: Insufficiently Active (05/30/2022)   Received from [facility]   Exercise Vital Sign    Days of Exercise per Week: 2 days    Minutes of Exercise per Session: 50 min  Stress: No Stress Concern Present (05/30/2022)   Received from [facility] - Occupational Stress Questionnaire    Feeling of Stress : Not at all  Social Connections: Socially Integrated (05/30/2022)   Received from [facility]   Social Network    How would you rate your social network (family, work, friends)?: Good participation with  social networks   Additional Social History:                         Sleep: Fair  Appetite:  Good  Current Medications: Current Facility-Administered Medications  Medication Dose Route Frequency Provider Last Rate Last Admin   acetaminophen  (TYLENOL ) tablet 650 mg  650 mg Oral Q6H PRN Motley-Mangrum, Jadeka A, PMHNP   650 mg at 07/10/23 2113   alum & mag hydroxide-simeth (MAALOX/MYLANTA) 200-200-20 MG/5ML suspension 30 mL  30 mL Oral Q4H PRN Motley-Mangrum, Jadeka A, PMHNP       cyclobenzaprine  (FLEXERIL ) tablet 10 mg  10 mg Oral TID PRN Alfonsa Vaile B, MD       hydrOXYzine  (ATARAX ) tablet 25 mg  25 mg Oral TID PRN Motley-Mangrum, Jadeka A, PMHNP  magnesium  hydroxide (MILK OF MAGNESIA) suspension 30 mL  30 mL Oral Daily PRN Motley-Mangrum, Jadeka A, PMHNP       OLANZapine  (ZYPREXA ) injection 10 mg  10 mg Intramuscular TID PRN Motley-Mangrum, Jadeka A, PMHNP       OLANZapine  (ZYPREXA ) injection 5 mg  5 mg Intramuscular TID PRN Motley-Mangrum, Jadeka A, PMHNP       OLANZapine  zydis (ZYPREXA ) disintegrating tablet 5 mg  5 mg Oral TID PRN Motley-Mangrum, Jadeka A, PMHNP       ramelteon (ROZEREM) tablet 8 mg  8 mg Oral QHS Sorin Frimpong B, MD   8 mg at 07/11/23 2050   risperiDONE  (RISPERDAL  M-TABS) disintegrating tablet 1 mg  1 mg Oral Daily Jevonte Clanton B, MD       risperiDONE  (RISPERDAL  M-TABS) disintegrating tablet 2 mg  2 mg Oral QHS Asaf Elmquist B, MD   2 mg at 07/11/23 2050    Lab Results:  Results for orders placed or performed during the hospital encounter of 07/10/23 (from the past 48 hours)  Comprehensive metabolic panel     Status: Abnormal   Collection Time: 07/12/23  6:28 AM  Result Value Ref Range   Sodium 134 (L) 135 - 145 mmol/L   Potassium 4.3 3.5 - 5.1 mmol/L   Chloride 103 98 - 111 mmol/L   CO2 25 22 - 32 mmol/L   Glucose, Bld 195 (H) 70 - 99 mg/dL    Comment: Glucose reference range applies only to samples taken after fasting for at least 8 hours.    BUN 13 6 - 20 mg/dL   Creatinine, Ser 2.13 0.44 - 1.00 mg/dL   Calcium  8.4 (L) 8.9 - 10.3 mg/dL   Total Protein 6.7 6.5 - 8.1 g/dL   Albumin 3.4 (L) 3.5 - 5.0 g/dL   AST 16 15 - 41 U/L   ALT 14 0 - 44 U/L   Alkaline Phosphatase 42 38 - 126 U/L   Total Bilirubin 0.5 0.0 - 1.2 mg/dL   GFR, Estimated >08 >65 mL/min    Comment: (NOTE) Calculated using the CKD-EPI Creatinine Equation (2021)    Anion gap 6 5 - 15    Comment: Performed at [facility], [address], [place], [place] [address]    Blood Alcohol level:  Lab Results  Component Value Date   [facility] <15 07/09/2023   ETH <10 05/31/2023    Metabolic Disorder Labs: Lab Results  Component Value Date   HGBA1C 7.0 (A) 12/02/2022   MPG 183 02/23/2022   MPG 186 02/22/2022   No results found for: "PROLACTIN" Lab Results  Component Value Date   CHOL 224 (H) 02/22/2022   TRIG 22 02/22/2022   HDL 83 02/22/2022   CHOLHDL 2.7 02/22/2022   VLDL 4 02/22/2022   LDLCALC 137 (H) 02/22/2022   LDLCALC 153 (H) 03/12/2017    Physical Findings: AIMS:  , ,  ,  ,    CIWA:    COWS:     Musculoskeletal: Strength & Muscle Tone: within normal limits Gait & Station: normal Patient leans: N/A  Psychiatric Specialty Exam:  Presentation  General Appearance:  Disheveled  Eye Contact: Fair  Speech: Pressured; Clear and Coherent  Speech Volume: Normal  Handedness:No data recorded  Mood and Affect  Mood: Irritable; Labile  Affect: Congruent   Thought Process  Thought Processes: Goal Directed (some blocking)  Descriptions of Associations:Intact  Orientation:Full (Time, Place and Person)  Thought Content:Illogical; Perseveration  History of Schizophrenia/Schizoaffective  disorder:No data recorded Duration of Psychotic Symptoms:No data recorded Hallucinations:Hallucinations: None  Ideas of Reference:None  Suicidal Thoughts:Suicidal Thoughts: No  Homicidal Thoughts:Homicidal Thoughts:  No   Sensorium  Memory: Immediate Fair; Recent Fair  Judgment: Impaired  Insight: None   Executive Functions  Concentration: Poor  Attention Span: Poor  Recall: [employer] of Knowledge: Fair  Language: Fair   Psychomotor Activity  Psychomotor Activity: Psychomotor Activity: Normal   Assets  Assets: Desire for Improvement; Communication Skills; Housing; Intimacy; Resilience; Social Support; Vocational/Educational   Sleep  Sleep: Sleep: Fair Number of Hours of Sleep: 5    Physical Exam: Physical Exam Constitutional:      Appearance: Normal appearance.  Pulmonary:     Effort: Pulmonary effort is normal.  Neurological:     Mental Status: She is alert and oriented to person, place, and time.    Review of Systems  Psychiatric/Behavioral:  Negative for hallucinations and suicidal ideas. The patient does not have insomnia.    Blood pressure 119/85, pulse 91, temperature 98.6 F (37 C), temperature source Oral, resp. rate 18, height 5\' 6"  (1.676 m), weight 75.3 kg, SpO2 100%. Body mass index is 26.79 kg/m.   Treatment Plan Summary: Daily contact with patient to assess and evaluate symptoms and progress in treatment and Medication management  Pt continues to be resistant to tx and irritable. Today it is more clear that pt is having thought blocking. Pt can continue to benefit from SGA for mood stabilization and thought blocking and perseveration. Pt sleep is improving from collateral from husband.  Bipolar 1 d/o, current episode manic - Increase Risperdal  to 1mg  and 2mg  at bedtime - ramelteon 8mg  at bedtime   PRN -Tylenol  650mg  q6h, pain -Maalox 30ml q4h, indigestion -Atarax  25mg  TID, anxiety -Milk of Mag 30mL, constipation -Trazodone  50mg  QHS, insomnia  - flexeril  10mg  TID PO, muscle pain (home medication)   Agitation Protocol Zyprexa  5mg  TID PO PRN  OR Zyprexa  5mg  IM TID PRN AND/OR    Hypokalemia- resolved    Safety and  Monitoring: Involuntary admission to inpatient psychiatric unit for safety, stabilization and treatment Daily contact with patient to assess and evaluate symptoms and progress in treatment Patient's case to be discussed in multi-disciplinary team meeting Observation Level : q15 minute checks Vital signs: q12 hours Precautions: suicide, but pt currently verbally contracts for safety on unit    Discharge Planning: Social work and case management to assist with discharge planning and identification of hospital follow-up needs prior to discharge Estimated LOS: 5-7 days Discharge Concerns: Need to establish a safety plan; Medication compliance and effectiveness Discharge Goals: Return home with outpatient referrals for mental health follow-up including medication management/psychotherapy.   Tamera Falco, MD 07/12/2023, 9:28 AM

## 2023-07-12 NOTE — Progress Notes (Signed)
   07/12/23 1100  Psych Admission Type (Psych Patients Only)  Admission Status Involuntary  Psychosocial Assessment  Patient Complaints None  Eye Contact Avertive  Facial Expression Flat  Affect Blunted  Speech Logical/coherent  Interaction Assertive  Motor Activity Slow  Appearance/Hygiene Unremarkable  Behavior Characteristics Cooperative;Appropriate to situation  Mood Labile  Aggressive Behavior  Effect No apparent injury  Thought Process  Coherency Circumstantial  Content WDL  Delusions None reported or observed  Perception WDL  Hallucination None reported or observed  Judgment Poor  Confusion None  Danger to Self  Current suicidal ideation? Denies  Danger to Others  Danger to Others None reported or observed

## 2023-07-12 NOTE — Plan of Care (Signed)
  Problem: Activity: Goal: Interest or engagement in activities will improve Outcome: Not Progressing   Problem: Coping: Goal: Ability to verbalize frustrations and anger appropriately will improve Outcome: Not Progressing   

## 2023-07-12 NOTE — Plan of Care (Signed)
  Problem: Education: Goal: Emotional status will improve Outcome: Progressing   Problem: Activity: Goal: Interest or engagement in activities will improve Outcome: Progressing Goal: Sleeping patterns will improve Outcome: Progressing

## 2023-07-12 NOTE — B[facility] Group Notes (Signed)
 Adult Psychoeducational Group Note  Date:  07/12/2023 Time:  8:52 AM  Group Topic/Focus:  Goals Group:   The focus of this group is to help patients establish daily goals to achieve during treatment and discuss how the patient can incorporate goal setting into their daily lives to aide in recovery. Orientation:   The focus of this group is to educate the patient on the purpose and policies of crisis stabilization and provide a format to answer questions about their admission.  The group details unit policies and expectations of patients while admitted.  Participation Level:  Active  Participation Quality:  Attentive  Affect:  Appropriate  Cognitive:  Alert  Insight: Appropriate  Engagement in Group:  Engaged  Modes of Intervention:  Discussion  Additional Comments:  Patient attended and participated in the goals group.  Becki Bouton 07/12/2023, 8:52 AM

## 2023-07-13 ENCOUNTER — Encounter (HOSPITAL_COMMUNITY): Payer: Self-pay

## 2023-07-13 DIAGNOSIS — F319 Bipolar disorder, unspecified: Secondary | ICD-10-CM | POA: Diagnosis not present

## 2023-07-13 LAB — LIPID PANEL
Cholesterol: 206 mg/dL — ABNORMAL HIGH (ref 0–200)
HDL: 73 mg/dL (ref >40)
LDL Cholesterol: 114 mg/dL — ABNORMAL HIGH (ref 0–99)
Total CHOL/HDL Ratio: 2.8 ratio
Triglycerides: 94 mg/dL (ref <150)
VLDL: 19 mg/dL (ref 0–40)

## 2023-07-13 LAB — HEMOGLOBIN A1C
Hgb A1c MFr Bld: 7.3 % — ABNORMAL HIGH (ref 4.8–5.6)
Mean Plasma Glucose: 162.81 mg/dL

## 2023-07-13 LAB — VITAMIN B12: Vitamin B-12: 2773 pg/mL — ABNORMAL HIGH (ref 180–914)

## 2023-07-13 LAB — FOLATE: Folate: 8.1 ng/mL (ref >5.9)

## 2023-07-13 LAB — VITAMIN D 25 HYDROXY (VIT D DEFICIENCY, FRACTURES): Vit D, 25-Hydroxy: 49.28 ng/mL (ref 30–100)

## 2023-07-13 LAB — TSH: TSH: 1.59 u[IU]/mL (ref 0.350–4.500)

## 2023-07-13 MED ORDER — PALIPERIDONE PALMITATE ER 156 MG/ML IM SUSY
156.0000 mg | PREFILLED_SYRINGE | Freq: Once | INTRAMUSCULAR | Status: DC
  Filled 2023-07-13: qty 1

## 2023-07-13 MED ORDER — PALIPERIDONE PALMITATE ER 234 MG/1.5ML IM SUSY
234.0000 mg | PREFILLED_SYRINGE | Freq: Once | INTRAMUSCULAR | Status: AC
Start: 2023-07-13 — End: 2023-07-13
  Administered 2023-07-13: 234 mg via INTRAMUSCULAR

## 2023-07-13 NOTE — Progress Notes (Signed)
 [facility] MD Progress Note  07/13/2023 2:55 PM Laura Thompson  MRN:  [id number] Subjective:  Laura Thompson is a 48 yo patient w/ a PPH of Bipolar d/o who presented to [facility] via her husband w/ concern for mania and IVC'd by husband. During ED time pt received IM Geodon  20mg  and Depakote  500mg . Pt transferred to [facility].   RN update- Pt continues to be irritable and preoccupied with discharge. Pt is compliant with medications.  She reportedly attended a group overnight.  The patient was seen in her room during rounds.  She was irritable and alternatively tearful.  She has noted to be hyperverbal with loud pushed speech.  She has no insight into the fact that she is floridly manic.  She is really only interested in discussing discharge.  She had previously refused Invega Sustenna, but agreed to take the injection this morning.  During treatment team today, she refused to sign the participation form without creating a long addendum stating that she wished to be assessed for discharge today.  As the patient still presents as manic and extremely irritable, I believe she would be immediately readmitted where she to be discharged at this time.  The police became involved prior to her admission when she threatened someone on the street.  Prior to discharge, she will need a second dose of Invega Sustenna at 156 mg which could be given as soon as Friday.  Daughter- Miyanna Wiersma [phone]  Daughter reports that she feels her mother is doing worse than previous episodes. Daughter reports that pt was in the hospital in 05/2023. Daughter reports that her mother's behavior has been more extreme and agitated to others. Daughter reports that patient paranoia and AH that dad reported are new to her mania presentation. Daughter reports in relation to pt being upset about missing [employer], she has talked to pt about accepting hospitalization and understands the importance of the event. Daughter reports she is having  difficulty getting pt to accept hospitalization.   Principal Problem: Bipolar 1 disorder (HCC) Diagnosis: Principal Problem:   Bipolar 1 disorder (HCC)  Total Time spent with patient: 30 minutes  Past Psychiatric History:  Previous Psych Diagnoses: Bipolar 1 disorder Prior inpatient treatment:last in 02/2022  for mania under IVC, at least total of five admission per chart review. Pr endorses 6 in her life Current Psychiatrist:  Last documented video visit with Todd Fossa, MD on 05/11/2021  History of suicide: Reported spinning a revolver and putting it to her head at the age of 25 or 1 History of homicide: Denies Psychiatric medication history: Abilify , lithium  450 mg, Risperdal  2 mg, Depakote , Zyprexa , trazodone , lamictal , seroquel , haldol , zyprexa  Pt reports that she did not think Abilify , Lithium , trazodone , not seroqel were helpful and would not take these medications. Pt endorsed feeling risperdal  as helpful and was willing to take this. Pt not really able to give true adverse reactions to these medications.   Psychiatric medication compliance history: Reported noncompliance    Past Medical History:  Past Medical History:  Diagnosis Date   Bipolar 1 disorder (HCC)    Diabetes mellitus without complication (HCC)     Past Surgical History:  Procedure Laterality Date   CERVICAL ABLATION     DENTAL SURGERY     lipoma removal     Family History:  Family History  Problem Relation Age of Onset   Thyroid disease Mother    Hypertension Father    Diabetes Father    Glaucoma Father  Family Psychiatric  History: unknown  Social History:  Social History   Substance and Sexual Activity  Alcohol Use No     Social History   Substance and Sexual Activity  Drug Use No    Social History   Socioeconomic History   Marital status: Married    Spouse name: Not on file   Number of children: Not on file   Years of education: Not on file   Highest education level: Not on file   Occupational History   Not on file  Tobacco Use   Smoking status: Never   Smokeless tobacco: Never  Vaping Use   Vaping status: Never Used  Substance and Sexual Activity   Alcohol use: No   Drug use: No   Sexual activity: Yes  Other Topics Concern   Not on file  Social History Narrative   Not on file   Social Drivers of Health   Financial Resource Strain: Low Risk  (05/30/2022)   Received from [facility]   Overall Financial Resource Strain (CARDIA)    Difficulty of Paying Living Expenses: Not very hard  Food Insecurity: No Food Insecurity (07/10/2023)   Hunger Vital Sign    Worried About Running Out of Food in the Last Year: Never true    Ran Out of Food in the Last Year: Never true  Transportation Needs: No Transportation Needs (07/10/2023)   PRAPARE - Administrator, Civil Service (Medical): No    Lack of Transportation (Non-Medical): No  Physical Activity: Insufficiently Active (05/30/2022)   Received from [facility]   Exercise Vital Sign    Days of Exercise per Week: 2 days    Minutes of Exercise per Session: 50 min  Stress: No Stress Concern Present (05/30/2022)   Received from [facility] - Occupational Stress Questionnaire    Feeling of Stress : Not at all  Social Connections: Socially Integrated (05/30/2022)   Received from [facility]   Social Network    How would you rate your social network (family, work, friends)?: Good participation with social networks   Additional Social History:                         Sleep: Fair  Appetite:  Good  Current Medications: Current Facility-Administered Medications  Medication Dose Route Frequency Provider Last Rate Last Admin   acetaminophen  (TYLENOL ) tablet 650 mg  650 mg Oral Q6H PRN Motley-Mangrum, Jadeka A, PMHNP   650 mg at 07/10/23 2113   alum & mag hydroxide-simeth (MAALOX/MYLANTA) 200-200-20 MG/5ML suspension 30 mL  30 mL Oral Q4H PRN  Motley-Mangrum, Jadeka A, PMHNP       cyclobenzaprine  (FLEXERIL ) tablet 10 mg  10 mg Oral TID PRN McQuilla, Jai B, MD   10 mg at 07/12/23 0944   hydrOXYzine  (ATARAX ) tablet 25 mg  25 mg Oral TID PRN Motley-Mangrum, Jadeka A, PMHNP       magnesium  hydroxide (MILK OF MAGNESIA) suspension 30 mL  30 mL Oral Daily PRN Motley-Mangrum, Jadeka A, PMHNP       OLANZapine  (ZYPREXA ) injection 10 mg  10 mg Intramuscular TID PRN Motley-Mangrum, Jadeka A, PMHNP       OLANZapine  (ZYPREXA ) injection 5 mg  5 mg Intramuscular TID PRN Motley-Mangrum, Jadeka A, PMHNP       OLANZapine  zydis (ZYPREXA ) disintegrating tablet 5 mg  5 mg Oral TID PRN Motley-Mangrum, Jadeka A, PMHNP       [  START ON 07/17/2023] paliperidone (INVEGA SUSTENNA) injection 156 mg  156 mg Intramuscular Once Louanne Calvillo S, MD       ramelteon (ROZEREM) tablet 8 mg  8 mg Oral QHS McQuilla, Jai B, MD   8 mg at 07/12/23 2039   risperiDONE  (RISPERDAL  M-TABS) disintegrating tablet 1 mg  1 mg Oral Daily McQuilla, Jai B, MD   1 mg at 07/13/23 1610   risperiDONE  (RISPERDAL  M-TABS) disintegrating tablet 2 mg  2 mg Oral QHS McQuilla, Jai B, MD   2 mg at 07/12/23 2039    Lab Results:  Results for orders placed or performed during the hospital encounter of 07/10/23 (from the past 48 hours)  Comprehensive metabolic panel     Status: Abnormal   Collection Time: 07/12/23  6:28 AM  Result Value Ref Range   Sodium 134 (L) 135 - 145 mmol/L   Potassium 4.3 3.5 - 5.1 mmol/L   Chloride 103 98 - 111 mmol/L   CO2 25 22 - 32 mmol/L   Glucose, Bld 195 (H) 70 - 99 mg/dL    Comment: Glucose reference range applies only to samples taken after fasting for at least 8 hours.   BUN 13 6 - 20 mg/dL   Creatinine, Ser 9.60 0.44 - 1.00 mg/dL   Calcium  8.4 (L) 8.9 - 10.3 mg/dL   Total Protein 6.7 6.5 - 8.1 g/dL   Albumin 3.4 (L) 3.5 - 5.0 g/dL   AST 16 15 - 41 U/L   ALT 14 0 - 44 U/L   Alkaline Phosphatase 42 38 - 126 U/L   Total Bilirubin 0.5 0.0 - 1.2 mg/dL   GFR,  Estimated >45 >40 mL/min    Comment: (NOTE) Calculated using the CKD-EPI Creatinine Equation (2021)    Anion gap 6 5 - 15    Comment: Performed at [facility], [address] [place], [place] [address]    Blood Alcohol level:  Lab Results  Component Value Date   [facility] <15 07/09/2023   ETH <10 05/31/2023    Metabolic Disorder Labs: Lab Results  Component Value Date   HGBA1C 7.0 (A) 12/02/2022   MPG 183 02/23/2022   MPG 186 02/22/2022   No results found for: "PROLACTIN" Lab Results  Component Value Date   CHOL 224 (H) 02/22/2022   TRIG 22 02/22/2022   HDL 83 02/22/2022   CHOLHDL 2.7 02/22/2022   VLDL 4 02/22/2022   LDLCALC 137 (H) 02/22/2022   LDLCALC 153 (H) 03/12/2017    Physical Findings: AIMS:  , ,  ,  ,    CIWA:    COWS:     Musculoskeletal: Strength & Muscle Tone: within normal limits Gait & Station: normal Patient leans: N/A  Psychiatric Specialty Exam:  Presentation  General Appearance:  Appropriate for Environment  Eye Contact: Fair  Speech: Pressured  Speech Volume: Increased  Handedness:Right   Mood and Affect  Mood: Anxious; Irritable  Affect: Congruent; Tearful   Thought Process  Thought Processes: Disorganized  Descriptions of Associations:Tangential  Orientation:Partial  Thought Content:Illogical  History of Schizophrenia/Schizoaffective disorder:No data recorded Duration of Psychotic Symptoms:No data recorded Hallucinations:Hallucinations: None  Ideas of Reference:None  Suicidal Thoughts:Suicidal Thoughts: No  Homicidal Thoughts:Homicidal Thoughts: No   Sensorium  Memory: Recent Good  Judgment: Poor  Insight: Poor   Executive Functions  Concentration: Fair  Attention Span: Fair  Recall: Good  Fund of Knowledge: Good  Language: Good   Psychomotor Activity  Psychomotor Activity: Psychomotor Activity: Restlessness  Assets  Assets: Manufacturing systems engineer; Social  Support; Housing   Sleep  Sleep: Sleep: Fair Number of Hours of Sleep: 5    Physical Exam: Physical Exam Constitutional:      Appearance: Normal appearance.  Pulmonary:     Effort: Pulmonary effort is normal.  Neurological:     Mental Status: She is alert and oriented to person, place, and time.    Review of Systems  Psychiatric/Behavioral:  Negative for hallucinations and suicidal ideas. The patient does not have insomnia.    Blood pressure 111/80, pulse 89, temperature 97.9 F (36.6 C), temperature source Oral, resp. rate 18, height 5\' 6"  (1.676 m), weight 75.3 kg, SpO2 100%. Body mass index is 26.79 kg/m.   Treatment Plan Summary: Daily contact with patient to assess and evaluate symptoms and progress in treatment and Medication management  Pt continues to be resistant to tx and irritable. Today it is more clear that pt is having thought blocking. Pt can continue to benefit from SGA for mood stabilization and thought blocking and perseveration. Pt sleep is improving from collateral from husband.  Bipolar 1 d/o, current episode manic - Continue Risperdal  to 1mg  and 2mg  at bedtime -Invega Sustenna 234 mg administered on 5/26.  Will plan on administering Invega Sustenna 156 mg on 5/30. - ramelteon 8mg  at bedtime   PRN -Tylenol  650mg  q6h, pain -Maalox 30ml q4h, indigestion -Atarax  25mg  TID, anxiety -Milk of Mag 30mL, constipation -Trazodone  50mg  QHS, insomnia  - flexeril  10mg  TID PO, muscle pain (home medication)   Agitation Protocol Zyprexa  5mg  TID PO PRN  OR Zyprexa  5mg  IM TID PRN AND/OR    Hypokalemia- resolved    Safety and Monitoring: Involuntary admission to inpatient psychiatric unit for safety, stabilization and treatment Daily contact with patient to assess and evaluate symptoms and progress in treatment Patient's case to be discussed in multi-disciplinary team meeting Observation Level : q15 minute checks Vital signs: q12 hours Precautions: suicide,  but pt currently verbally contracts for safety on unit    Discharge Planning: Social work and case management to assist with discharge planning and identification of hospital follow-up needs prior to discharge Estimated LOS: 5-7 days Discharge Concerns: Need to establish a safety plan; Medication compliance and effectiveness Discharge Goals: Return home with outpatient referrals for mental health follow-up including medication management/psychotherapy.   Timmothy Foots, MD 07/13/2023, 2:55 PM

## 2023-07-13 NOTE — Progress Notes (Signed)
   07/13/23 1300  Psych Admission Type (Psych Patients Only)  Admission Status Involuntary  Psychosocial Assessment  Patient Complaints Irritability  Eye Contact Fair  Facial Expression Flat  Affect Blunted  Speech Logical/coherent  Interaction Assertive  Motor Activity Restless  Appearance/Hygiene Unremarkable  Behavior Characteristics Appropriate to situation  Mood Labile  Thought Process  Coherency Circumstantial  Content WDL  Delusions None reported or observed  Perception WDL  Hallucination None reported or observed  Judgment Poor  Confusion None  Danger to Self  Current suicidal ideation? Denies  Danger to Others  Danger to Others None reported or observed   Dar Note: Patient fixated and preoccupied with discharge.  Asking staff multiple times to call her doctor for discharge plan.  Invega long acting injection given with no adverse effect.  Patient safety maintained on and off the unit.

## 2023-07-13 NOTE — Progress Notes (Addendum)
   07/13/23 2045  Psych Admission Type (Psych Patients Only)  Admission Status Involuntary  Psychosocial Assessment  Patient Complaints None  Eye Contact Fair  Facial Expression Animated  Affect Appropriate to circumstance  Speech Logical/coherent  Interaction Assertive  Motor Activity Other (Comment) (wnl)  Appearance/Hygiene Unremarkable  Behavior Characteristics Cooperative;Appropriate to situation  Mood Pleasant  Thought Process  Coherency Circumstantial  Content WDL  Delusions None reported or observed  Perception WDL  Hallucination None reported or observed  Judgment Impaired  Confusion None  Danger to Self  Current suicidal ideation? Denies  Agreement Not to Harm Self Yes  Description of Agreement verbal  Danger to Others  Danger to Others None reported or observed   Progress note   D: Pt seen at med window. Pt denies SI, HI, AVH. Animated and pleasant. Pt rates pain  0/10. Pt rates anxiety  0/10 and depression  0/10. Pt states her husband came to visit and that it was positive. No other concerns noted at this time.  A: Pt provided support and encouragement. Pt given scheduled medication as prescribed. PRNs as appropriate. Q15 min checks for safety.   R: Pt safe on the unit. Will continue to monitor.

## 2023-07-13 NOTE — Plan of Care (Signed)
   Problem: Education: Goal: Emotional status will improve Outcome: Not Progressing Goal: Mental status will improve Outcome: Not Progressing

## 2023-07-13 NOTE — BH IP Treatment Plan (Signed)
 Interdisciplinary Treatment and Diagnostic Plan Update  07/13/2023 Time of Session: 1137 Nonie Lochner MRN: [id number]  Principal Diagnosis: Bipolar 1 disorder (HCC)  Secondary Diagnoses: Principal Problem:   Bipolar 1 disorder (HCC)   Current Medications:  Current Facility-Administered Medications  Medication Dose Route Frequency Provider Last Rate Last Admin   acetaminophen  (TYLENOL ) tablet 650 mg  650 mg Oral Q6H PRN Motley-Mangrum, Jadeka A, PMHNP   650 mg at 07/10/23 2113   alum & mag hydroxide-simeth (MAALOX/MYLANTA) 200-200-20 MG/5ML suspension 30 mL  30 mL Oral Q4H PRN Motley-Mangrum, Jadeka A, PMHNP       cyclobenzaprine  (FLEXERIL ) tablet 10 mg  10 mg Oral TID PRN McQuilla, Jai B, MD   10 mg at 07/12/23 0944   hydrOXYzine  (ATARAX ) tablet 25 mg  25 mg Oral TID PRN Motley-Mangrum, Jadeka A, PMHNP       magnesium  hydroxide (MILK OF MAGNESIA) suspension 30 mL  30 mL Oral Daily PRN Motley-Mangrum, Jadeka A, PMHNP       OLANZapine  (ZYPREXA ) injection 10 mg  10 mg Intramuscular TID PRN Motley-Mangrum, Jadeka A, PMHNP       OLANZapine  (ZYPREXA ) injection 5 mg  5 mg Intramuscular TID PRN Motley-Mangrum, Jadeka A, PMHNP       OLANZapine  zydis (ZYPREXA ) disintegrating tablet 5 mg  5 mg Oral TID PRN Motley-Mangrum, Jadeka A, PMHNP       [START ON 07/17/2023] paliperidone (INVEGA SUSTENNA) injection 156 mg  156 mg Intramuscular Once Parker, Alvin S, MD       ramelteon (ROZEREM) tablet 8 mg  8 mg Oral QHS McQuilla, Jai B, MD   8 mg at 07/12/23 2039   risperiDONE  (RISPERDAL  M-TABS) disintegrating tablet 1 mg  1 mg Oral Daily McQuilla, Jai B, MD   1 mg at 07/13/23 0849   risperiDONE  (RISPERDAL  M-TABS) disintegrating tablet 2 mg  2 mg Oral QHS McQuilla, Jai B, MD   2 mg at 07/12/23 2039   PTA Medications: Medications Prior to Admission  Medication Sig Dispense Refill Last Dose/Taking   ascorbic acid (VITAMIN C ) 500 MG tablet Take 500 mg by mouth daily.   Past Week   Cholecalciferol  62.5 MCG  (2500 UT) CAPS Take 5,000 Units by mouth daily.   Past Week   TURMERIC CURCUMIN PO Take by mouth.   Taking   zinc  gluconate 50 MG tablet Take 50 mg by mouth daily.   Past Week   TRULICITY  0.75 MG/0.5ML SOAJ ADMINISTER 0.75 MG UNDER THE SKIN 1 TIME A WEEK (Patient taking differently: Inject 0.75 mg into the skin every 14 (fourteen) days. Pt stated does not take weekly, usually every 2 wk) 2 mL 0     Patient Stressors: Medication change or noncompliance   Occupational concerns    Patient Strengths: Capable of independent living  Education administrator  Supportive family/friends  Work skills   Treatment Modalities: Medication Management, Group therapy, Case management,  1 to 1 session with clinician, Psychoeducation, Recreational therapy.   Physician Treatment Plan for Primary Diagnosis: Bipolar 1 disorder (HCC) Long Term Goal(s): Improvement in symptoms so as ready for discharge   Short Term Goals: Ability to identify changes in lifestyle to reduce recurrence of condition will improve Ability to demonstrate self-control will improve Ability to maintain clinical measurements within normal limits will improve Compliance with prescribed medications will improve Ability to verbalize feelings will improve Ability to disclose and discuss suicidal ideas Ability to identify and develop effective coping behaviors will improve Ability to identify  triggers associated with substance abuse/mental health issues will improve  Medication Management: Evaluate patient's response, side effects, and tolerance of medication regimen.  Therapeutic Interventions: 1 to 1 sessions, Unit Group sessions and Medication administration.  Evaluation of Outcomes: Not Progressing  Physician Treatment Plan for Secondary Diagnosis: Principal Problem:   Bipolar 1 disorder (HCC)  Long Term Goal(s): Improvement in symptoms so as ready for discharge   Short Term Goals: Ability to identify changes in  lifestyle to reduce recurrence of condition will improve Ability to demonstrate self-control will improve Ability to maintain clinical measurements within normal limits will improve Compliance with prescribed medications will improve Ability to verbalize feelings will improve Ability to disclose and discuss suicidal ideas Ability to identify and develop effective coping behaviors will improve Ability to identify triggers associated with substance abuse/mental health issues will improve     Medication Management: Evaluate patient's response, side effects, and tolerance of medication regimen.  Therapeutic Interventions: 1 to 1 sessions, Unit Group sessions and Medication administration.  Evaluation of Outcomes: Not Progressing   RN Treatment Plan for Primary Diagnosis: Bipolar 1 disorder (HCC) Long Term Goal(s): Knowledge of disease and therapeutic regimen to maintain health will improve  Short Term Goals: Ability to remain free from injury will improve, Ability to verbalize frustration and anger appropriately will improve, Ability to demonstrate self-control, Ability to participate in decision making will improve, Ability to verbalize feelings will improve, Ability to disclose and discuss suicidal ideas, Ability to identify and develop effective coping behaviors will improve, and Compliance with prescribed medications will improve  Medication Management: RN will administer medications as ordered by provider, will assess and evaluate patient's response and provide education to patient for prescribed medication. RN will report any adverse and/or side effects to prescribing provider.  Therapeutic Interventions: 1 on 1 counseling sessions, Psychoeducation, Medication administration, Evaluate responses to treatment, Monitor vital signs and CBGs as ordered, Perform/monitor CIWA, COWS, AIMS and Fall Risk screenings as ordered, Perform wound care treatments as ordered.  Evaluation of Outcomes: Not  Progressing   LCSW Treatment Plan for Primary Diagnosis: Bipolar 1 disorder (HCC) Long Term Goal(s): Safe transition to appropriate next level of care at discharge, Engage patient in therapeutic group addressing interpersonal concerns.  Short Term Goals: Engage patient in aftercare planning with referrals and resources, Increase social support, Increase ability to appropriately verbalize feelings, Increase emotional regulation, Facilitate acceptance of mental health diagnosis and concerns, Identify triggers associated with mental health/substance abuse issues, and Increase skills for wellness and recovery  Therapeutic Interventions: Assess for all discharge needs, 1 to 1 time with Social worker, Explore available resources and support systems, Assess for adequacy in community support network, Educate family and significant other(s) on suicide prevention, Complete Psychosocial Assessment, Interpersonal group therapy.  Evaluation of Outcomes: Not Progressing   Progress in Treatment: Attending groups: Yes. Participating in groups: Yes. Taking medication as prescribed: Yes. Toleration medication: No. Family/Significant other contact made: No, will contact:  husband, Zissel Biederman [phone] Patient understands diagnosis: Yes. Discussing patient identified problems/goals with staff: Yes. Medical problems stabilized or resolved: Yes. Denies suicidal/homicidal ideation: Yes. Issues/concerns per patient self-inventory: No. Other: n/a  New problem(s) identified: No, Describe:  None  New Short Term/Long Term Goal(s): medication stabilization, elimination of SI thoughts, development of comprehensive mental wellness plan.   Patient Goals:  "Release me and go home"  Discharge Plan or Barriers: Patient recently admitted. CSW will continue to follow and assess for appropriate referrals and possible discharge planning.   Reason for  Continuation of Hospitalization:  Aggression Anxiety Mania Medication stabilization Other; describe mood stabilization, discharge planning  Estimated Length of Stay: 3-5 DAYS  Last 3 [place] Suicide Severity Risk Score: Flowsheet Row [facility] (Current[facility] from [facility]/09[facility]2023 in [facility] ED from 05/31/2023 in [facility] Emergency Department at [facility]  C-SSRS RISK CATEGORY No Risk No Risk No Risk       Last PHQ 2/9 Scores:    12/02/2022    2:35 PM 05/11/2021   10:24 AM 06/01/2018    3:46 PM  Depression screen PHQ 2/9  Decreased Interest 1 1 0  Down, Depressed, Hopeless  1 0  PHQ - 2 Score 1 2 0  Altered sleeping 3 3   Tired, decreased energy 3 3   Change in appetite 3 0   Feeling bad or failure about yourself  0 0   Trouble concentrating 0 0   Moving slowly or fidgety/restless 0 0   Suicidal thoughts 0 0   PHQ-9 Score 10 8   Difficult doing work/chores Not difficult at all Not difficult at all     Scribe for Treatment Team: Roseana Rhine N Abbigal Radich, LCSW 07/13/2023 5:31 PM

## 2023-07-13 NOTE — Group Note (Signed)
 Date:  07/13/2023 Time:  8:19 PM  Group Topic/Focus:  Wrap-Up Group:   The focus of this group is to help patients review their daily goal of treatment and discuss progress on daily workbooks.    Participation Level:  Active  Participation Quality:  Sharing  Affect:  Appropriate  Cognitive:  Appropriate  Insight: Good  Engagement in Group:  Engaged  Modes of Intervention:  Discussion  Additional Comments:    Joann Mu 07/13/2023, 8:19 PM

## 2023-07-13 NOTE — Group Note (Signed)
 LCSW Group Therapy Note   Group Date: 07/13/2023 Start Time: 1300 End Time: 1400   Participation:  patient was present.  Her eyes were closed.  She listened to the discussion, however didn't participate.  She was alert whenever printouts were distributed.    Type of Therapy:  Group Therapy  Topic:  "Money Matters: Creating Stability, Confidence and Peace of Mind."  Objective: To help participants understand the impact of financial stability on well-being through the lens of Maslow's Hierarchy of Needs and develop practical strategies for budgeting, saving, and debt repayment.  Goals: Increase awareness of spending habits and financial priorities, recognizing how money supports basic needs, security, and relationships. Develop simple budgeting and saving strategies to enhance stability and peace of mind.  Reduce financial stress by creating a realistic debt repayment plan, supporting long-term confidence and well-being.  Summary:  Participants explored how financial stability connects to basic needs, relationships, and self-esteem using Maslow's Hierarchy. They discussed budgeting, saving, and debt repayment strategies, identifying small, manageable changes. Through interactive discussion and self-reflection, they gained insight into their financial habits and created personal action steps for improvement.  Therapeutic Modalities Used: Elements of Cognitive Behavioral Therapy (CBT) - Addressing financial stress and thought patterns. Psychoeducation - Engineer, agricultural. Elements of Motivational Interviewing (MI) - Encouraging realistic, achievable changes. Group Support - Reducing shame and stress through shared experiences.   Tonae Livolsi O Brodie Correll, LCSWA 07/13/2023  5:08 PM

## 2023-07-13 NOTE — Group Note (Signed)
 Recreation Therapy Group Note   Group Topic:Leisure Education  Group Date: 07/13/2023 Start Time: 1031 End Time: 1109 Facilitators: Tryphena Perkovich-McCall, LRT,CTRS Location: [address] Dayroom   Group Topic: Leisure Education   Goal Area(s) Addresses:  Patient will successfully identify positive leisure and recreation activities.  Patient will acknowledge benefits of participation in healthy leisure activities post discharge.  Patient will actively work with peers toward a shared goal.   Behavioral Response: Engaged   Intervention: Competitive Group Game   Activity: Guess the [employer]. The game was broken up into 6 categories (Pop, [place], [place], Hip-Hop, R&B and Dance). The first team would use the spinner to land one of the music categories. Which ever category the spinner landed on, that group would read the lyrics and opposing team had to fill in the blank. If they answered correctly, they kept the card. The team with the most cards at the end, wins the game. Post-activity discussion reviewed benefits of positive recreation outlets: reducing stress, improving coping mechanisms, increasing self-esteem, and building larger support systems.   Education:  Teacher, English as a foreign language, Stress Management, Discharge Planning  Education Outcome: Acknowledges education/In group clarification offered/Needs additional education   Affect/Mood: Appropriate   Participation Level: Engaged   Participation Quality: Independent   Behavior: Appropriate and Hesitant   Speech/Thought Process: Focused   Insight: Good   Judgement: Good   Modes of Intervention: Competitive Play   Patient Response to Interventions:  Engaged   Education Outcome:  In group clarification offered    Clinical Observations/Individualized Feedback: Pt seemed hesitant to engage at first but loosened and became more interactive as group went on. Pt was bright and interactive with peers. Pt was the winner of the game.     Plan: Continue to engage patient in RT group sessions 2-3x/week.   Fay Swider-McCall, LRT,CTRS 07/13/2023 2:39 PM

## 2023-07-14 LAB — RPR: RPR Ser Ql: NONREACTIVE

## 2023-07-14 NOTE — Progress Notes (Signed)
   07/14/23 0600  15 Minute Checks  Location Bedroom  Visual Appearance Calm  Behavior Sleeping  Sleep (Behavioral Health Patients Only)  Calculate sleep? (Click Yes once per 24 hr at 0600 safety check) Yes  Documented sleep last 24 hours 9.25

## 2023-07-14 NOTE — Progress Notes (Signed)
   07/14/23 0807  Psych Admission Type (Psych Patients Only)  Admission Status Involuntary  Psychosocial Assessment  Patient Complaints None  Eye Contact Fair  Facial Expression Flat  Affect Appropriate to circumstance  Speech Logical/coherent  Interaction Assertive  Motor Activity Other (Comment) (WDL)  Appearance/Hygiene Unremarkable  Behavior Characteristics Cooperative  Mood Pleasant  Thought Process  Coherency Circumstantial  Content WDL  Delusions None reported or observed  Perception WDL  Hallucination None reported or observed  Judgment Poor  Confusion None  Danger to Self  Current suicidal ideation? Denies  Agreement Not to Harm Self Yes  Description of Agreement verbal  Danger to Others  Danger to Others None reported or observed

## 2023-07-14 NOTE — Group Note (Signed)
 Date:  07/14/2023 Time:  9:57 AM  Group Topic/Focus:  Goals Group:   The focus of this group is to help patients establish daily goals to achieve during treatment and discuss how the patient can incorporate goal setting into their daily lives to aide in recovery. Orientation:   The focus of this group is to educate the patient on the purpose and policies of crisis stabilization and provide a format to answer questions about their admission.  The group details unit policies and expectations of patients while admitted.    Participation Level:  Active  Participation Quality:  Appropriate  Affect:  Appropriate  Cognitive:  Appropriate  Insight: Good  Engagement in Group:  Engaged  Modes of Intervention:  Discussion  Additional Comments:  Pt goal is to go back to school to receive her masters degree.   Almarie Arias 07/14/2023, 9:57 AM

## 2023-07-14 NOTE — Group Note (Signed)
 Recreation Therapy Group Note   Group Topic:Animal Assisted Therapy   Group Date: 07/14/2023 Start Time: 0454 End Time: 1030 Facilitators: Nolita Kutter-McCall, LRT,CTRS Location: [address] Dayroom   Animal-Assisted Activity (AAA) Program Checklist/Progress Notes Patient Eligibility Criteria Checklist & Daily Group note for Rec Tx Intervention  AAA/T Program Assumption of Risk Form signed by Patient/ or Parent Legal Guardian Yes  Patient is free of allergies or severe asthma Yes  Patient reports no fear of animals Yes  Patient reports no history of cruelty to animals Yes  Patient understands his/her participation is voluntary Yes  Patient washes hands before animal contact Yes  Patient washes hands after animal contact Yes  Behavioral Response: Engaged   Education: Charity fundraiser, Appropriate Animal Interaction   Education Outcome: Acknowledges education.   Clinical Observations/Feedback: Patient attended session and interacted appropriately with therapy dog and peers. Patient asked appropriate questions about therapy dog and his training. Patient shared stories about their pets at home with group.   Sofia Dunn, LRT/CTRS    Affect/Mood: Appropriate   Participation Level: Engaged   Participation Quality: Independent   Behavior: Appropriate   Speech/Thought Process: Focused   Insight: Good   Judgement: Good   Modes of Intervention: Teaching laboratory technician   Patient Response to Interventions:  Engaged   Education Outcome:  In group clarification offered    Clinical Observations/Individualized Feedback: Pt was bright and engaged during group. Pt was also interactive with peers and therapy dog team. Pt asked questions and was attentive. When a peer and volunteer were talking about the care it takes for a dog, pt stated "all I hear is money cha-ching". Pt was appropriate and on task during group session.     Plan: Continue to engage patient in RT group  sessions 2-3x/week.   Korena Nass-McCall, LRT,CTRS 07/14/2023 12:27 PM

## 2023-07-14 NOTE — Progress Notes (Cosign Needed Addendum)
 [facility] MD Progress Note  07/14/2023 1:49 PM Laura Thompson  MRN:  [id number] Subjective:  Laura Thompson is a 48 yo patient w/ a PPH of Bipolar d/o who presented to [facility] via her husband w/ concern for mania and IVC'd by husband. During ED time pt received IM Geodon  20mg  and Depakote  500mg . Pt transferred to [facility].   on.   Principal Problem: Bipolar 1 disorder (HCC) Diagnosis: Principal Problem:   Bipolar 1 disorder (HCC)  Total Time spent with patient: 30 minutes  On interview, patient exhibited pushed speech but was interruptible, linear, goal directed and able to participate in conversation fully.  Patient affirms that she has been sleeping "too good" and that the medication has been making her sleepy, however no other medication side effects.  Noted some diarrhea earlier in her stay, which is now resolved.  Willing to receive follow-up Invega injectable this coming Friday 5/30.  Very much wants to discharge as she does not want to miss field day.  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  Notes that husband has a gun, but she does not have access to this [facility].  Patient gave verbal permission to speak with husband Jodean Valade [phone]): On interview with husband, who filled out initial IVC, he thinks she's made significant progress.  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  Patient has historically not taken PO medications but is willing to get injectables.  Feels that the injectable form of the medication is a breakthrough development, and is somewhat surprised/hurt that no one had told him about this option before.  No safety concerns. Locked up firearms, doesn't know where key is at.  Has been getting better over stay but it is almost at  baseline now.  She is now totally in touch with reality and apologetic.  Patient has "nothing but good thoughts" about discharge, and is willing to pick up patient tomorrow at 11 AM.  Past Psychiatric History:  Previous Psych Diagnoses: Bipolar 1 disorder Prior inpatient treatment:last in 02/2022  for mania under IVC, at least total of five admission per chart review. Pr endorses 6 in her life Current Psychiatrist:  Last documented video visit with Todd Fossa, MD on 05/11/2021  History of suicide: Reported spinning a revolver and putting it to her head at the age of [age] History of homicide: Denies Psychiatric medication history: Abilify , lithium  450 mg, Risperdal  2 mg, Depakote , Zyprexa , trazodone , lamictal , seroquel , haldol , zyprexa  Pt reports that she did not think Abilify , Lithium , trazodone , not seroqel were helpful and would not take these medications. Pt endorsed feeling risperdal  as helpful and was willing to take this. Pt not really able to give true adverse reactions to these medications.   Psychiatric medication compliance history: Reported noncompliance    Past Medical History:  Past Medical History:  Diagnosis Date   Bipolar 1 disorder (HCC)    Diabetes mellitus without complication (HCC)     Past Surgical History:  Procedure Laterality Date   CERVICAL ABLATION     DENTAL SURGERY     lipoma removal     Family History:  Family History  Problem Relation Age of Onset   Thyroid disease Mother    Hypertension Father  Diabetes Father    Glaucoma Father    Family Psychiatric  History: unknown  Social History:  Social History   Substance and Sexual Activity  Alcohol Use No     Social History   Substance and Sexual Activity  Drug Use No    Social History   Socioeconomic History   Marital status: Married    Spouse name: Not on file   Number of children: Not on file   Years of education: Not on file   Highest education level: Not on file  Occupational  History   Not on file  Tobacco Use   Smoking status: Never   Smokeless tobacco: Never  Vaping Use   Vaping status: Never Used  Substance and Sexual Activity   Alcohol use: No   Drug use: No   Sexual activity: Yes  Other Topics Concern   Not on file  Social History Narrative   Not on file   Social Drivers of Health   Financial Resource Strain: Low Risk  (05/30/2022)   [facility] from [facility]   Overall Financial Resource Strain (CARDIA)    Difficulty of Paying Living Expenses: Not very hard  Food Insecurity: No Food Insecurity (07/10/2023)   Hunger Vital Sign    Worried About Running Out of Food in the Last Year: Never true    Ran Out of Food in the Last Year: Never true  Transportation Needs: No Transportation Needs (07/10/2023)   PRAPARE - Administrator, Civil Service (Medical): No    Lack of Transportation (Non-Medical): No  Physical Activity: Insufficiently Active (05/30/2022)   Received from [facility]   Exercise Vital Sign    Days of Exercise per Week: 2 days    Minutes of Exercise per Session: 50 min  Stress: No Stress Concern Present (05/30/2022)   Received from [facility] - Occupational Stress Questionnaire    Feeling of Stress : Not at all  Social Connections: Socially Integrated (05/30/2022)   Received from [facility]   Social Network    How would you rate your social network (family, work, friends)?: Good participation with social networks   Additional Social History:                         Sleep: Fair  Appetite:  Good  Current Medications: Current Facility-Administered Medications  Medication Dose Route Frequency Provider Last Rate Last Admin   acetaminophen  (TYLENOL ) tablet 650 mg  650 mg Oral Q6H PRN Motley-Mangrum, Jadeka A, PMHNP   650 mg at 07/10/23 2113   alum & mag hydroxide-simeth (MAALOX/MYLANTA) 200-200-20 MG/5ML suspension 30 mL  30 mL Oral Q4H PRN Motley-Mangrum,  Jadeka A, PMHNP       cyclobenzaprine  (FLEXERIL ) tablet 10 mg  10 mg Oral TID PRN McQuilla, Jai B, MD   10 mg at 07/12/23 0944   hydrOXYzine  (ATARAX ) tablet 25 mg  25 mg Oral TID PRN Motley-Mangrum, Jadeka A, PMHNP       magnesium  hydroxide (MILK OF MAGNESIA) suspension 30 mL  30 mL Oral Daily PRN Motley-Mangrum, Jadeka A, PMHNP       OLANZapine  (ZYPREXA ) injection 10 mg  10 mg Intramuscular TID PRN Motley-Mangrum, Jadeka A, PMHNP       OLANZapine  (ZYPREXA ) injection 5 mg  5 mg Intramuscular TID PRN Motley-Mangrum, Jadeka A, PMHNP       OLANZapine  zydis (ZYPREXA ) disintegrating tablet 5 mg  5 mg Oral  TID PRN Motley-Mangrum, Jadeka A, PMHNP       [START ON 07/17/2023] paliperidone (INVEGA SUSTENNA) injection 156 mg  156 mg Intramuscular Once Parker, Alvin S, MD       ramelteon (ROZEREM) tablet 8 mg  8 mg Oral QHS McQuilla, Jai B, MD   8 mg at 07/13/23 2046   risperiDONE  (RISPERDAL  M-TABS) disintegrating tablet 1 mg  1 mg Oral Daily McQuilla, Jai B, MD   1 mg at 07/14/23 1191   risperiDONE  (RISPERDAL  M-TABS) disintegrating tablet 2 mg  2 mg Oral QHS McQuilla, Jai B, MD   2 mg at 07/13/23 2046    Lab Results:  Results for orders placed or performed during the hospital encounter of 07/10/23 (from the past 48 hours)  Folate     Status: None   Collection Time: 07/13/23  6:45 PM  Result Value Ref Range   Folate 8.1 >5.9 ng/mL    Comment: Performed at [facility], [address] [place], [place] [address]  Hemoglobin A1c     Status: Abnormal   Collection Time: 07/13/23  6:45 PM  Result Value Ref Range   Hgb A1c MFr Bld 7.3 (H) 4.8 - 5.6 %    Comment: (NOTE) Pre diabetes:          5.7%-6.4%  Diabetes:              >6.4%  Glycemic control for   <7.0% adults with diabetes    Mean Plasma Glucose 162.81 mg/dL    Comment: Performed at [facility], [address] [place], [place] [address]  Lipid panel     Status: Abnormal   Collection Time: 07/13/23  6:45 PM  Result  Value Ref Range   Cholesterol 206 (H) 0 - 200 mg/dL   Triglycerides 94 <086 mg/dL   HDL 73 >57 mg/dL   Total CHOL/HDL Ratio 2.8 RATIO   VLDL 19 0 - 40 mg/dL   LDL Cholesterol 846 (H) 0 - 99 mg/dL    Comment:        Total Cholesterol/HDL:CHD Risk Coronary Heart Disease Risk Table                     Men   Women  1/2 Average Risk   3.4   3.3  Average Risk       5.0   4.4  2 X Average Risk   9.6   7.1  3 X Average Risk  23.4   11.0        Use the calculated Patient Ratio above and the CHD Risk Table to determine the patient's CHD Risk.        ATP III CLASSIFICATION (LDL):  <100     mg/dL   Optimal  962-952  mg/dL   Near or Above                    Optimal  130-159  mg/dL   Borderline  841-324  mg/dL   High  >401     mg/dL   Very High Performed at [facility], [address] [place], [place] [address]   RPR     Status: None   Collection Time: 07/13/23  6:45 PM  Result Value Ref Range   RPR Ser Ql NON REACTIVE NON REACTIVE    Comment: Performed at [facility], [address], [place], [place] [address]  TSH     Status: None  Collection Time: 07/13/23  6:45 PM  Result Value Ref Range   TSH 1.590 0.350 - 4.500 uIU/mL    Comment: Performed by a 3rd Generation assay with a functional sensitivity of <=0.01 uIU/mL. Performed at [facility] [address] [place], [place] [address]   Vitamin B12     Status: Abnormal   Collection Time: 07/13/23  6:45 PM  Result Value Ref Range   Vitamin B-12 2,773 (H) 180 - 914 pg/mL    Comment: RESULT CONFIRMED BY MANUAL DILUTION (NOTE) This assay is not validated for testing neonatal or myeloproliferative syndrome specimens for Vitamin B12 levels. Performed at [facility], [address] [place], [place] [address]   VITAMIN D  25 Hydroxy (Vit-D Deficiency, Fractures)     Status: None   Collection Time: 07/13/23  6:45 PM  Result Value Ref Range   Vit D, 25-Hydroxy 49.28  30 - 100 ng/mL    Comment: (NOTE) Vitamin D  deficiency has been defined by the Institute of Medicine  and an Endocrine Society practice guideline as a level of serum 25-OH  vitamin D  less than 20 ng/mL (1,2). The Endocrine Society went on to  further define vitamin D  insufficiency as a level between 21 and 29  ng/mL (2).  1. IOM (Institute of Medicine). 2010. Dietary reference intakes for  calcium  and D. [place]: [employer]. 2. Holick MF, Binkley [place], Bischoff-Ferrari HA, et al. Evaluation,  treatment, and prevention of vitamin D  deficiency: an Endocrine  Society clinical practice guideline, JCEM. 2011 Jul; 96(7): 1911-30.  Performed at [facility], [address], [place], [place] [address]     Blood Alcohol level:  Lab Results  Component Value Date   [facility] <15 07/09/2023   ETH <10 05/31/2023    Metabolic Disorder Labs: Lab Results  Component Value Date   HGBA1C 7.3 (H) 07/13/2023   MPG 162.81 07/13/2023   MPG 183 02/23/2022   No results found for: "PROLACTIN" Lab Results  Component Value Date   CHOL 206 (H) 07/13/2023   TRIG 94 07/13/2023   HDL 73 07/13/2023   CHOLHDL 2.8 07/13/2023   VLDL 19 07/13/2023   LDLCALC 114 (H) 07/13/2023   LDLCALC 137 (H) 02/22/2022    Physical Findings: AIMS:  , ,  ,  ,    CIWA:    COWS:     Musculoskeletal: Strength & Muscle Tone: within normal limits Gait & Station: normal Patient leans: N/A  Psychiatric Specialty Exam:  Presentation  General Appearance:  Appropriate for Environment  Eye Contact: Fair  Speech: Pressured  Speech Volume: Increased  Handedness:Right   Mood and Affect  Mood: Anxious; Irritable  Affect: Congruent; Tearful   Thought Process  Thought Processes: Disorganized  Descriptions of Associations:Tangential  Orientation:Partial  Thought Content:Illogical  History of Schizophrenia/Schizoaffective disorder:No data recorded Duration of Psychotic  Symptoms:No data recorded Hallucinations:Hallucinations: None  Ideas of Reference:None  Suicidal Thoughts:Suicidal Thoughts: No  Homicidal Thoughts:Homicidal Thoughts: No   Sensorium  Memory: Recent Good  Judgment: Poor  Insight: Poor   Executive Functions  Concentration: Fair  Attention Span: Fair  Recall: Good  Fund of Knowledge: Good  Language: Good   Psychomotor Activity  Psychomotor Activity: Psychomotor Activity: Restlessness   Assets  Assets: Communication Skills; Social Support; Housing   Sleep  Sleep: Sleep: Fair    Physical Exam: Physical Exam Constitutional:      Appearance: Normal appearance.  Pulmonary:     Effort: Pulmonary effort  is normal.  Neurological:     Mental Status: She is alert and oriented to person, place, and time.   Review of Systems  Psychiatric/Behavioral:  Negative for hallucinations and suicidal ideas. The patient does not have insomnia.    Blood pressure 111/87, pulse 88, temperature 98.9 F (37.2 C), temperature source Oral, resp. rate 18, height 5\' 6"  (1.676 m), weight 75.3 kg, SpO2 100%. Body mass index is 26.79 kg/m.   Treatment Plan Summary: Daily contact with patient to assess and evaluate symptoms and progress in treatment and Medication management  Pt continues to be resistant to tx and irritable. Today it is more clear that pt is having thought blocking. Pt can continue to benefit from SGA for mood stabilization and thought blocking and perseveration. Pt sleep is improving from collateral from husband.  Bipolar 1 d/o, current episode manic - Continue Risperdal  to 1mg  and 2mg  at bedtime - Invega Sustenna 234 mg administered on 5/26.  Will plan on administering Invega Sustenna 156 mg on 5/30. - Ramelteon 8mg  at bedtime   PRN -Tylenol  650mg  q6h, pain -Maalox 30ml q4h, indigestion -Atarax  25mg  TID, anxiety -Milk of Mag 30mL, constipation -Trazodone  50mg  QHS, insomnia  - flexeril  10mg  TID PO,  muscle pain (home medication)   Agitation Protocol Zyprexa  5mg  TID PO PRN  OR Zyprexa  5mg  IM TID PRN AND/OR    Hypokalemia- resolved    Safety and Monitoring: Involuntary admission to inpatient psychiatric unit for safety, stabilization and treatment Daily contact with patient to assess and evaluate symptoms and progress in treatment Patient's case to be discussed in multi-disciplinary team meeting Observation Level : q15 minute checks Vital signs: q12 hours Precautions: suicide, but pt currently verbally contracts for safety on unit    Discharge Planning: Social work and case management to assist with discharge planning and identification of hospital follow-up needs prior to discharge Estimated LOS: 1-2 days Discharge Concerns: Need to establish a safety plan; Medication compliance and effectiveness Discharge Goals: Return home with outpatient referrals for mental health follow-up including medication management/psychotherapy.   Jhamari Markowicz, MD 07/14/2023, 1:49 PM

## 2023-07-14 NOTE — BHH Group Notes (Signed)
 Adult Psychoeducational Group Note  Date:  07/14/2023 Time:  8:46 PM  Group Topic/Focus:  Wrap-Up Group:   The focus of this group is to help patients review their daily goal of treatment and discuss progress on daily workbooks.  Participation Level:  Active  Participation Quality:  Appropriate  Affect:  Appropriate  Cognitive:  Appropriate  Insight: Appropriate  Engagement in Group:  Engaged  Modes of Intervention:  Discussion and Support  Additional Comments:  Pt told that today was a good day on the unit, the highlight of which was looking forward to her upcoming discharge. "I'm very excited." On the subject of ways to stay well upon discharge, Laura Thompson mentioned wanting to better manage her stress through coping mechanisms. Pt rated her day a 10 out of 10.  Dal Dubin 07/14/2023, 8:46 PM

## 2023-07-14 NOTE — Progress Notes (Signed)
   07/14/23 1935  Psych Admission Type (Psych Patients Only)  Admission Status Involuntary  Psychosocial Assessment  Patient Complaints None  Eye Contact Fair  Facial Expression Animated  Affect Appropriate to circumstance  Speech Logical/coherent  Interaction Assertive  Motor Activity Other (Comment) (wnl)  Appearance/Hygiene Unremarkable  Behavior Characteristics Cooperative  Mood Pleasant  Thought Process  Coherency Circumstantial  Content WDL  Delusions None reported or observed  Perception WDL  Hallucination None reported or observed  Judgment WDL  Confusion None  Danger to Self  Current suicidal ideation? Denies  Agreement Not to Harm Self Yes  Description of Agreement verbal  Danger to Others  Danger to Others None reported or observed   Progress note   D: Pt seen in dayroom. Pt denies SI, HI, AVH. Pt rates pain  0/10. Pt rates anxiety  0/10 and depression  0/10. Pt says she is excited to be leaving tomorrow. 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 Pt says she is ready to get back to her life. Eating and sleeping well. Attending groups. No behavior issues on the unit. No other concerns noted at this time.  A: Pt provided support and encouragement. Pt given scheduled medication as prescribed. PRNs as appropriate. Q15 min checks for safety.   R: Pt safe on the unit. Will continue to monitor.

## 2023-07-14 NOTE — Plan of Care (Signed)
  Problem: Education: Goal: Emotional status will improve Outcome: Progressing Goal: Mental status will improve Outcome: Progressing   Problem: Activity: Goal: Interest or engagement in activities will improve Outcome: Progressing   Problem: Coping: Goal: Ability to verbalize frustrations and anger appropriately will improve Outcome: Progressing Goal: Ability to demonstrate self-control will improve Outcome: Progressing   Problem: Safety: Goal: Periods of time without injury will increase Outcome: Progressing

## 2023-07-15 ENCOUNTER — Other Ambulatory Visit: Payer: Self-pay

## 2023-07-15 ENCOUNTER — Other Ambulatory Visit (HOSPITAL_COMMUNITY): Payer: Self-pay

## 2023-07-15 MED ORDER — RISPERIDONE 1 MG PO TBDP
ORAL_TABLET | ORAL | 0 refills | Status: AC
  Filled 2023-07-15: qty 15, 8d supply, fill #0

## 2023-07-15 MED ORDER — RAMELTEON 8 MG PO TABS
8.0000 mg | ORAL_TABLET | Freq: Every day | ORAL | 0 refills | Status: AC
  Filled 2023-07-15: qty 30, 30d supply, fill #0

## 2023-07-15 MED ORDER — PALIPERIDONE PALMITATE ER 156 MG/ML IM SUSY
156.0000 mg | PREFILLED_SYRINGE | Freq: Once | INTRAMUSCULAR | 0 refills | Status: AC
  Filled 2023-07-15: qty 1, 1d supply, fill #0
  Filled 2023-07-17: qty 1, 28d supply, fill #0

## 2023-07-15 NOTE — Progress Notes (Signed)
 Discharge Note:  Patient denies SI/HI/AVH at this time. Discharge instructions, AVS, prescriptions, and transition record reviewed  with patient and she acknowledged an understanding. Patient agrees to comply with medication management, follow-up visit, and outpatient therapy. Patient belongings returned to patient. Patient questions and concerns addressed and answered. Patient ambulatory off unit @ 1145 and discharged  home to self care and transported by husband.

## 2023-07-15 NOTE — Progress Notes (Signed)
   07/15/23 0600  15 Minute Checks  Location Bedroom  Visual Appearance Calm  Behavior Sleeping  Sleep (Behavioral Health Patients Only)  Calculate sleep? (Click Yes once per 24 hr at 0600 safety check) Yes  Documented sleep last 24 hours 7.5

## 2023-07-15 NOTE — Plan of Care (Signed)
  Problem: Education: Goal: Knowledge of [facility] Education information/materials will improve Outcome: Progressing Goal: Emotional status will improve Outcome: Progressing Goal: Mental status will improve Outcome: Progressing Goal: Verbalization of understanding the information provided will improve Outcome: Progressing   Problem: Coping: Goal: Ability to verbalize frustrations and anger appropriately will improve Outcome: Progressing Goal: Ability to demonstrate self-control will improve Outcome: Progressing   Problem: Health Behavior/Discharge Planning: Goal: Identification of resources available to assist in meeting health care needs will improve Outcome: Progressing Goal: Compliance with treatment plan for underlying cause of condition will improve Outcome: Progressing   Problem: Physical Regulation: Goal: Ability to maintain clinical measurements within normal limits will improve Outcome: Progressing   Problem: Safety: Goal: Periods of time without injury will increase Outcome: Progressing   

## 2023-07-15 NOTE — Discharge Summary (Signed)
 Physician Discharge Summary Note  Patient:  Laura Thompson is an 48 y.o., female MRN:  [id number] DOB:  08-Sep-1975 Patient phone:  [phone] (home)  Patient address:   [address] [place] [address],  Total Time spent with patient: 1.5 hours  Date of Admission:  07/10/2023 Date of Discharge: 07/15/2023  Reason for Admission:  Bipolar 1 disorder, in acute episode  Principal Problem: Bipolar 1 disorder [facility]) Discharge Diagnoses: Principal Problem:   Bipolar 1 disorder (HCC)   Past Psychiatric History:  Previous Psych Diagnoses: Bipolar 1 disorder Prior inpatient treatment:last in 02/2022  for mania under IVC, at least total of five admission per chart review. Pr endorses 6 in her life Current Psychiatrist:  Last documented video visit with Todd Fossa, MD on 05/11/2021  History of suicide: Reported spinning a revolver and putting it to her head at the age of 23 or 45 History of homicide: Denies Psychiatric medication history: Abilify , lithium  450 mg, Risperdal  2 mg, Depakote , Zyprexa , trazodone , lamictal , seroquel , haldol , zyprexa  Pt reports that she did not think Abilify , Lithium , trazodone , not seroqel were helpful and would not take these medications. Pt endorsed feeling risperdal  as helpful and was willing to take this. Pt not really able to give true adverse reactions to these medications.   Psychiatric medication compliance history: Reported noncompliance  Past Medical History:  Past Medical History:  Diagnosis Date   Bipolar 1 disorder (HCC)    Diabetes mellitus without complication (HCC)     Past Surgical History:  Procedure Laterality Date   CERVICAL ABLATION     DENTAL SURGERY     lipoma removal     Family History:  Family History  Problem Relation Age of Onset   Thyroid disease Mother    Hypertension Father    Diabetes Father    Glaucoma Father    Social History:  Social History   Substance and Sexual Activity  Alcohol Use No     Social  History   Substance and Sexual Activity  Drug Use No    Social History   Socioeconomic History   Marital status: Married    Spouse name: Not on file   Number of children: Not on file   Years of education: Not on file   Highest education level: Not on file  Occupational History   Not on file  Tobacco Use   Smoking status: Never   Smokeless tobacco: Never  Vaping Use   Vaping status: Never Used  Substance and Sexual Activity   Alcohol use: No   Drug use: No   Sexual activity: Yes  Other Topics Concern   Not on file  Social History Narrative   Not on file   Social Drivers of Health   Financial Resource Strain: Low Risk  (05/30/2022)   Received from [facility]   Overall Financial Resource Strain (CARDIA)    Difficulty of Paying Living Expenses: Not very hard  Food Insecurity: No Food Insecurity (07/10/2023)   Hunger Vital Sign    Worried About Running Out of Food in the Last Year: Never true    Ran Out of Food in the Last Year: Never true  Transportation Needs: No Transportation Needs (07/10/2023)   PRAPARE - Administrator, Civil Service (Medical): No    Lack of Transportation (Non-Medical): No  Physical Activity: Insufficiently Active (05/30/2022)   Received from [facility]   Exercise Vital Sign    Days of Exercise per Week: 2 days  Minutes of Exercise per Session: 50 min  Stress: No Stress Concern Present (05/30/2022)   Received from [facility] - Occupational Stress Questionnaire    Feeling of Stress : Not at all  Social Connections: Socially Integrated (05/30/2022)   Received from [facility]   Social Network    How would you rate your social network (family, work, friends)?: Good participation with social networks    Hospital Course:   During the patient's hospitalization, patient had extensive initial psychiatric evaluation, and follow-up psychiatric evaluations every day.  Psychiatric  diagnoses provided upon initial assessment:    Bipolar 1 d/o, current episode manic   Patient's psychiatric medications were adjusted on admission:   Bipolar 1 d/o, current episode manic - Start Risperdal  2mg  at bedtime - D'c Depakote  500mg  daily from ED - D'c elavil  from ED - ramelteon 8mg  at bedtime   PRN -Tylenol  650mg  q6h, pain -Maalox 30ml q4h, indigestion -Atarax  25mg  TID, anxiety -Milk of Mag 30mL, constipation -Trazodone  50mg  QHS, insomnia   During the hospitalization, other adjustments were made to the patient's psychiatric medication regimen:   - Continue Risperdal  to 1mg  and 2mg  at bedtime - Invega Sustenna 234 mg administered on 5/26.  Will plan on administering Invega Sustenna 156 mg on 5/30. - Ramelteon 8mg  at bedtime  Patient's care was discussed during the interdisciplinary team meeting every day during the hospitalization.  The patient denied having side effects to prescribed psychiatric medication.  Gradually, patient started adjusting to milieu. The patient was evaluated each day by a clinical provider to ascertain response to treatment. Improvement was noted by the patient's report of decreasing symptoms, improved sleep and appetite, affect, medication tolerance, behavior, and participation in unit programming.  Patient was asked each day to complete a self inventory noting mood, mental status, pain, new symptoms, anxiety and concerns.   Symptoms were reported as significantly decreased or resolved completely by discharge.  The patient reports that their mood is stable.  The patient denied having suicidal thoughts for more than 48 hours prior to discharge.  Patient denies having homicidal thoughts.  Patient denies having auditory hallucinations.  Patient denies any visual hallucinations or other symptoms of psychosis.  The patient was motivated to continue taking medication with a goal of continued improvement in mental health.   The patient reports their  target psychiatric symptoms of mania responded well to the psychiatric medications, and the patient reports overall benefit other psychiatric hospitalization. Supportive psychotherapy was provided to the patient. The patient also participated in regular group therapy while hospitalized. Coping skills, problem solving as well as relaxation therapies were also part of the unit programming.  Labs were reviewed with the patient, and abnormal results were discussed with the patient.  The patient is able to verbalize their individual safety plan to this provider.  # It is recommended to the patient to continue psychiatric medications as prescribed, after discharge from the hospital.    # It is recommended to the patient to follow up with your outpatient psychiatric provider and PCP.  # It was discussed with the patient, the impact of alcohol, drugs, tobacco have been there overall psychiatric and medical wellbeing, and total abstinence from substance use was recommended the patient.ed.  # Prescriptions provided or sent directly to preferred pharmacy at discharge. Patient agreeable to plan. Given opportunity to ask questions. Appears to feel comfortable with discharge.    # In the event of worsening symptoms, the patient is instructed  to call the crisis hotline, 911 and or go to the nearest ED for appropriate evaluation and treatment of symptoms. To follow-up with primary care provider for other medical issues, concerns and or health care needs  # Patient was discharged home with husband with a plan to follow up as noted below.    On day of discharge patient in good spirits.  Appropriately bright.  Denied SI, HI, AVH.  Says that she was excited to go home.  Did not appear acutely manic.  Spoke with husband, he agrees to come pick her up.  We will be receiving long-acting injectable 6/2 at 4 PM at [facility].  Instructed to take Risperdal  pills until 6/2.  No new concerns.  No new medication side  effects.   Physical Findings: AIMS:  , ,  ,  ,    CIWA:    COWS:     Musculoskeletal: Strength & Muscle Tone: within normal limits Gait & Station: normal Patient leans: N/A   Psychiatric Specialty Exam:  Presentation  General Appearance:  Appropriate for Environment  Eye Contact: Fair  Speech: Pressured  Speech Volume: Increased  Handedness: Right   Mood and Affect  Mood: Anxious; Irritable  Affect: Congruent; Tearful   Thought Process  Thought Processes: Disorganized  Descriptions of Associations:Tangential  Orientation:Partial  Thought Content:Illogical  History of Schizophrenia/Schizoaffective disorder:No data recorded Duration of Psychotic Symptoms:No data recorded Hallucinations:No data recorded Ideas of Reference:None  Suicidal Thoughts:No data recorded Homicidal Thoughts:No data recorded  Sensorium  Memory: Recent Good  Judgment: Poor  Insight: Poor   Executive Functions  Concentration: Fair  Attention Span: Fair  Recall: Good  Fund of Knowledge: Good  Language: Good   Psychomotor Activity  Psychomotor Activity:No data recorded  Assets  Assets: Communication Skills; Social Support; Housing   Sleep  Sleep:No data recorded   Physical Exam: Physical Exam ROS Blood pressure 112/85, pulse 92, temperature 98.3 F (36.8 C), temperature source Oral, resp. rate 18, height 5\' 6"  (1.676 m), weight 75.3 kg, SpO2 100%. Body mass index is 26.79 kg/m.   Social History   Tobacco Use  Smoking Status Never  Smokeless Tobacco Never   Tobacco Cessation:  N/A, patient does not currently use tobacco products   Blood Alcohol level:  Lab Results  Component Value Date   [facility] <15 07/09/2023   ETH <10 05/31/2023    Metabolic Disorder Labs:  Lab Results  Component Value Date   HGBA1C 7.3 (H) 07/13/2023   MPG 162.81 07/13/2023   MPG 183 02/23/2022   No results found for: "PROLACTIN" Lab Results  Component  Value Date   CHOL 206 (H) 07/13/2023   TRIG 94 07/13/2023   HDL 73 07/13/2023   CHOLHDL 2.8 07/13/2023   VLDL 19 07/13/2023   LDLCALC 114 (H) 07/13/2023   LDLCALC 137 (H) 02/22/2022    See Psychiatric Specialty Exam and Suicide Risk Assessment completed by Attending Physician prior to discharge.  Discharge destination:  Home  Is patient on multiple antipsychotic therapies at discharge:  Yes,   Do you recommend tapering to monotherapy for antipsychotics?  Yes   Has Patient had three or more failed trials of antipsychotic monotherapy by history:  No  Recommended Plan for Multiple Antipsychotic Therapies: Patient's medications are in the process of a cross-taper;  medications include:  risperdal  to invega   Allergies as of 07/15/2023       Reactions   Aripiprazole  Other (See Comments)   Manic-like Sx happened in March 2019  Diphenhydramine Hives   Doxycycline Hives   Iodinated Contrast Media Hives   Iodine Hives   Sulfa Antibiotics Hives   Metformin And Related Diarrhea        Medication List     TAKE these medications      Indication  ascorbic acid 500 MG tablet Commonly known as: VITAMIN C  Take 500 mg by mouth daily.  Indication: Supplementation as desired   Cholecalciferol  62.5 MCG (2500 UT) Caps Take 5,000 Units by mouth daily.  Indication: Supplementation as desired   paliperidone 156 MG/ML Susy injection Commonly known as: INVEGA SUSTENNA Inject 1 mL (156 mg total) into the muscle once for 1 dose. Start taking on: July 20, 2023  Indication: Schizoaffective Disorder, Bipolar 1 Disorder   ramelteon 8 MG tablet Commonly known as: ROZEREM Take 1 tablet (8 mg total) by mouth at bedtime.  Indication: Trouble Sleeping   risperiDONE  1 MG disintegrating tablet Commonly known as: RISPERDAL  M-TABS Take 1 tablet (1 mg total) by mouth as directed. ** TAKE 1 mg (ONE PILL) IN THE MORNING AND 2 MG (TWO PILLS) AT NIGHT **  Indication: Manic Phase of  Manic-Depression   Trulicity  0.75 MG/0.5ML Soaj Generic drug: Dulaglutide  ADMINISTER 0.75 MG UNDER THE SKIN 1 TIME A WEEK  Indication: Type 2 Diabetes   TURMERIC CURCUMIN PO Take by mouth.  Indication: Supplementation as desired   zinc  gluconate 50 MG tablet Take 50 mg by mouth daily.  Indication: Supplementation as desired        Follow-up Information     [facility] Follow up on 07/23/2023.   Why: You have a hospital follow up appointment for therapy and medication management services on 07/23/23 at 8:00 am .  The appointment will be Virtual telehealth.  IF YOU HAVE ANY QUESTIONS OR CONCERNS PLEASE CALL - [phone] Contact information: [address]  [address] [place] [address] [phone]         [facility] Go on 07/20/2023.   Why: You have to schedule an appointment on 07/20/23 at 4:30 pm  for interim medication management services.  The appointment will be held in person.  * YOU MUST PICK UP YOUR MEDICATION FROM THE PHARMACY AND BRING WITH YOU TO THIS APPT, TO GET YOUR INJECTION. Contact information: [address] [place] [address] [phone]                 Follow-up recommendations, comments:   Activity: as tolerated  Diet: heart healthy  Other: -Follow-up with your outpatient psychiatric provider -instructions on appointment date, time, and address (location) are provided to you in discharge paperwork.  -Take your psychiatric medications as prescribed at discharge - instructions are provided to you in the discharge paperwork  -Follow-up with outpatient primary care doctor and other specialists -for management of chronic medical disease, including: High cholesterol, T2DM  -Testing: Follow-up with outpatient provider for abnormal lab results: A1c 7.3, LDL 114  -Recommend abstinence from alcohol, tobacco, and other illicit drug use at discharge.   -If your psychiatric symptoms recur, worsen, or if you have side effects  to your psychiatric medications, call your outpatient psychiatric provider, 911, [phone] or go to the nearest emergency department.  -If suicidal thoughts recur, call your outpatient psychiatric provider, 911, [phone] or go to the nearest emergency department.   Signed: Darleny Sem, MD 07/15/2023, 10:28 AM

## 2023-07-15 NOTE — [facility] Suicide Risk Assessment (Signed)
 [facility] Discharge Suicide Risk Assessment   Principal Problem: Bipolar 1 disorder [facility]) Discharge Diagnoses: Principal Problem:   Bipolar 1 disorder (HCC)   Total Time spent with patient: 2 hours  Laura Thompson is a 48 yo patient w/ a PPH of Bipolar d/o who presented to [facility] via her husband w/ concern for mania and IVC'd by husband. During ED time pt received IM Geodon  20mg  and Depakote  500mg . Pt transferred to [facility].   During the patient's hospitalization, patient had extensive initial psychiatric evaluation, and follow-up psychiatric evaluations every day.   Psychiatric diagnoses provided upon initial assessment:    Bipolar 1 d/o, current episode manic    Patient's psychiatric medications were adjusted on admission:    Bipolar 1 d/o, current episode manic - Start Risperdal  2mg  at bedtime - D'c Depakote  500mg  daily from ED - D'c elavil  from ED - ramelteon  8mg  at bedtime   PRN -Tylenol  650mg  q6h, pain -Maalox 30ml q4h, indigestion -Atarax  25mg  TID, anxiety -Milk of Mag 30mL, constipation -Trazodone  50mg  QHS, insomnia    During the hospitalization, other adjustments were made to the patient's psychiatric medication regimen:    - Continue Risperdal  to 1mg  and 2mg  at bedtime - Invega  Sustenna 234 mg administered on 5/26.  Will plan on administering Invega  Sustenna 156 mg on 5/30. - Ramelteon  8mg  at bedtime   Patient's care was discussed during the interdisciplinary team meeting every day during the hospitalization.   The patient denied having side effects to prescribed psychiatric medication.   Gradually, patient started adjusting to milieu. The patient was evaluated each day by a clinical provider to ascertain response to treatment. Improvement was noted by the patient's report of decreasing symptoms, improved sleep and appetite, affect, medication tolerance, behavior, and participation in unit programming.  Patient was asked each day to complete a self inventory noting mood,  mental status, pain, new symptoms, anxiety and concerns.   Symptoms were reported as significantly decreased or resolved completely by discharge.  The patient reports that their mood is stable.  The patient denied having suicidal thoughts for more than 48 hours prior to discharge.  Patient denies having homicidal thoughts.  Patient denies having auditory hallucinations.  Patient denies any visual hallucinations or other symptoms of psychosis.  The patient was motivated to continue taking medication with a goal of continued improvement in mental health.    The patient reports their target psychiatric symptoms of mania responded well to the psychiatric medications, and the patient reports overall benefit other psychiatric hospitalization. Supportive psychotherapy was provided to the patient. The patient also participated in regular group therapy while hospitalized. Coping skills, problem solving as well as relaxation therapies were also part of the unit programming.   Labs were reviewed with the patient, and abnormal results were discussed with the patient.   The patient is able to verbalize their individual safety plan to this provider.   # It is recommended to the patient to continue psychiatric medications as prescribed, after discharge from the hospital.     # It is recommended to the patient to follow up with your outpatient psychiatric provider and PCP.   # It was discussed with the patient, the impact of alcohol, drugs, tobacco have been there overall psychiatric and medical wellbeing, and total abstinence from substance use was recommended the patient.ed.   # Prescriptions provided or sent directly to preferred pharmacy at discharge. Patient agreeable to plan. Given opportunity to ask questions. Appears to feel comfortable with discharge.    #  In the event of worsening symptoms, the patient is instructed to call the crisis hotline, 911 and or go to the nearest ED for appropriate evaluation  and treatment of symptoms. To follow-up with primary care provider for other medical issues, concerns and or health care needs   # Patient was discharged home with husband with a plan to follow up as noted below.   Musculoskeletal: Strength & Muscle Tone: within normal limits Gait & Station: normal Patient leans: N/A  Psychiatric Specialty Exam:   Presentation  General Appearance:  Appropriate for Environment   Eye Contact: Fair   Speech: Pressured   Speech Volume: Increased   Handedness:Right     Mood and Affect  Mood: Anxious; Irritable   Affect: Congruent; Tearful     Thought Process  Thought Processes: Disorganized   Descriptions of Associations:Tangential   Orientation:Partial   Thought Content:Illogical   History of Schizophrenia/Schizoaffective disorder:No data recorded Duration of Psychotic Symptoms:No data recorded Hallucinations:Hallucinations: None   Ideas of Reference:None   Suicidal Thoughts:Suicidal Thoughts: No   Homicidal Thoughts:Homicidal Thoughts: No     Sensorium  Memory: Recent Good   Judgment: Poor   Insight: Poor     Executive Functions  Concentration: Fair   Attention Span: Fair   Recall: Good   Fund of Knowledge: Good   Language: Good     Psychomotor Activity  Psychomotor Activity: Psychomotor Activity: Restlessness     Assets  Assets: Communication Skills; Social Support; Housing     Sleep  Sleep: Sleep: Fair       Physical Exam: Physical Exam Constitutional:      Appearance: Normal appearance.  Pulmonary:     Effort: Pulmonary effort is normal.  Neurological:     Mental Status: She is alert and oriented to person, place, and time.    Review of Systems  Psychiatric/Behavioral:  Negative for hallucinations and suicidal ideas. The patient does not have insomnia.    Blood pressure 112/85, pulse 92, temperature 98.3 F (36.8 C), temperature source Oral, resp. rate 18, height 5\' 6"   (1.676 m), weight 75.3 kg, SpO2 100%. Body mass index is 26.79 kg/m.  Mental Status Per Nursing Assessment::   On Admission:  NA  Demographic factors:  Low socioeconomic status Current Mental Status:  NA Loss Factors:  NA Historical Factors:  NA Risk Reduction Factors:  Living with another person, especially a relative  Continued Clinical Symptoms:  N/A  Cognitive Features That Contribute To Risk:  N/A  Suicide Risk:  Minimal: There are no identifiable suicide plans, no associated intent, no dysphoria and related symptoms, good self-control (both objective and subjective assessment), few other risk factors, and identifiable protective factors, including available and accessible social support.   Follow-up Information     [facility] Follow up on 07/23/2023.   Why: You have a hospital follow up appointment for therapy and medication management services on 07/23/23 at 8:00 am .  The appointment will be Virtual telehealth. Contact information: [address]  [address] [place] [address] [phone]                 Plan Of Care/Follow-up recommendations:  Activity: as tolerated   Diet: heart healthy   Other: -Follow-up with your outpatient psychiatric provider -instructions on appointment date, time, and address (location) are provided to you in discharge paperwork.   -Take your psychiatric medications as prescribed at discharge - instructions are provided to you in the discharge paperwork   -Follow-up with outpatient primary care doctor  and other specialists -for management of chronic medical disease, including: High cholesterol, T2DM   -Testing: Follow-up with outpatient provider for abnormal lab results: A1c 7.3, LDL 114   -Recommend abstinence from alcohol, tobacco, and other illicit drug use at discharge.    -If your psychiatric symptoms recur, worsen, or if you have side effects to your psychiatric medications, call your outpatient psychiatric provider, 911,  [phone] or go to the nearest emergency department.   -If suicidal thoughts recur, call your outpatient psychiatric provider, 911, [phone] or go to the nearest emergency department.   Royale Swamy, MD 07/15/2023, 7:03 AM

## 2023-07-15 NOTE — Progress Notes (Signed)
 Pharmacy Patient Advocate Encounter  Insurance verification completed.   The patient is insured through [employer] [facility]   Ran test claim for Invega Sustenna. C-pay is $45.   This test claim was processed through [facility]- copay amounts may vary at other pharmacies due to pharmacy/plan contracts, or as the patient moves through the different stages of their insurance plan.

## 2023-07-15 NOTE — Plan of Care (Signed)
  Problem: Education: Goal: Emotional status will improve Outcome: Progressing Goal: Mental status will improve Outcome: Progressing Goal: Verbalization of understanding the information provided will improve Outcome: Progressing   Problem: Activity: Goal: Interest or engagement in activities will improve Outcome: Progressing Goal: Sleeping patterns will improve Outcome: Progressing   Problem: Coping: Goal: Ability to demonstrate self-control will improve Outcome: Progressing   Problem: Health Behavior/Discharge Planning: Goal: Compliance with treatment plan for underlying cause of condition will improve Outcome: Progressing   Problem: Safety: Goal: Periods of time without injury will increase Outcome: Progressing

## 2023-07-15 NOTE — Progress Notes (Signed)
  [facility] Adult Case Management Discharge Plan :  Will you be returning to the same living situation after discharge:  Yes,  patient will return home with her husband, Laura Thompson (husband) At discharge, do you have transportation home?: Yes,  patient's husband will pick her up at 11:00 AM Do you have the ability to pay for your medications: Yes,  patient has insurance  Release of information consent forms completed and in the chart;  Patient's signature needed at discharge.  Patient to Follow up at:  Follow-up Information     [facility] Follow up on 07/23/2023.   Why: You have a hospital follow up appointment for therapy and medication management services on 07/23/23 at 8:00 am .  The appointment will be Virtual telehealth.  IF YOU HAVE ANY QUESTIONS OR CONCERNS PLEASE CALL - [phone] Contact information: [address]  [address] [place] [address] [phone]         [facility] Follow up on 07/17/2023.   Why: This provider will call you on 07/17/23 at 9:00 am to schedule an appointment for interim medication management services.  The appointment will be held in person.  * YOU MUST PICK UP YOUR MEDICATION FROM THE PHARMACY AND BRING WITH YOU TO THIS APPT, TO GET YOUR INJECTION. Contact information: [address] [place] [address] [phone]                 Next level of care provider has access to [facility] Link:no  Safety Planning and Suicide Prevention discussed: Yes,  Laura Thompson (husband) [phone]  Has patient been referred to the Quitline?: Patient does not use tobacco/nicotine  products  Patient has been referred for addiction treatment: No known substance use disorder.   Laura Thompson, LCSWA 07/15/2023, 9:37 AM

## 2023-07-16 ENCOUNTER — Other Ambulatory Visit (HOSPITAL_COMMUNITY): Payer: Self-pay

## 2023-07-17 ENCOUNTER — Other Ambulatory Visit: Payer: Self-pay

## 2023-07-17 NOTE — Progress Notes (Signed)
 Specialty Pharmacy Initial Fill Coordination Note  Laura Thompson is a 48 y.o. female contacted today regarding initial fill of specialty medication(s) Paliperidone Palmitate (INVEGA SUSTENNA)   Patient requested Delivery   Delivery date: 07/20/23   Verified address: [facility] [address]   Medication will be filled on 5/30.   Patient is aware of $45 copayment.

## 2023-07-20 ENCOUNTER — Other Ambulatory Visit: Payer: Self-pay

## 2023-07-20 DIAGNOSIS — F3162 Bipolar disorder, current episode mixed, moderate: Secondary | ICD-10-CM | POA: Diagnosis not present

## 2023-07-20 DIAGNOSIS — F413 Other mixed anxiety disorders: Secondary | ICD-10-CM | POA: Diagnosis not present

## 2023-07-28 ENCOUNTER — Other Ambulatory Visit (HOSPITAL_COMMUNITY): Payer: Self-pay

## 2023-07-29 ENCOUNTER — Other Ambulatory Visit: Payer: Self-pay | Admitting: Family Medicine

## 2023-07-29 DIAGNOSIS — E119 Type 2 diabetes mellitus without complications: Secondary | ICD-10-CM

## 2023-08-04 ENCOUNTER — Other Ambulatory Visit: Payer: Self-pay

## 2023-08-06 ENCOUNTER — Other Ambulatory Visit: Payer: Self-pay

## 2023-08-06 ENCOUNTER — Other Ambulatory Visit: Payer: Self-pay | Admitting: Pharmacy Technician

## 2023-08-06 ENCOUNTER — Other Ambulatory Visit (HOSPITAL_COMMUNITY): Payer: Self-pay

## 2023-08-06 NOTE — Progress Notes (Signed)
 Dis-enrolling patient from program on 08/06/23. Patient reported not taking Invenga & no longer seeing MD. Patient reported she have no intentions on taking Invega  anytime in the near future. Ok'd and confirm by [facility]; Rph.

## 2023-08-19 DIAGNOSIS — F3162 Bipolar disorder, current episode mixed, moderate: Secondary | ICD-10-CM | POA: Diagnosis not present

## 2023-08-19 DIAGNOSIS — F413 Other mixed anxiety disorders: Secondary | ICD-10-CM | POA: Diagnosis not present

## 2023-08-20 DIAGNOSIS — E1169 Type 2 diabetes mellitus with other specified complication: Secondary | ICD-10-CM | POA: Diagnosis not present

## 2023-08-20 DIAGNOSIS — F419 Anxiety disorder, unspecified: Secondary | ICD-10-CM | POA: Diagnosis not present

## 2023-08-20 DIAGNOSIS — Z79899 Other long term (current) drug therapy: Secondary | ICD-10-CM | POA: Diagnosis not present

## 2023-08-20 DIAGNOSIS — F319 Bipolar disorder, unspecified: Secondary | ICD-10-CM | POA: Diagnosis not present

## 2023-09-02 DIAGNOSIS — E1169 Type 2 diabetes mellitus with other specified complication: Secondary | ICD-10-CM | POA: Diagnosis not present

## 2023-09-02 DIAGNOSIS — F319 Bipolar disorder, unspecified: Secondary | ICD-10-CM | POA: Diagnosis not present

## 2023-09-02 DIAGNOSIS — G479 Sleep disorder, unspecified: Secondary | ICD-10-CM | POA: Diagnosis not present

## 2023-09-16 DIAGNOSIS — F3162 Bipolar disorder, current episode mixed, moderate: Secondary | ICD-10-CM | POA: Diagnosis not present

## 2023-09-16 DIAGNOSIS — F413 Other mixed anxiety disorders: Secondary | ICD-10-CM | POA: Diagnosis not present

## 2023-10-14 DIAGNOSIS — F3162 Bipolar disorder, current episode mixed, moderate: Secondary | ICD-10-CM | POA: Diagnosis not present

## 2023-10-14 DIAGNOSIS — F413 Other mixed anxiety disorders: Secondary | ICD-10-CM | POA: Diagnosis not present

## 2023-11-17 DIAGNOSIS — Z0000 Encounter for general adult medical examination without abnormal findings: Secondary | ICD-10-CM | POA: Diagnosis not present

## 2023-11-17 DIAGNOSIS — E1169 Type 2 diabetes mellitus with other specified complication: Secondary | ICD-10-CM | POA: Diagnosis not present

## 2023-11-17 DIAGNOSIS — Z23 Encounter for immunization: Secondary | ICD-10-CM | POA: Diagnosis not present

## 2023-11-17 DIAGNOSIS — F319 Bipolar disorder, unspecified: Secondary | ICD-10-CM | POA: Diagnosis not present

## 2023-11-21 DIAGNOSIS — Z1231 Encounter for screening mammogram for malignant neoplasm of breast: Secondary | ICD-10-CM | POA: Diagnosis not present

## 2023-12-02 DIAGNOSIS — F413 Other mixed anxiety disorders: Secondary | ICD-10-CM | POA: Diagnosis not present

## 2023-12-02 DIAGNOSIS — F3162 Bipolar disorder, current episode mixed, moderate: Secondary | ICD-10-CM | POA: Diagnosis not present

## 2024-01-06 DIAGNOSIS — F3162 Bipolar disorder, current episode mixed, moderate: Secondary | ICD-10-CM | POA: Diagnosis not present

## 2024-01-06 DIAGNOSIS — F413 Other mixed anxiety disorders: Secondary | ICD-10-CM | POA: Diagnosis not present
